# Patient Record
Sex: Male | Born: 1950
Health system: Southern US, Community
[De-identification: ages and names within clinical notes are randomized; demographics above are authoritative.]

## PROBLEM LIST (undated history)

## (undated) DIAGNOSIS — C801 Malignant (primary) neoplasm, unspecified: Secondary | ICD-10-CM

## (undated) DIAGNOSIS — G4730 Sleep apnea, unspecified: Secondary | ICD-10-CM

## (undated) DIAGNOSIS — I10 Essential (primary) hypertension: Secondary | ICD-10-CM

## (undated) DIAGNOSIS — J309 Allergic rhinitis, unspecified: Secondary | ICD-10-CM

## (undated) DIAGNOSIS — E119 Type 2 diabetes mellitus without complications: Secondary | ICD-10-CM

## (undated) HISTORY — PX: FRACTURE SURGERY: SHX138

## (undated) HISTORY — PX: OTHER SURGICAL HISTORY: SHX169

## (undated) HISTORY — DX: Sleep apnea, unspecified: G47.30

## (undated) HISTORY — DX: Allergic rhinitis, unspecified: J30.9

## (undated) HISTORY — PX: TONSILLECTOMY: SUR1361

## (undated) HISTORY — DX: Essential (primary) hypertension: I10

---

## 2006-10-09 LAB — HM COLONOSCOPY

## 2007-03-25 ENCOUNTER — Encounter: Payer: Self-pay | Admitting: Family Medicine

## 2007-08-24 ENCOUNTER — Ambulatory Visit: Payer: Self-pay | Admitting: Family Medicine

## 2007-08-24 DIAGNOSIS — J309 Allergic rhinitis, unspecified: Secondary | ICD-10-CM | POA: Insufficient documentation

## 2007-09-08 ENCOUNTER — Ambulatory Visit: Payer: Self-pay | Admitting: Family Medicine

## 2007-09-08 DIAGNOSIS — E1159 Type 2 diabetes mellitus with other circulatory complications: Secondary | ICD-10-CM | POA: Insufficient documentation

## 2007-09-08 DIAGNOSIS — I10 Essential (primary) hypertension: Secondary | ICD-10-CM

## 2007-09-08 LAB — CONVERTED CEMR LAB
Bilirubin Urine: NEGATIVE
Glucose, Urine, Semiquant: NEGATIVE
Ketones, urine, test strip: NEGATIVE
Specific Gravity, Urine: 1.015
Urobilinogen, UA: NEGATIVE
pH: 6.5

## 2007-09-18 ENCOUNTER — Ambulatory Visit: Payer: Self-pay | Admitting: Family Medicine

## 2007-09-25 ENCOUNTER — Ambulatory Visit: Payer: Self-pay | Admitting: Gastroenterology

## 2007-10-09 ENCOUNTER — Encounter: Payer: Self-pay | Admitting: Family Medicine

## 2007-10-09 ENCOUNTER — Encounter: Payer: Self-pay | Admitting: Gastroenterology

## 2007-10-09 ENCOUNTER — Ambulatory Visit: Payer: Self-pay | Admitting: Gastroenterology

## 2007-12-16 ENCOUNTER — Ambulatory Visit: Payer: Self-pay | Admitting: Family Medicine

## 2008-01-06 ENCOUNTER — Ambulatory Visit: Payer: Self-pay | Admitting: Pulmonary Disease

## 2008-01-17 ENCOUNTER — Ambulatory Visit (HOSPITAL_BASED_OUTPATIENT_CLINIC_OR_DEPARTMENT_OTHER): Admission: RE | Admit: 2008-01-17 | Discharge: 2008-01-17 | Payer: Self-pay | Admitting: Pulmonary Disease

## 2008-01-17 ENCOUNTER — Encounter: Payer: Self-pay | Admitting: Pulmonary Disease

## 2008-01-19 ENCOUNTER — Ambulatory Visit: Payer: Self-pay | Admitting: Pulmonary Disease

## 2008-01-21 ENCOUNTER — Telehealth: Payer: Self-pay | Admitting: Pulmonary Disease

## 2008-01-26 ENCOUNTER — Ambulatory Visit: Payer: Self-pay | Admitting: Pulmonary Disease

## 2008-01-26 ENCOUNTER — Ambulatory Visit: Payer: Self-pay | Admitting: Family Medicine

## 2008-01-26 DIAGNOSIS — G2589 Other specified extrapyramidal and movement disorders: Secondary | ICD-10-CM

## 2008-01-26 DIAGNOSIS — G4733 Obstructive sleep apnea (adult) (pediatric): Secondary | ICD-10-CM | POA: Insufficient documentation

## 2008-01-28 ENCOUNTER — Encounter: Payer: Self-pay | Admitting: Pulmonary Disease

## 2008-02-15 ENCOUNTER — Encounter: Payer: Self-pay | Admitting: Pulmonary Disease

## 2008-03-15 ENCOUNTER — Encounter: Payer: Self-pay | Admitting: Pulmonary Disease

## 2008-03-28 ENCOUNTER — Encounter (INDEPENDENT_AMBULATORY_CARE_PROVIDER_SITE_OTHER): Payer: Self-pay | Admitting: *Deleted

## 2008-03-30 ENCOUNTER — Ambulatory Visit: Payer: Self-pay | Admitting: Family Medicine

## 2008-06-16 ENCOUNTER — Ambulatory Visit: Payer: Self-pay | Admitting: Family Medicine

## 2008-06-20 ENCOUNTER — Ambulatory Visit: Payer: Self-pay | Admitting: Family Medicine

## 2008-06-20 LAB — CONVERTED CEMR LAB
ALT: 23 units/L (ref 0–53)
Albumin: 3.7 g/dL (ref 3.5–5.2)
Alkaline Phosphatase: 37 units/L — ABNORMAL LOW (ref 39–117)
BUN: 13 mg/dL (ref 6–23)
Bilirubin, Direct: 0.1 mg/dL (ref 0.0–0.3)
CO2: 29 meq/L (ref 19–32)
Calcium: 8.9 mg/dL (ref 8.4–10.5)
Cholesterol: 164 mg/dL (ref 0–200)
Glucose, Bld: 92 mg/dL (ref 70–99)
HDL: 34.8 mg/dL — ABNORMAL LOW (ref >39.0)
Sodium: 144 meq/L (ref 135–145)
Total Protein: 7.4 g/dL (ref 6.0–8.3)

## 2009-09-04 ENCOUNTER — Telehealth: Payer: Self-pay | Admitting: Family Medicine

## 2009-09-12 ENCOUNTER — Ambulatory Visit: Payer: Self-pay | Admitting: Family Medicine

## 2009-09-19 ENCOUNTER — Encounter: Payer: Self-pay | Admitting: Family Medicine

## 2009-09-19 ENCOUNTER — Ambulatory Visit: Payer: Self-pay | Admitting: Family Medicine

## 2009-09-19 DIAGNOSIS — R5383 Other fatigue: Secondary | ICD-10-CM

## 2009-09-19 DIAGNOSIS — R5381 Other malaise: Secondary | ICD-10-CM

## 2009-09-20 LAB — CONVERTED CEMR LAB
ALT: 21 units/L (ref 0–53)
AST: 24 units/L (ref 0–37)
Alkaline Phosphatase: 48 units/L (ref 39–117)
Basophils Relative: 0.7 % (ref 0.0–3.0)
Bilirubin, Direct: 0.1 mg/dL (ref 0.0–0.3)
Chloride: 110 meq/L (ref 96–112)
Cholesterol: 168 mg/dL (ref 0–200)
Eosinophils Absolute: 0.1 10*3/uL (ref 0.0–0.7)
LDL Cholesterol: 107 mg/dL — ABNORMAL HIGH (ref 0–99)
MCHC: 32.7 g/dL (ref 30.0–36.0)
MCV: 89.8 fL (ref 78.0–100.0)
Monocytes Absolute: 0.5 10*3/uL (ref 0.1–1.0)
Neutrophils Relative %: 49 % (ref 43.0–77.0)
PSA: 1.5 ng/mL (ref 0.10–4.00)
Platelets: 245 10*3/uL (ref 150.0–400.0)
Potassium: 4 meq/L (ref 3.5–5.1)
RDW: 12.4 % (ref 11.5–14.6)
Total Bilirubin: 0.6 mg/dL (ref 0.3–1.2)
Total CHOL/HDL Ratio: 3
Vitamin B-12: 349 pg/mL (ref 211–911)

## 2010-08-14 NOTE — Progress Notes (Signed)
Summary: call a nurse   Phone Note Call from Patient   Caller: Patient Call For: Kerby Nora MD Summary of Call: Triage Record Num: [id number] Operator: Karenann Cai Patient Name: Chase Moreno Call Date & Time: 09/02/2009 8:44:28AM Patient Phone: [phone] PCP: Kerby Nora Patient Gender: Male PCP Fax : Patient DOB: 1950-11-08 Practice Name: [facility] Reason for Call: Patient has flu-like symptoms: body aches, cough and fever. Fever: ? Onset of symptoms: 48 hours. RN reviewed care advise with patient and advised him to go to UC for evaluation this am. Protocol(s) Used: Flu-Like Symptoms; Known/Suspected Influenza Recommended Outcome per Protocol: See Provider within 4 hours Reason for Outcome: Known influenza outbreak and has had symptoms consistent with influenza less than 48 hours (sudden onset of generalized headache, muscles aching, generalized ill feeling, loss of appetite, nasal congestion, runny nose, sore throat or cough) Care Advice:  ~ Use a cool mist humidifier to moisten air. Be sure to clean according to manufacturer's instructions. Drink 6-10 eight ounce glasses (1.2-2.0 liters) of fluids per day unless previously instructed to restrict fluid intake for other medical reasons. Limit fluids that contain caffeine, sugar or alcohol.  ~ Consider acetaminophen as directed on label or by pharmacist/provider for pain or fever. PRECAUTIONS: - Use only If there is no history of liver disease, alcoholism, or intake of three or more alcohol drinks per day. - If approved by provider when breastfeeding. - Do not exceed recommended dose or frequency.  ~ Respiratory Hygiene: - Cover the nose/mouth tightly with a tissue when coughing or sneezing. - Use tissue 1 time and discard in the nearest waste receptacle. - Wash hands with soap and water or alcohol-based hand rub after coming into contact with respiratory secretions and contaminated objects/materials. -  Alternatively when no tissue is available, cough into the bend of the elbow.  ~ 02 Initial call taken by: Melody Comas,  September 04, 2009 10:49 AM

## 2010-08-14 NOTE — Assessment & Plan Note (Signed)
Summary: CPX.Marland KitchenMarland KitchenCYD   Vital Signs:  Patient profile:   60 year old male Height:      67 inches Weight:      188.8 pounds BMI:     29.68 Temp:     98.0 degrees F oral Pulse rate:   72 / minute Pulse rhythm:   regular BP sitting:   110 / 78  (left arm) Cuff size:   regular  Vitals Entered By: Benny Lennert CMA Duncan Dull) (September 12, 2009 11:28 AM)  History of Present Illness: Chief complaint cpx  The patient is here for annual wellness exam and preventative care.    Was not here because lost job, now [employer].   Third Shift job now.  Did have flu 2 weeks ago..since then has been tired.  Was given tamiflu though. eating and drinking well.   Hypertension History:      He denies headache, chest pain, dyspnea with exertion, peripheral edema, neurologic problems, syncope, and side effects from treatment.  Well controlled at home. Marland Kitchen        Positive major cardiovascular risk factors include male age 75 years old or older and hypertension.  Negative major cardiovascular risk factors include no history of diabetes or hyperlipidemia, negative family history for ischemic heart disease, and non-tobacco-user status.        Further assessment for target organ damage reveals no history of ASHD, cardiac end-organ damage (CHF/LVH), stroke/TIA, peripheral vascular disease, renal insufficiency, or hypertensive retinopathy.     Problems Prior to Update: 1)  Special Screening Malignant Neoplasm of Prostate  (ICD-V76.44) 2)  Periodic Limb Movement Disorder  (ICD-333.99) 3)  Obstructive Sleep Apnea  (ICD-780.57) 4)  Healthy Adult Male  (ICD-V70.0) 5)  Hypertension  (ICD-401.9) 6)  Allergic Rhinitis  (ICD-477.9)  Current Medications (verified): 1)  Hydrochlorothiazide 25 Mg  Tabs (Hydrochlorothiazide) .... Take 1 Tablet By Mouth Once A Day 2)  Adult Aspirin Ec Low Strength 81 Mg  Tbec (Aspirin) .... Take One Tablet Daily. 3)  Fish Oil   Oil (Fish Oil) .... Take One Tablet Daily. 4)  Multivitamins    Tabs (Multiple Vitamin) .... Take 1 Tablet By Mouth Once A Day  Allergies (verified): No Known Drug Allergies  Past History:  Past medical, surgical, family and social histories (including risk factors) reviewed, and no changes noted (except as noted below).  Past Medical History: Reviewed history from 01/26/2008 and no changes required. SLEEP APNEA, CHRONIC (ICD-780.57) HYPERTENSION (ICD-401.9) ALLERGIC RHINITIS (ICD-477.9)  Past Surgical History: Reviewed history from 08/24/2007 and no changes required. 1984 broken arm, left Tonsillectomy  Family History: Reviewed history from 08/24/2007 and no changes required. father: ? health mother:arhtritis, heart arrythmia raised by grandparents ZOX:WRUE at young age MGF:CVA age 27 uncle: colon cancer  Social History: Reviewed history from 09/08/2007 and no changes required. 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 CV:  Denies chest pain or discomfort. Resp:  Denies shortness of breath. GI:  Denies abdominal pain. GU:  Denies dysuria. Derm:  Denies lesion(s). Psych:  Denies anxiety and depression.  Physical Exam  General:  Overweight male in NAD Ears:  External ear exam shows no significant lesions or deformities.  Otoscopic examination reveals clear canals, tympanic membranes are intact bilaterally without bulging, retraction, inflammation or discharge. Hearing is grossly normal bilaterally. Nose:  External nasal examination shows no  deformity or inflammation. Nasal mucosa are pink and moist without lesions or exudates. Mouth:  Oral mucosa and oropharynx without lesions or exudates.  Teeth in good repair. MMM Neck:  no carotid bruit or thyromegaly no cervical or supraclavicular lymphadenopathy  Lungs:  Normal  respiratory effort, chest expands symmetrically. Lungs are clear to auscultation, no crackles or wheezes. Heart:  Normal rate and regular rhythm. S1 and S2 normal without gallop, murmur, click, rub or other extra sounds. Abdomen:  Bowel sounds positive,abdomen soft and non-tender without masses, organomegaly or hernias noted. Rectal:  No external abnormalities noted. Normal sphincter tone. No rectal masses or tenderness. Genitalia:  Testes bilaterally descended without nodularity, tenderness or masses. No scrotal masses or lesions. No penis lesions or urethral discharge. Msk:  No deformity or scoliosis noted of thoracic or lumbar spine.   Pulses:  R and L posterior tibial pulses are full and equal bilaterally  Extremities:  no edema Neurologic:  No cranial nerve deficits noted. Station and gait are normal. Plantar reflexes are down-going bilaterally. DTRs are symmetrical throughout. Sensory, motor and coordinative functions appear intact. Skin:  Intact without suspicious lesions or rashes Psych:  Cognition and judgment appear intact. Alert and cooperative with normal attention span and concentration. No apparent delusions, illusions, hallucinations   Impression & Recommendations:  Problem # 1:  HEALTHY ADULT MALE (ICD-V70.0) The patient's preventative maintenance and recommended screening tests for an annual wellness exam were reviewed in full today. Brought up to date unless services declined.  Counselled on the importance of diet, exercise, and its role in overall health and mortality. The patient's FH and SH was reviewed, including their home life, tobacco status, and drug and alcohol status.     Problem # 2:  HYPERTENSION (ICD-401.9)  Well controlled oncurrrent medication.  His updated medication list for this problem includes:    Hydrochlorothiazide 25 Mg Tabs (Hydrochlorothiazide) .Marland Kitchen... Take 1 tablet by mouth once a day  BP today: 110/78 Prior BP: 126/76 (06/20/2008)  10 Yr Risk  Heart Disease: 14 % Prior 10 Yr Risk Heart Disease: Not enough information (09/18/2007)  Labs Reviewed: K+: 4.2 (06/16/2008) Creat: : 1.1 (06/16/2008)   Chol: 164 (06/16/2008)   HDL: 34.8 (06/16/2008)   LDL: 114 (06/16/2008)   TG: 78 (06/16/2008)  Complete Medication List: 1)  Hydrochlorothiazide 25 Mg Tabs (Hydrochlorothiazide) .... Take 1 tablet by mouth once a day 2)  Adult Aspirin Ec Low Strength 81 Mg Tbec (Aspirin) .... Take one tablet daily. 3)  Fish Oil Oil (Fish oil) .... Take one tablet daily. 4)  Multivitamins Tabs (Multiple vitamin) .... Take 1 tablet by mouth once a day  Hypertension Assessment/Plan:      The patient's hypertensive risk group is category B: At least one risk factor (excluding diabetes) with no target organ damage.  His calculated 10 year risk of coronary heart disease is 14 %.  Today's blood pressure is 110/78.  His blood pressure goal is < 140/90.  Patient Instructions: 1)  Fasting lipids, CMET Dx 401.1, PSA v76.44, cbc B12, vit D Dx 780.79  SOON 2)  Please schedule a follow-up appointment in1 year.    Current Allergies (reviewed today): No known allergies   Flex Sig Next Due:  Not Indicated Hemoccult Next Due:  Not Indicated

## 2010-11-27 NOTE — Procedures (Signed)
Chase Moreno, BOUGHNER NO.:  [id number]   MEDICAL RECORD NO.:  [id number]          PATIENT TYPE:  OUT   LOCATION:  SLEEP CENTER                 FACILITY:  [facility]   PHYSICIAN:  Oretha Milch, MD      DATE OF BIRTH:  Jan 01, 1951   DATE OF STUDY:  01/17/2008                            NOCTURNAL POLYSOMNOGRAM   REFERRING PHYSICIAN:   INDICATION FOR STUDY:  Mr. Chase Moreno is a 60 year old African-American  gentleman with loud snoring, witnessed apneas, and narrow pharyngeal  exam.   EPWORTH SLEEPINESS SCORE:  His Epworth sleepiness score was 4/24.   He weighed 192 pounds with a height of 5 feet 7 inches and a BMI of 30.  He had a neck size of 18 inches.   This overnight polysomnogram was performed in a split study with a sleep  technologist in attendance. EEG, EOG, EMG, EKG and respiratory data were  recorded. Sleep stages, arousals, limb movements and respiratory  parameters were scored according to the criteria laid out by the  American Academy of Sleep Medicine.   SLEEP ARCHITECTURE:  Lights out was at 9:53 p.m. CPAP titration was  initiated at 11:56 p.m. Lights on was at 5:18 a.m.   During the baseline diagnostic portion, total sleep time was 120 minutes  with a sleep period time of 120.5 minutes and sleep efficiency of 98%.  Sleep latency was 2 minutes, and latency to REM sleep was 40 minutes.  __________ percentage of total sleep time was N1 0.8%, N2 78.3%, N3 0%,  and REM sleep 20.8% (25 minutes). He slept supine for 82.5 minutes.   During the titration portion, REM sleep was noted for 88 minutes.  Longest period of REM sleep was around 2:15 a.m.   RESPIRATORY DATA:  During the diagnostic portion, he had 29 obstructive  apneas, 0 central apneas, 1 mixed apnea, and 16 hypopneas with an  apnea/hypopnea index of 23 events per hour. Most of the events were  noted during REM sleep with a REM-related AHI of 81.6 events per hour.  The longest apnea was 29.9 seconds  and the longest hypopnea was 29.4  seconds.   Due to this degree of respiratory disturbance, CPAP was initiated at 4  cm. Due to intolerance to higher levels of CPAP, BiPAP was initiated at  10/6 cm and titrated to 24/19 cm due to snoring and respiratory events.   At a BiPAP level of 11/7 cm for 16.5 minutes of sleep, no events were  noted. At a level of 17/13 for 54 minutes, he achieved 53.5 minutes of  sleep with 15.5 minutes of REM sleep; 3 obstructive apneas were noted  with an AHI of 3.4 events per hour with 2 arousals and lowest  desaturation of 91%. At a level of 22/19 cm for 16.5 minutes of sleep, 4  obstructive apneas were noted with an AHI of 14.5 events per hour and 6  arousals.   At a level of 24/19 cm for 26.5 minutes of sleep including 21 minutes of  REM sleep, 3 obstructive apneas were noted with an AHI of 6.8 events per  hour. This appears to be  the optimal level.   OXYGEN SATURATION DATA:  The lowest desaturation was 77% during the  diagnostic portion. On 17/13 cm, the lowest desaturation was 91%. On  24/19 cm, the lowest desaturation was 93%.   PERIODIC LIMB MOVEMENTS:  During the baseline portion, periodic limb  movement was 97 events per hour. However, these were not associated with  arousal. Limb movements seemed to be somewhat corrected at the higher  levels of BiPAP.   CARDIAC DATA:  During the diagnostic portion, the lowest heart rate was  65 beats per minute during non-REM sleep, and the high heart rate was  101 beats per minute. No arrhythmias were noted.   DISCUSSION:  A large size Mirage Quattro mask was used, and the patient  was desensitized to this. Predominant REM-related events were noted. He  did not tolerate higher levels of CPAP. Hence, BiPAP was initiated.  Optimal level of BiPAP was 24/19; however, quite a few arousals were  noted at this level.   IMPRESSION:  1. Severe obstructive sleep apnea with hypopneas, predominantly during      REM  causing arousals and sleep fragmentation.  2. This was corrected by an optimal BiPAP level of 24/19 cm.  3. Significant periodic limb movements were noted. However, these were      not associated with arousals.  4. No evidence of cardiac arrhythmias or behavioral disturbance during      sleep.   RECOMMENDATIONS:  1. I would initiate BiPAP with a full-face Mirage Quattro large size      mask at 17/13 cm and titrate to an optimal level of 24/19 cm based      on tolerance with heat and humidity.  2. At this time, I would not treat the periodic limb movements since      they did not seem to be associated with arousal unless clinically      indicated.  3. The patient was asked to avoid driving when sleepy and avoid      medications with sedative side effects.      Oretha Milch, MD  Electronically Signed     RVA/MEDQ  D:  01/19/2008 15:41:22  T:  01/19/2008 16:45:09  Job:  [id number]

## 2010-12-28 ENCOUNTER — Telehealth: Payer: Self-pay | Admitting: Family Medicine

## 2010-12-28 DIAGNOSIS — I10 Essential (primary) hypertension: Secondary | ICD-10-CM

## 2010-12-28 NOTE — Telephone Encounter (Signed)
Message copied by Excell Seltzer on Fri Dec 28, 2010  5:49 PM ------      Message from: Baldomero Lamy      Created: Thu Dec 20, 2010  9:41 AM      Regarding: Cpx labs 6/19       Please order  future cpx labs for pt's upcomming lab appt.      Thanks      Rodney Booze

## 2011-01-01 ENCOUNTER — Other Ambulatory Visit: Payer: Self-pay

## 2011-01-02 ENCOUNTER — Other Ambulatory Visit (INDEPENDENT_AMBULATORY_CARE_PROVIDER_SITE_OTHER): Payer: Managed Care, Other (non HMO)

## 2011-01-02 DIAGNOSIS — I10 Essential (primary) hypertension: Secondary | ICD-10-CM

## 2011-01-02 LAB — COMPREHENSIVE METABOLIC PANEL
AST: 21 U/L (ref 0–37)
Alkaline Phosphatase: 57 U/L (ref 39–117)
BUN: 15 mg/dL (ref 6–23)
Creatinine, Ser: 1.1 mg/dL (ref 0.4–1.5)
Glucose, Bld: 91 mg/dL (ref 70–99)
Total Bilirubin: 0.4 mg/dL (ref 0.3–1.2)

## 2011-01-02 LAB — LIPID PANEL
Cholesterol: 197 mg/dL (ref 0–200)
HDL: 47.3 mg/dL (ref >39.00)
LDL Cholesterol: 131 mg/dL — ABNORMAL HIGH (ref 0–99)
Total CHOL/HDL Ratio: 4
Triglycerides: 94 mg/dL (ref 0.0–149.0)
VLDL: 18.8 mg/dL (ref 0.0–40.0)

## 2011-01-04 ENCOUNTER — Encounter: Payer: Self-pay | Admitting: Family Medicine

## 2011-01-08 ENCOUNTER — Encounter: Payer: Self-pay | Admitting: Family Medicine

## 2011-01-08 ENCOUNTER — Ambulatory Visit (INDEPENDENT_AMBULATORY_CARE_PROVIDER_SITE_OTHER): Payer: PRIVATE HEALTH INSURANCE | Admitting: Family Medicine

## 2011-01-08 VITALS — BP 112/80 | HR 63 | Temp 98.4°F | Ht 67.0 in | Wt 190.8 lb

## 2011-01-08 DIAGNOSIS — I10 Essential (primary) hypertension: Secondary | ICD-10-CM

## 2011-01-08 MED ORDER — HYDROCHLOROTHIAZIDE 25 MG PO TABS: 25.0000 mg | ORAL_TABLET | Freq: Every day | ORAL | Status: DC

## 2011-01-08 NOTE — Patient Instructions (Signed)
Consider restarting fish oil. Work on low cholesterol diet. Decrease cheese, eggs, cake, ice cream etc. Continue blood pressure medicine.

## 2011-01-08 NOTE — Assessment & Plan Note (Signed)
Well controlled. Continue current medication.  

## 2011-01-08 NOTE — Progress Notes (Signed)
Subjective:    Patient ID: Chase Moreno, male    DOB: Apr 27, 1951, 60 y.o.   MRN: [id number]  HPI  The patient is here for annual wellness exam and preventative care.    Hypertension:  Well controlled on HCTZ,   Using medication without problems or lightheadedness: None Chest pain with exertion:None Edema:None Short of breath:None Average home BPs:Not checking at home Other issues: Noted blister on right ankle.. Rubbed with boots.   Cholesterol almost at goal LDL<130.  HDL and triglycerides good.   LAbs reviewed in detail with pt  Review of Systems  Constitutional: Negative for fever, fatigue and unexpected weight change.  HENT: Negative for ear pain, congestion, sore throat, rhinorrhea, trouble swallowing and postnasal drip.   Eyes: Negative for pain.  Respiratory: Negative for cough, shortness of breath and wheezing.   Cardiovascular: Negative for chest pain, palpitations and leg swelling.  Gastrointestinal: Negative for nausea, abdominal pain, diarrhea, constipation and blood in stool.  Genitourinary: Negative for dysuria, urgency, hematuria, discharge, penile swelling, scrotal swelling, difficulty urinating, penile pain and testicular pain.  Skin: Negative for rash.  Neurological: Negative for syncope, weakness, light-headedness, numbness and headaches.  Psychiatric/Behavioral: Negative for behavioral problems and dysphoric mood. The patient is not nervous/anxious.        Objective:   Physical Exam  Constitutional: He appears well-developed and well-nourished.  Non-toxic appearance. He does not appear ill. No distress.  HENT:  Head: Normocephalic and atraumatic.  Right Ear: Hearing, tympanic membrane, external ear and ear canal normal.  Left Ear: Hearing, tympanic membrane, external ear and ear canal normal.  Nose: Nose normal.  Mouth/Throat: Uvula is midline, oropharynx is clear and moist and mucous membranes are normal.  Eyes: Conjunctivae, EOM and lids are normal.  Pupils are equal, round, and reactive to light. No foreign bodies found.  Neck: Trachea normal, normal range of motion and phonation normal. Neck supple. Carotid bruit is not present. No mass and no thyromegaly present.  Cardiovascular: Normal rate, regular rhythm, S1 normal, S2 normal, intact distal pulses and normal pulses.  Exam reveals no gallop.   No murmur heard. Pulmonary/Chest: Breath sounds normal. He has no wheezes. He has no rhonchi. He has no rales.  Abdominal: Soft. Normal appearance and bowel sounds are normal. There is no hepatosplenomegaly. There is no tenderness. There is no rebound, no guarding and no CVA tenderness. No hernia. Hernia confirmed negative in the right inguinal area and confirmed negative in the left inguinal area.  Genitourinary: Rectum normal, prostate normal, testes normal and penis normal. Rectal exam shows no external hemorrhoid, no internal hemorrhoid, no fissure, no mass, no tenderness and anal tone normal. Prostate is not enlarged and not tender. Right testis shows no mass and no tenderness. Left testis shows no mass and no tenderness. No paraphimosis or penile tenderness.  Lymphadenopathy:    He has no cervical adenopathy.       Right: No inguinal adenopathy present.       Left: No inguinal adenopathy present.  Neurological: He is alert. He has normal strength and normal reflexes. No cranial nerve deficit or sensory deficit. Gait normal.  Skin: Skin is warm, dry and intact. No rash noted.  Psychiatric: He has a normal mood and affect. His speech is normal and behavior is normal. Judgment normal.          Assessment & Plan:  Complete Physical Exam: The patient's preventative maintenance and recommended screening tests for an annual wellness exam were reviewed in full today.  Brought up to date unless services declined.  Counselled on the importance of diet, exercise, and its role in overall health and mortality. The patient's FH and SH was reviewed,  including their home life, tobacco status, and drug and alcohol status.   PSA stable.

## 2012-02-18 ENCOUNTER — Other Ambulatory Visit: Payer: Self-pay | Admitting: Family Medicine

## 2012-02-23 ENCOUNTER — Telehealth: Payer: Self-pay | Admitting: Family Medicine

## 2012-02-23 DIAGNOSIS — Z125 Encounter for screening for malignant neoplasm of prostate: Secondary | ICD-10-CM

## 2012-02-23 DIAGNOSIS — I10 Essential (primary) hypertension: Secondary | ICD-10-CM

## 2012-02-23 NOTE — Telephone Encounter (Signed)
Message copied by Excell Seltzer on Sun Feb 23, 2012 11:01 PM ------      Message from: Alvina Chou      Created: Tue Feb 18, 2012  3:58 PM      Regarding: Lab orders for , Monday,8.12.13       Patient is scheduled for CPX labs, please order future labs, Thanks , Camelia Eng

## 2012-02-24 ENCOUNTER — Other Ambulatory Visit: Payer: Self-pay

## 2012-02-24 ENCOUNTER — Other Ambulatory Visit (INDEPENDENT_AMBULATORY_CARE_PROVIDER_SITE_OTHER): Payer: PRIVATE HEALTH INSURANCE

## 2012-02-24 DIAGNOSIS — Z125 Encounter for screening for malignant neoplasm of prostate: Secondary | ICD-10-CM

## 2012-02-24 DIAGNOSIS — I10 Essential (primary) hypertension: Secondary | ICD-10-CM

## 2012-02-24 LAB — LDL CHOLESTEROL, DIRECT: Direct LDL: 169.8 mg/dL

## 2012-02-24 LAB — COMPREHENSIVE METABOLIC PANEL
ALT: 22 U/L (ref 0–53)
AST: 24 U/L (ref 0–37)
Albumin: 4.1 g/dL (ref 3.5–5.2)
Calcium: 9.2 mg/dL (ref 8.4–10.5)
Chloride: 103 mEq/L (ref 96–112)
Potassium: 3.7 mEq/L (ref 3.5–5.1)

## 2012-02-24 LAB — PSA: PSA: 2.76 ng/mL (ref 0.10–4.00)

## 2012-02-24 NOTE — Telephone Encounter (Signed)
Pt said [facility] [address] has no refill on HCTZ 25 mg. Spoke with Lurena Joiner at [facility] HCTZ is ready for pick up. Pt will go to [facility].

## 2012-02-28 ENCOUNTER — Encounter: Payer: Self-pay | Admitting: Family Medicine

## 2012-02-28 ENCOUNTER — Ambulatory Visit (INDEPENDENT_AMBULATORY_CARE_PROVIDER_SITE_OTHER): Payer: PRIVATE HEALTH INSURANCE | Admitting: Family Medicine

## 2012-02-28 VITALS — BP 120/80 | HR 60 | Temp 97.6°F | Ht 66.0 in | Wt 187.0 lb

## 2012-02-28 DIAGNOSIS — E7800 Pure hypercholesterolemia, unspecified: Secondary | ICD-10-CM

## 2012-02-28 DIAGNOSIS — R972 Elevated prostate specific antigen [PSA]: Secondary | ICD-10-CM

## 2012-02-28 DIAGNOSIS — I10 Essential (primary) hypertension: Secondary | ICD-10-CM

## 2012-02-28 DIAGNOSIS — E785 Hyperlipidemia, unspecified: Secondary | ICD-10-CM | POA: Insufficient documentation

## 2012-02-28 DIAGNOSIS — Z0000 Encounter for general adult medical examination without abnormal findings: Secondary | ICD-10-CM

## 2012-02-28 DIAGNOSIS — E1169 Type 2 diabetes mellitus with other specified complication: Secondary | ICD-10-CM | POA: Insufficient documentation

## 2012-02-28 NOTE — Assessment & Plan Note (Signed)
Re-eval in 6 months with free and total levels. No abnormality on rectal exam and asymptomatic.

## 2012-02-28 NOTE — Progress Notes (Signed)
Subjective:    Patient ID: Chase Moreno, male    DOB: 1950/12/31, 61 y.o.   MRN: [id number]  HPI The patient is here for annual wellness exam and preventative care.    Hypertension:  Well controlled on HCTZ  Using medication without problems or lightheadedness: None Chest pain with exertion:None Edema:None Short of breath:None Average home BPs:Not checking Other issues:  Wt Readings from Last 3 Encounters:  02/28/12 187 lb (84.823 kg)  01/08/11 190 lb 12.8 oz (86.546 kg)  09/12/09 188 lb 12.8 oz (85.639 kg)   New diagnosis high cholesterol. Lab Results  Component Value Date   CHOL 240* 02/24/2012   HDL 53.80 02/24/2012   LDLCALC 131* 01/02/2011   LDLDIRECT 169.8 02/24/2012   TRIG 95.0 02/24/2012   CHOLHDL 4 02/24/2012  Diet compliance: Moderate Exercise:walk 1-3 times a week Other complaints:       Review of Systems  Constitutional: Negative for fever, fatigue and unexpected weight change.  HENT: Negative for ear pain, congestion, sore throat, rhinorrhea, trouble swallowing and postnasal drip.   Eyes: Negative for pain.  Respiratory: Negative for cough, shortness of breath and wheezing.   Cardiovascular: Negative for chest pain, palpitations and leg swelling.  Gastrointestinal: Negative for nausea, abdominal pain, diarrhea, constipation and blood in stool.  Genitourinary: Negative for dysuria, urgency, hematuria, discharge, penile swelling, scrotal swelling, difficulty urinating, penile pain and testicular pain.  Skin: Negative for rash.  Neurological: Negative for syncope, weakness, light-headedness, numbness and headaches.  Psychiatric/Behavioral: Negative for behavioral problems and dysphoric mood. The patient is not nervous/anxious.        Objective:   Physical Exam  Constitutional: He appears well-developed and well-nourished.  Non-toxic appearance. He does not appear ill. No distress.  HENT:  Head: Normocephalic and atraumatic.  Right Ear: Hearing, tympanic  membrane, external ear and ear canal normal.  Left Ear: Hearing, tympanic membrane, external ear and ear canal normal.  Nose: Nose normal.  Mouth/Throat: Uvula is midline, oropharynx is clear and moist and mucous membranes are normal.  Eyes: Conjunctivae, EOM and lids are normal. Pupils are equal, round, and reactive to light. No foreign bodies found.  Neck: Trachea normal, normal range of motion and phonation normal. Neck supple. Carotid bruit is not present. No mass and no thyromegaly present.  Cardiovascular: Normal rate, regular rhythm, S1 normal, S2 normal, intact distal pulses and normal pulses.  Exam reveals no gallop.   No murmur heard. Pulmonary/Chest: Breath sounds normal. He has no wheezes. He has no rhonchi. He has no rales.  Abdominal: Soft. Normal appearance and bowel sounds are normal. There is no hepatosplenomegaly. There is no tenderness. There is no rebound, no guarding and no CVA tenderness. No hernia. Hernia confirmed negative in the right inguinal area and confirmed negative in the left inguinal area.  Genitourinary: Prostate normal, testes normal and penis normal. Rectal exam shows no external hemorrhoid, no internal hemorrhoid, no fissure, no mass, no tenderness and anal tone normal. Guaiac negative stool. Prostate is not enlarged and not tender. Right testis shows no mass and no tenderness. Left testis shows no mass and no tenderness. No paraphimosis or penile tenderness.  Lymphadenopathy:    He has no cervical adenopathy.       Right: No inguinal adenopathy present.       Left: No inguinal adenopathy present.  Neurological: He is alert. He has normal strength and normal reflexes. No cranial nerve deficit or sensory deficit. Gait normal.  Skin: Skin is warm, dry and intact.  No rash noted.  Psychiatric: He has a normal mood and affect. His speech is normal and behavior is normal. Judgment normal.          Assessment & Plan:  The patient's preventative maintenance and  recommended screening tests for an annual wellness exam were reviewed in full today. Brought up to date unless services declined.  Counselled on the importance of diet, exercise, and its role in overall health and mortality. The patient's FH and SH was reviewed, including their home life, tobacco status, and drug and alcohol status.   Vaccines: uptodate with Td, due for shingles vaccine Colon: 10/09/2007 Dr. Russella Dar, polyps, likely repeat in 5 year. Prostate:Increased almost 1 point in last year.. Will recheck in 6 months with free t3 and t4. Lab Results  Component Value Date   PSA 2.76 02/24/2012   PSA 1.49 01/02/2011   PSA 1.50 09/19/2009

## 2012-02-28 NOTE — Assessment & Plan Note (Signed)
Info given. Encouraged exercise, weight loss, healthy eating habits.  

## 2012-02-28 NOTE — Patient Instructions (Addendum)
Return for repeat PSA check in 6 months.  Work on low cholesterol diet, exercise and weight loss. Call insurance about shingles vaccine if you are interested.

## 2012-02-28 NOTE — Assessment & Plan Note (Signed)
Well controlled. Continue current medication.  

## 2012-06-26 ENCOUNTER — Other Ambulatory Visit: Payer: Self-pay | Admitting: Family Medicine

## 2012-08-25 ENCOUNTER — Encounter: Payer: Self-pay | Admitting: Family Medicine

## 2012-08-25 ENCOUNTER — Ambulatory Visit (INDEPENDENT_AMBULATORY_CARE_PROVIDER_SITE_OTHER): Payer: PRIVATE HEALTH INSURANCE | Admitting: Family Medicine

## 2012-08-25 VITALS — BP 130/90 | HR 73 | Temp 97.7°F | Ht 66.0 in | Wt 195.0 lb

## 2012-08-25 DIAGNOSIS — H6123 Impacted cerumen, bilateral: Secondary | ICD-10-CM

## 2012-08-25 DIAGNOSIS — H6120 Impacted cerumen, unspecified ear: Secondary | ICD-10-CM

## 2012-08-25 NOTE — Progress Notes (Signed)
  Subjective:    Patient ID: Chase Moreno, male    DOB: 03-21-1951, 62 y.o.   MRN: [id number]  Otalgia  There is pain in both ears. Chronicity: hx of cerumen impaction. The current episode started 1 to 4 weeks ago. The problem occurs constantly. There has been no fever. The pain is moderate. Pertinent negatives include no coughing, diarrhea, ear discharge, headaches, hearing loss or sore throat. Associated symptoms comments: Ears felt closed up  decreased hearing. Treatments tried: ear wax drops. The treatment provided mild relief. There is no history of a chronic ear infection, hearing loss or a tympanostomy tube.      Review of Systems  HENT: Positive for ear pain. Negative for hearing loss, sore throat and ear discharge.   Respiratory: Negative for cough.   Gastrointestinal: Negative for diarrhea.  Neurological: Negative for headaches.       Objective:   Physical Exam  Constitutional: Vital signs are normal. He appears well-developed and well-nourished.  Non-toxic appearance. He does not appear ill. No distress.  HENT:  Head: Normocephalic and atraumatic.  Right Ear: Hearing, tympanic membrane, external ear and ear canal normal. No tenderness. No foreign bodies. Tympanic membrane is not retracted and not bulging.  Left Ear: Hearing, tympanic membrane, external ear and ear canal normal. No tenderness. No foreign bodies. Tympanic membrane is not retracted and not bulging.  Nose: Nose normal. No mucosal edema or rhinorrhea.  Mouth/Throat: Uvula is midline, oropharynx is clear and moist and mucous membranes are normal. Normal dentition. No dental caries. No oropharyngeal exudate or tonsillar abscesses.  Initially wax impaction B ... Exam listed is post irrigation  Eyes: Conjunctivae, EOM and lids are normal. Pupils are equal, round, and reactive to light. No foreign bodies found.  Neck: Trachea normal, normal range of motion and phonation normal. Neck supple. Carotid bruit is not present.  No mass and no thyromegaly present.  Cardiovascular: Normal rate, regular rhythm, S1 normal, S2 normal, normal heart sounds, intact distal pulses and normal pulses.  Exam reveals no gallop.   No murmur heard. Pulmonary/Chest: Effort normal and breath sounds normal. No respiratory distress. He has no wheezes. He has no rhonchi. He has no rales.  Abdominal: Soft. Normal appearance and bowel sounds are normal. There is no hepatosplenomegaly. There is no tenderness. There is no rebound, no guarding and no CVA tenderness. No hernia.  Neurological: He is alert. He has normal reflexes.  Psychiatric: He has a normal mood and affect. His speech is normal and behavior is normal. Judgment normal.          Assessment & Plan:  Ears irrigated with resolution of symptoms.

## 2012-08-31 ENCOUNTER — Other Ambulatory Visit: Payer: PRIVATE HEALTH INSURANCE

## 2012-09-15 ENCOUNTER — Encounter: Payer: Self-pay | Admitting: Gastroenterology

## 2012-09-17 ENCOUNTER — Encounter: Payer: Self-pay | Admitting: Family Medicine

## 2012-09-17 ENCOUNTER — Ambulatory Visit (INDEPENDENT_AMBULATORY_CARE_PROVIDER_SITE_OTHER): Payer: PRIVATE HEALTH INSURANCE | Admitting: Family Medicine

## 2012-09-17 VITALS — BP 140/90 | HR 95 | Temp 99.0°F | Ht 66.0 in | Wt 195.0 lb

## 2012-09-17 DIAGNOSIS — J069 Acute upper respiratory infection, unspecified: Secondary | ICD-10-CM

## 2012-09-17 MED ORDER — BENZONATATE 100 MG PO CAPS: 100.0000 mg | ORAL_CAPSULE | Freq: Three times a day (TID) | ORAL | Status: DC | PRN

## 2012-09-17 NOTE — Progress Notes (Signed)
   Patient Name: Chase Moreno Date of Birth: 1950/10/01 Medical Record Number: [id number]  History of Present Illness:  Patent presents with 3-4 runny nose, sneezing, cough, sore throat, malaise and minimal / low-grade fever .   ? recent exposure to others with similar symptoms.   The patent denies sore throat as the primary complaint. Denies sthortness of breath/wheezing, high fever, chest pain, rhinits for more than 14 days, significant myalgia, otalgia, facial pain, abdominal pain, changes in bowel or bladder.  PMH, PHS, Allergies, Problem List, Medications, Family History, and Social History have all been reviewed.  Patient Active Problem List  Diagnosis  . PERIODIC LIMB MOVEMENT DISORDER  . HYPERTENSION  . ALLERGIC RHINITIS  . OBSTRUCTIVE SLEEP APNEA  . High cholesterol  . Abnormal PSA    Past Medical History  Diagnosis Date  . Unspecified sleep apnea   . Unspecified essential hypertension   . Allergic rhinitis, cause unspecified     Past Surgical History  Procedure Laterality Date  . Tonsillectomy    . Fracture surgery      History   Social History  . Marital Status: Widowed    Spouse Name: N/A    Number of Children: N/A  . Years of Education: N/A   Occupational History  . [employer]   Social History Main Topics  . Smoking status: Never Smoker   . Smokeless tobacco: Not on file  . Alcohol Use: No  . Drug Use: No  . Sexually Active: Not on file   Other Topics Concern  . Not on file   Social History Narrative   Regular exercise---yes, rabbit hunting, walking 2 times  A week.      Diet---eating fruit and veggies, limiting meat.      Widowed: Remarried.             Family History  Problem Relation Age of Onset  . Arthritis Mother   . Arrhythmia Mother   . Stroke Paternal Grandmother     No Known Allergies  Current Outpatient Prescriptions on File Prior to Visit  Medication Sig Dispense Refill  . hydrochlorothiazide  (HYDRODIURIL) 25 MG tablet TAKE ONE TABLET BY MOUTH EVERY DAY  90 tablet  0   No current facility-administered medications on file prior to visit.    Review of Systems: as above, eating and drinking - tolerating PO. Urinating normally. No excessive vomitting or diarrhea. O/w as above.  Physical Exam:  Filed Vitals:   09/17/12 1501  BP: 140/90  Pulse: 95  Temp: 99 F (37.2 C)  TempSrc: Oral  Height: 5\' 6"  (1.676 m)  Weight: 195 lb (88.451 kg)  SpO2: 97%    GEN: WDWN, Non-toxic, Atraumatic, normocephalic. A and O x 3. HEENT: Oropharynx clear without exudate, MMM, no significant LAD, mild rhinnorhea Ears: TM clear, COL visualized with good landmarks CV: RRR, no m/g/r. Pulm: CTA B, no wheezes, rhonchi, or crackles, normal respiratory effort. EXT: no c/c/e Psych: well oriented, neither depressed nor anxious in appearance  A/P: 1. URI. Supportive care reviewed with patient. See patient instruction section.  Meds ordered this encounter  Medications  . benzonatate (TESSALON) 100 MG capsule    Sig: Take 1 capsule (100 mg total) by mouth 3 (three) times daily as needed for cough.    Dispense:  50 capsule    Refill:  1

## 2012-11-02 ENCOUNTER — Other Ambulatory Visit: Payer: Self-pay | Admitting: Family Medicine

## 2013-03-05 ENCOUNTER — Other Ambulatory Visit: Payer: Self-pay | Admitting: Family Medicine

## 2013-07-13 ENCOUNTER — Telehealth: Payer: Self-pay

## 2013-07-13 MED ORDER — HYDROCHLOROTHIAZIDE 25 MG PO TABS: ORAL_TABLET | ORAL | Status: DC

## 2013-07-13 NOTE — Telephone Encounter (Signed)
Pt request refill HCTZ to [facility]. Pt scheduled appt to see Dr Ermalene Searing 07/16/13.pt will pick up rx later today.

## 2013-07-16 ENCOUNTER — Encounter: Payer: Self-pay | Admitting: Family Medicine

## 2013-07-16 ENCOUNTER — Ambulatory Visit (INDEPENDENT_AMBULATORY_CARE_PROVIDER_SITE_OTHER): Payer: PRIVATE HEALTH INSURANCE | Admitting: Family Medicine

## 2013-07-16 VITALS — BP 124/68 | HR 79 | Temp 98.2°F | Ht 67.0 in | Wt 202.5 lb

## 2013-07-16 DIAGNOSIS — I10 Essential (primary) hypertension: Secondary | ICD-10-CM

## 2013-07-16 DIAGNOSIS — E7800 Pure hypercholesterolemia, unspecified: Secondary | ICD-10-CM

## 2013-07-16 MED ORDER — HYDROCHLOROTHIAZIDE 25 MG PO TABS: ORAL_TABLET | ORAL | Status: DC

## 2013-07-16 NOTE — Patient Instructions (Addendum)
Schedule CPX with fasting labs prior in next several months. Work on exercise, weight loss, healthy eating habits.

## 2013-07-16 NOTE — Assessment & Plan Note (Signed)
Well controlled. Continue current medication.  

## 2013-07-16 NOTE — Progress Notes (Signed)
63 year old male presents for med refills, overdue for CPX.  Hypertension: Well controlled on HCTZ  Using medication without problems or lightheadedness: None  Chest pain with exertion:None  Edema:None  Short of breath:None  Average home BPs:Not checking  Other issues:  BP Readings from Last 3 Encounters:  07/16/13 124/68  09/17/12 140/90  08/25/12 130/90    Wt Readings from Last 3 Encounters:  07/16/13 202 lb 8 oz (91.853 kg)  09/17/12 195 lb (88.451 kg)  08/25/12 195 lb (88.451 kg)     High cholesterol, due for re-eval. Has labs done at work.. Not sure where records are. Lab Results  Component Value Date   CHOL 240* 02/24/2012   HDL 53.80 02/24/2012   LDLCALC 131* 01/02/2011   LDLDIRECT 169.8 02/24/2012   TRIG 95.0 02/24/2012   CHOLHDL 4 02/24/2012  Diet compliance: Moderate  Exercise:walk 1-3 times a week  Other complaints:    Review of Systems  Constitutional: Negative for fever, fatigue and unexpected weight change.  HENT: Negative for ear pain, congestion, sore throat, rhinorrhea, trouble swallowing and postnasal drip.  Eyes: Negative for pain.  Respiratory: Negative for cough, shortness of breath and wheezing.  Cardiovascular: Negative for chest pain, palpitations and leg swelling.  Gastrointestinal: Negative for nausea, abdominal pain, diarrhea, constipation and blood in stool.  Genitourinary: Negative for dysuria, urgency, hematuria, discharge, penile swelling, scrotal swelling, difficulty urinating, penile pain and testicular pain.  Skin: Negative for rash.  Neurological: Negative for syncope, weakness, light-headedness, numbness and headaches.  Psychiatric/Behavioral: Negative for behavioral problems and dysphoric mood. The patient is not nervous/anxious.  Objective:   Physical Exam  Constitutional: He appears well-developed and well-nourished. Non-toxic appearance. He does not appear ill. No distress.  HENT:  Head: Normocephalic and atraumatic.  Right Ear:  Hearing, tympanic membrane, external ear and ear canal normal.  Left Ear: Hearing, tympanic membrane, external ear and ear canal normal.  Nose: Nose normal.  Mouth/Throat: Uvula is midline, oropharynx is clear and moist and mucous membranes are normal.  Eyes: Conjunctivae, EOM and lids are normal. Pupils are equal, round, and reactive to light. No foreign bodies found.  Neck: Trachea normal, normal range of motion and phonation normal. Neck supple. Carotid bruit is not present. No mass and no thyromegaly present.  Cardiovascular: Normal rate, regular rhythm, S1 normal, S2 normal, intact distal pulses and normal pulses. Exam reveals no gallop.  No murmur heard.  Pulmonary/Chest: Breath sounds normal. He has no wheezes. He has no rhonchi. He has no rales.  Abdominal: Soft. Normal appearance and bowel sounds are normal. There is no hepatosplenomegaly. There is no tenderness. There is no rebound, no guarding and no CVA tenderness. No hernia. Hernia confirmed negative in the right inguinal area and confirmed negative in the left inguinal area.  Genitourinary: Prostate normal, testes normal and penis normal. Rectal exam shows no external hemorrhoid, no internal hemorrhoid, no fissure, no mass, no tenderness and anal tone normal. Guaiac negative stool. Prostate is not enlarged and not tender. Right testis shows no mass and no tenderness. Left testis shows no mass and no tenderness. No paraphimosis or penile tenderness.  Lymphadenopathy:  He has no cervical adenopathy.  Right: No inguinal adenopathy present.  Left: No inguinal adenopathy present.  Neurological: He is alert. He has normal strength and normal reflexes. No cranial nerve deficit or sensory deficit. Gait normal.  Skin: Skin is warm, dry and intact. No rash noted.  Psychiatric: He has a normal mood and affect. His speech  is normal and behavior is normal. Judgment normal.

## 2013-07-16 NOTE — Progress Notes (Signed)
Pre-visit discussion using our clinic review tool. No additional management support is needed unless otherwise documented below in the visit note.  

## 2013-07-16 NOTE — Assessment & Plan Note (Signed)
Due for re-eval. Encouraged exercise, weight loss, healthy eating habits.  

## 2013-10-10 ENCOUNTER — Telehealth: Payer: Self-pay | Admitting: Family Medicine

## 2013-10-10 DIAGNOSIS — E7800 Pure hypercholesterolemia, unspecified: Secondary | ICD-10-CM

## 2013-10-10 DIAGNOSIS — R972 Elevated prostate specific antigen [PSA]: Secondary | ICD-10-CM

## 2013-10-10 NOTE — Telephone Encounter (Signed)
Message copied by Jinny Sanders on Sun Oct 10, 2013 10:18 PM ------      Message from: Selinda Orion J      Created: Mon Oct 04, 2013 12:22 PM      Regarding: Lab orders for Monday,3.30.15       Patient is scheduled for CPX labs, please order future labs, Thanks , Terri       ------

## 2013-10-11 ENCOUNTER — Other Ambulatory Visit (INDEPENDENT_AMBULATORY_CARE_PROVIDER_SITE_OTHER): Payer: PRIVATE HEALTH INSURANCE

## 2013-10-11 DIAGNOSIS — R972 Elevated prostate specific antigen [PSA]: Secondary | ICD-10-CM

## 2013-10-11 DIAGNOSIS — E7800 Pure hypercholesterolemia, unspecified: Secondary | ICD-10-CM

## 2013-10-11 LAB — LIPID PANEL
CHOL/HDL RATIO: 5
Cholesterol: 206 mg/dL — ABNORMAL HIGH (ref 0–200)
HDL: 44.9 mg/dL (ref >39.00)
LDL Cholesterol: 142 mg/dL — ABNORMAL HIGH (ref 0–99)
Triglycerides: 96 mg/dL (ref 0.0–149.0)
VLDL: 19.2 mg/dL (ref 0.0–40.0)

## 2013-10-11 LAB — COMPREHENSIVE METABOLIC PANEL
ALT: 23 U/L (ref 0–53)
AST: 31 U/L (ref 0–37)
Albumin: 4.1 g/dL (ref 3.5–5.2)
Alkaline Phosphatase: 51 U/L (ref 39–117)
BILIRUBIN TOTAL: 0.3 mg/dL (ref 0.3–1.2)
BUN: 16 mg/dL (ref 6–23)
CHLORIDE: 104 meq/L (ref 96–112)
CO2: 32 mEq/L (ref 19–32)
CREATININE: 1.2 mg/dL (ref 0.4–1.5)
Calcium: 9.3 mg/dL (ref 8.4–10.5)
GFR: 79.24 mL/min (ref >60.00)
Glucose, Bld: 100 mg/dL — ABNORMAL HIGH (ref 70–99)
Potassium: 4.6 mEq/L (ref 3.5–5.1)
SODIUM: 139 meq/L (ref 135–145)
Total Protein: 7.6 g/dL (ref 6.0–8.3)

## 2013-10-12 LAB — PSA, TOTAL AND FREE
PSA FREE PCT: 11 % — AB (ref >25)
PSA, Free: 0.37 ng/mL
PSA: 3.29 ng/mL (ref <=4.00)

## 2013-10-21 ENCOUNTER — Encounter: Payer: Self-pay | Admitting: Family Medicine

## 2013-10-21 ENCOUNTER — Telehealth: Payer: Self-pay | Admitting: Family Medicine

## 2013-10-21 ENCOUNTER — Ambulatory Visit (INDEPENDENT_AMBULATORY_CARE_PROVIDER_SITE_OTHER): Payer: PRIVATE HEALTH INSURANCE | Admitting: Family Medicine

## 2013-10-21 VITALS — BP 120/80 | HR 60 | Temp 97.4°F | Ht 66.0 in | Wt 193.2 lb

## 2013-10-21 DIAGNOSIS — G4730 Sleep apnea, unspecified: Secondary | ICD-10-CM | POA: Insufficient documentation

## 2013-10-21 DIAGNOSIS — I10 Essential (primary) hypertension: Secondary | ICD-10-CM

## 2013-10-21 DIAGNOSIS — Z0000 Encounter for general adult medical examination without abnormal findings: Secondary | ICD-10-CM

## 2013-10-21 DIAGNOSIS — E7800 Pure hypercholesterolemia, unspecified: Secondary | ICD-10-CM

## 2013-10-21 DIAGNOSIS — R972 Elevated prostate specific antigen [PSA]: Secondary | ICD-10-CM

## 2013-10-21 NOTE — Assessment & Plan Note (Signed)
Referral to sleep specialist.

## 2013-10-21 NOTE — Progress Notes (Signed)
The patient is here for annual wellness exam and preventative care.   He is doing well overall.   He has noted some tenderness in right heel at times, ongoing for 2 -3 months. Stands on feet a lot at night.  First when getting up in AM or after sitting, area is painful and tight.  Hypertension: Well controlled on HCTZ  BP Readings from Last 3 Encounters:  10/21/13 120/80  07/16/13 124/68  09/17/12 140/90  Using medication without problems or lightheadedness: None  Chest pain with exertion:None  Edema:None  Short of breath:None  Average home BPs:Not checking  Other issues:  Wt Readings from Last 3 Encounters:  10/21/13 193 lb 4 oz (87.658 kg)  07/16/13 202 lb 8 oz (91.853 kg)  09/17/12 195 lb (88.451 kg)   He snores at night, occ stops breathing at night.  He request sleep apnea evaluation.  High cholesterol, but improved from last check. LDL not at goal < 130.  refuses medication at this time. Lab Results  Component Value Date   CHOL 206* 10/11/2013   HDL 44.90 10/11/2013   LDLCALC 142* 10/11/2013   LDLDIRECT 169.8 02/24/2012   TRIG 96.0 10/11/2013   CHOLHDL 5 10/11/2013  Diet compliance: Moderate  Exercise:walk 1-3 times a week  Other complaints:   Review of Systems  Constitutional: Negative for fever, fatigue and unexpected weight change.  HENT: Negative for ear pain, congestion, sore throat, rhinorrhea, trouble swallowing and postnasal drip.  Eyes: Negative for pain.  Respiratory: Negative for cough, shortness of breath and wheezing.  Cardiovascular: Negative for chest pain, palpitations and leg swelling.  Gastrointestinal: Negative for nausea, abdominal pain, diarrhea, constipation and blood in stool.  Genitourinary: Negative for dysuria, urgency, hematuria, discharge, penile swelling, scrotal swelling, difficulty urinating, penile pain and testicular pain.  Skin: Negative for rash.  Neurological: Negative for syncope, weakness, light-headedness, numbness and  headaches.  Psychiatric/Behavioral: Negative for behavioral problems and dysphoric mood. The patient is not nervous/anxious.  Objective:   Physical Exam  Constitutional: He appears well-developed and well-nourished. Non-toxic appearance. He does not appear ill. No distress.  HENT:  Head: Normocephalic and atraumatic.  Right Ear: Hearing, tympanic membrane, external ear and ear canal normal.  Left Ear: Hearing, tympanic membrane, external ear and ear canal normal.  Nose: Nose normal.  Mouth/Throat: Uvula is midline, oropharynx is clear and moist and mucous membranes are normal.  Eyes: Conjunctivae, EOM and lids are normal. Pupils are equal, round, and reactive to light. No foreign bodies found.  Neck: Trachea normal, normal range of motion and phonation normal. Neck supple. Carotid bruit is not present. No mass and no thyromegaly present.  Cardiovascular: Normal rate, regular rhythm, S1 normal, S2 normal, intact distal pulses and normal pulses. Exam reveals no gallop.  No murmur heard.  Pulmonary/Chest: Breath sounds normal. He has no wheezes. He has no rhonchi. He has no rales.  Abdominal: Soft. Normal appearance and bowel sounds are normal. There is no hepatosplenomegaly. There is no tenderness. There is no rebound, no guarding and no CVA tenderness. No hernia. Hernia confirmed negative in the right inguinal area and confirmed negative in the left inguinal area.  Genitourinary: Prostate normal, testes normal and penis normal. Rectal exam shows no external hemorrhoid, no internal hemorrhoid, no fissure, no mass, no tenderness and anal tone normal. Guaiac negative stool. Prostate is not enlarged and not tender. Right testis shows no mass and no tenderness. Left testis shows no mass and no tenderness. No paraphimosis or penile tenderness.  Lymphadenopathy:  He has no cervical adenopathy.  Right: No inguinal adenopathy present.  Left: No inguinal adenopathy present.  Neurological: He is alert. He  has normal strength and normal reflexes. No cranial nerve deficit or sensory deficit. Gait normal.  Skin: Skin is warm, dry and intact. No rash noted.  Psychiatric: He has a normal mood and affect. His speech is normal and behavior is normal. Judgment normal.  Assessment & Plan:   The patient's preventative maintenance and recommended screening tests for an annual wellness exam were reviewed in full today.  Brought up to date unless services declined.  Counselled on the importance of diet, exercise, and its role in overall health and mortality.  The patient's FH and SH was reviewed, including their home life, tobacco status, and drug and alcohol status.   Vaccines: uptodate with Td, due for shingles vaccine  Colon: 10/09/2007 Dr. Fuller Plan, polyps,  Repeat due in 10 years.  Prostate Continuing to trend upwards...  Large percent free. Lab Results  Component Value Date   PSA 3.29 10/11/2013   PSA 2.76 02/24/2012   PSA 1.49 01/02/2011

## 2013-10-21 NOTE — Assessment & Plan Note (Signed)
Trending up and low free percentage.Marland Kitchen Refer to urologist.

## 2013-10-21 NOTE — Telephone Encounter (Signed)
Relevant patient education mailed to patient.  

## 2013-10-21 NOTE — Assessment & Plan Note (Signed)
Well controlled. Continue current medication.  

## 2013-10-21 NOTE — Assessment & Plan Note (Signed)
Improving but inadequate control. Pt refuses medication. Encouraged exercise, weight loss, healthy eating habits. Recheck in 6 months.

## 2013-10-21 NOTE — Progress Notes (Signed)
Pre visit review using our clinic review tool, if applicable. No additional management support is needed unless otherwise documented below in the visit note. 

## 2013-10-21 NOTE — Patient Instructions (Addendum)
Stop at front desk to set up sleep specialist referral for sleep apnea evaluation and urologist. Continue working on weight loss and healthy eating. Continue walking. Check into shingles vaccine coverage.  Follow up in 6 months with fasting labs prior to re-eval cholesterol.

## 2013-11-15 ENCOUNTER — Ambulatory Visit (INDEPENDENT_AMBULATORY_CARE_PROVIDER_SITE_OTHER): Payer: PRIVATE HEALTH INSURANCE | Admitting: Family Medicine

## 2013-11-15 ENCOUNTER — Encounter: Payer: Self-pay | Admitting: Family Medicine

## 2013-11-15 VITALS — BP 118/82 | HR 93 | Temp 98.5°F | Ht 67.25 in | Wt 191.2 lb

## 2013-11-15 DIAGNOSIS — J0100 Acute maxillary sinusitis, unspecified: Secondary | ICD-10-CM

## 2013-11-15 MED ORDER — AMOXICILLIN-POT CLAVULANATE 875-125 MG PO TABS: 1.0000 | ORAL_TABLET | Freq: Two times a day (BID) | ORAL | Status: DC

## 2013-11-15 NOTE — Patient Instructions (Signed)
TREATMENT THAT HELPS WITH SYMPTOMS: 1. Drink plenty of fluids, but limit caffeine  3. Nasal Sprays: Relieve pressure, promote drainage, open nasal and ear passages. Afrin can be used for only 3-4 days in row.  4. Cough Suppressants: Delsym is an example of a cough suppressant. It lasts for 12 hours. Strongest over the counter.   6. Expectorants: Liquify secretions and improve drainage Mucinex is a 12 hour expectorant. (Extended release guaifenesin)  For cheaper expectorant or Mucinex alternative, get generic guaifenesin  MUCINEX 2 TABLETS TWICE A DAY  THESE WILL MAKE YOU FEEL BETTER FOR A WHILE, SO YOU CAN DEAL WITH YOUR SYMPTOMS. YOUR BODY HAS TO HEAL ITSELF.  Ask a pharmacist for help if you cannot find these medications, and they will help you.

## 2013-11-15 NOTE — Progress Notes (Signed)
Pre visit review using our clinic review tool, if applicable. No additional management support is needed unless otherwise documented below in the visit note. 

## 2013-11-15 NOTE — Progress Notes (Signed)
[place] [address] Phone: [phone] Fax: [phone]  Patient ID: Chase Moreno MRN: [id number], DOB: [birth year], 63 y.o. Date of Encounter: 11/15/2013  Primary Physician:  Eliezer Lofts, MD   Chief Complaint: Sinus Problem   Subjective:   History of Present Illness:  This 63 y.o. male patient presents with runny nose, sneezing, cough, sore throat, malaise and minimal / low-grade fever for > 1 week. Now the primary complaint has become sinus pressure and pain behind the eyes and in the upper, anterior face.   Dry cough, every now and then will cough up some phlegm.   The patent denies sore throat as the primary complaint. Denies sthortness of breath/wheezing, high fever, chest pain, significant myalgia, otalgia, abdominal pain, changes in bowel or bladder.  PMH, PHS, Allergies, Problem List, Medications, Family History, and Social History have all been reviewed.  Patient Active Problem List   Diagnosis Date Noted  . Sleep apnea 10/21/2013  . High cholesterol 02/28/2012  . Abnormal PSA 02/28/2012  . PERIODIC LIMB MOVEMENT DISORDER 01/26/2008  . OBSTRUCTIVE SLEEP APNEA 01/26/2008  . HYPERTENSION 09/08/2007  . ALLERGIC RHINITIS 08/24/2007    Past Medical History  Diagnosis Date  . Unspecified sleep apnea   . Unspecified essential hypertension   . Allergic rhinitis, cause unspecified     Past Surgical History  Procedure Laterality Date  . Tonsillectomy    . Fracture surgery      History   Social History  . Marital Status: Widowed    Spouse Name: N/A    Number of Children: N/A  . Years of Education: N/A   Occupational History  . [employer] History Main Topics  . Smoking status: Never Smoker   . Smokeless tobacco: Never Used  . Alcohol Use: No  . Drug Use: No  . Sexual Activity: Not on file   Other Topics Concern  . Not on file   Social History Narrative   Regular exercise---yes, rabbit hunting, walking  2 times  A week.      Diet---eating fruit and veggies, limiting meat.      Widowed: Remarried.             Family History  Problem Relation Age of Onset  . Arthritis Mother   . Arrhythmia Mother   . Stroke Paternal Grandmother     No Known Allergies  Medication list reviewed and updated in full in [facility].  Review of Systems: as above, eating and drinking - tolerating PO. Urinating normally. No excessive vomitting or diarrhea. O/w as above.  Objective:   Physical Exam:  Filed Vitals:   11/15/13 0905  BP: 118/82  Pulse: 93  Temp: 98.5 F (36.9 C)  TempSrc: Oral  Height: 5' 7.25" (1.708 m)  Weight: 191 lb 4 oz (86.75 kg)  SpO2: 96%    GEN: WDWN, Non-toxic, Atraumatic, normocephalic. A and O x 3. HEENT: Oropharynx clear without exudate, MMM, no significant LAD, mild rhinnorhea Sinuses: Right Frontal, ethmoid, and maxillary: Tender, max Left Frontal, Ethmoid, and maxillary: Tender, max Ears: TM clear, COL visualized with good landmarks CV: RRR, no m/g/r. Pulm: CTA B, no wheezes, rhonchi, or crackles, normal respiratory effort. EXT: no c/c/e Psych: well oriented, neither depressed nor anxious in appearance  Assessment & Plan:   Maxillary sinusitis, acute  Acute sinusitis: ABX as below.   Reviewed symptomatic care as well as ABX in this case.    New  Prescriptions   AMOXICILLIN-CLAVULANATE (AUGMENTIN) 875-125 MG PER TABLET    Take 1 tablet by mouth 2 (two) times daily.    Follow-up: No Follow-up on file. Unless noted above, the patient is to follow-up if symptoms worsen. Red flags were reviewed with the patient.  Patient Instructions Reviewed: SINUSITIS Sinuses are cavities in facial skeleton that drain to nose. Impaired drainage and obstruction of sinus passages main cause.  Treatment: 1. Take all Antibiotics 2. Open nasal and sinus canals: Oral decongestant: Sudafed. (CAUTION IF HIGH BLOOD PRESSURE) 3. Steam inhalation 4. Humidifier in  room 5. Frequent nasal saline irrigation 6. Moist heat compresses to face 7. Tylenol or Ibuprofen for pain and fever, follow directions on bottle. 8. MUCINEX 2 TABLETS TWICE A DAY TO LIQUIFY MUCOUS 9. Nasal Afrin 2 sprays each nostril twice a day for no more than 4 days in a row. (Your nose can get addicted)  Signed,  Frederico Hamman T. Marygrace Sandoval, MD, [place]   Patient's Medications  New Prescriptions   AMOXICILLIN-CLAVULANATE (AUGMENTIN) 875-125 MG PER TABLET    Take 1 tablet by mouth 2 (two) times daily.  Previous Medications   HYDROCHLOROTHIAZIDE (HYDRODIURIL) 25 MG TABLET    TAKE ONE TABLET BY MOUTH ONCE DAILY  Modified Medications   No medications on file  Discontinued Medications   No medications on file

## 2013-12-13 ENCOUNTER — Encounter: Payer: Self-pay | Admitting: Pulmonary Disease

## 2013-12-13 ENCOUNTER — Encounter (INDEPENDENT_AMBULATORY_CARE_PROVIDER_SITE_OTHER): Payer: Self-pay

## 2013-12-13 ENCOUNTER — Ambulatory Visit (INDEPENDENT_AMBULATORY_CARE_PROVIDER_SITE_OTHER): Payer: PRIVATE HEALTH INSURANCE | Admitting: Pulmonary Disease

## 2013-12-13 VITALS — BP 122/74 | HR 81 | Temp 97.7°F | Ht 67.0 in | Wt 197.6 lb

## 2013-12-13 DIAGNOSIS — G4730 Sleep apnea, unspecified: Secondary | ICD-10-CM

## 2013-12-13 NOTE — Assessment & Plan Note (Signed)
Sleep lab [id number] for mask fitting Titration study to recalibrate your machine settings Weight loss encouraged, compliance with goal of at least 4-6 hrs every night is the expectation. Advised against medications with sedative side effects Cautioned against driving when sleepy - understanding that sleepiness will vary on a day to day basis

## 2013-12-13 NOTE — Patient Instructions (Signed)
Sleep lab [id number] for mask fitting Titration study to recalibrate your machine settings

## 2013-12-13 NOTE — Progress Notes (Signed)
Subjective:    Patient ID: Chase Moreno, male    DOB: 20-Jan-1951, 63 y.o.   MRN: [id number]  HPI  63 year old with moderately severe obstructive sleep apnea  Chief Complaint  Patient presents with  . Sleep consult    last seen 2009. Pt has not used CPAP x 1 year. C/o very loud snoring.     He was last seen in 2009 PSG7/5/09 >> severe REM related OSA with AHI of 23 per hour and REM AHI of 81 per hour , corrected by BiPAP 24/19, large mirage quattro mask. PLMs ++ partially corrected with BiPAP.  BiPAP initiated at 17/13  Use this for 2-3 years and then stopped. He states that he had a problem with mask fitting.  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 His wife reports loud snoring and excessive daytime fatigue and somnolence. Epworth sleepiness score is 11. Bedtime is anywhere from 7 to 11 AM, and he naps for about 2-3 hours, he then is awake through the afternoon and naps again from 7 to 11 PM before going into work. Sleep latency is minimal, sleeps on his back with 2 pillows, and denies frequent awakenings. His weight is mostly unchanged since 2009. His wife has noted loud snoring and witnessed apneas  Past Medical History  Diagnosis Date  . Unspecified sleep apnea   . Unspecified essential hypertension   . Allergic rhinitis, cause unspecified    Past Surgical History  Procedure Laterality Date  . Tonsillectomy    . Fracture surgery      No Known Allergies  History   Social History  . Marital Status: Widowed    Spouse Name: N/A    Number of Children: 2  . Years of Education: N/A   Occupational History  . [place] History Main Topics  . Smoking status: Never Smoker   . Smokeless tobacco: Never Used  . Alcohol Use: No  . Drug Use: No  . Sexual Activity: Not on file   Other Topics Concern  . Not on file   Social History Narrative    Regular exercise---yes, rabbit hunting, walking 2 times  A week.      Diet---eating fruit and veggies, limiting meat.      Widowed: Remarried.             Family History  Problem Relation Age of Onset  . Arthritis Mother   . Arrhythmia Mother   . Stroke Paternal Grandmother       Review of Systems  Constitutional: Negative for fever and unexpected weight change.  HENT: Negative for congestion, dental problem, ear pain, nosebleeds, postnasal drip, rhinorrhea, sinus pressure, sneezing, sore throat and trouble swallowing.   Eyes: Negative for redness and itching.  Respiratory: Negative for cough, chest tightness, shortness of breath and wheezing.   Cardiovascular: Negative for palpitations and leg swelling.  Gastrointestinal: Negative for nausea and vomiting.  Genitourinary: Negative for dysuria.  Musculoskeletal: Negative for joint swelling.  Skin: Negative for rash.  Neurological: Negative for headaches.  Hematological: Does not bruise/bleed easily.  Psychiatric/Behavioral: Negative for dysphoric mood. The patient is not nervous/anxious.        Objective:   Physical Exam  Gen. Pleasant, obese, in no distress, normal affect ENT - no lesions, no post nasal drip, class 2-3 airway Neck: No JVD, no thyromegaly,  no carotid bruits Lungs: no use of accessory muscles, no dullness to percussion, decreased without rales or rhonchi  Cardiovascular: Rhythm regular, heart sounds  normal, no murmurs or gallops, no peripheral edema Abdomen: soft and non-tender, no hepatosplenomegaly, BS normal. Musculoskeletal: No deformities, no cyanosis or clubbing Neuro:  alert, non focal, no tremors       Assessment & Plan:

## 2013-12-31 ENCOUNTER — Encounter (HOSPITAL_BASED_OUTPATIENT_CLINIC_OR_DEPARTMENT_OTHER): Payer: PRIVATE HEALTH INSURANCE

## 2014-01-24 ENCOUNTER — Ambulatory Visit (INDEPENDENT_AMBULATORY_CARE_PROVIDER_SITE_OTHER): Payer: BC Managed Care – PPO | Admitting: Adult Health

## 2014-01-24 ENCOUNTER — Encounter: Payer: Self-pay | Admitting: Adult Health

## 2014-01-24 VITALS — BP 136/74 | HR 63 | Temp 97.3°F | Ht 67.0 in | Wt 199.0 lb

## 2014-01-24 DIAGNOSIS — G4730 Sleep apnea, unspecified: Secondary | ICD-10-CM

## 2014-01-24 NOTE — Patient Instructions (Signed)
Follow up for Sleep study this week as planned  Wear BIPAP each night  Follow up Dr. Elsworth Soho  In 3 months and As needed

## 2014-01-24 NOTE — Assessment & Plan Note (Signed)
Continue on BIPAP At bedtime   Follow up for sleep study as planned  follow up 3 month and As needed

## 2014-01-24 NOTE — Progress Notes (Signed)
   Subjective:    Patient ID: Chase Moreno, male    DOB: Apr 13, 1951, 63 y.o.   MRN: [id number]  HPI  63 year old with moderately severe obstructive sleep apnea   He was last seen in 2009 PSG7/5/09 >> severe REM related OSA with AHI of 23 per hour and REM AHI of 81 per hour , corrected by BiPAP 24/19, large mirage quattro mask. PLMs ++ partially corrected with BiPAP.  BiPAP initiated at 17/13  Use this for 2-3 years and then stopped. He states that he had a problem with mask fitting.  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 His wife reports loud snoring and excessive daytime fatigue and somnolence. Epworth sleepiness score is 11. Bedtime is anywhere from 7 to 11 AM, and he naps for about 2-3 hours, he then is awake through the afternoon and naps again from 7 to 11 PM before going into work. Sleep latency is minimal, sleeps on his back with 2 pillows, and denies frequent awakenings. His weight is mostly unchanged since 2009. His wife has noted loud snoring and witnessed apneas   01/24/2014 Follow up OSA  Returns for 6 week follow up for OSA.  Wears BIPAP 4-5days per week, x3-4hrs.  Discussed compliance.  Had new mask fitting. new mask and pressure setting doing well.  Wearing full face mask  Has repeat sleep study scheduled for 7/19.  Feels rested during daytime,denies daytime sleepiness. No naps.       Review of Systems  Constitutional: Negative for fever and unexpected weight change.  HENT: Negative for congestion, dental problem, ear pain, nosebleeds, postnasal drip, rhinorrhea, sinus pressure, sneezing, sore throat and trouble swallowing.   Eyes: Negative for redness and itching.  Respiratory: Negative for cough, chest tightness, shortness of breath and wheezing.   Cardiovascular: Negative for palpitations and leg swelling.  Gastrointestinal: Negative for nausea and vomiting.    Genitourinary: Negative for dysuria.  Musculoskeletal: Negative for joint swelling.  Skin: Negative for rash.  Neurological: Negative for headaches.  Hematological: Does not bruise/bleed easily.  Psychiatric/Behavioral: Negative for dysphoric mood. The patient is not nervous/anxious.        Objective:   Physical Exam  Gen. Pleasant, obese, in no distress, normal affect ENT - no lesions, no post nasal drip, class 2-3 airway Neck: No JVD, no thyromegaly, no carotid bruits Lungs: no use of accessory muscles, no dullness to percussion, decreased without rales or rhonchi  Cardiovascular: Rhythm regular, heart sounds  normal, no murmurs or gallops, no peripheral edema Abdomen: soft and non-tender, no hepatosplenomegaly, BS normal. Musculoskeletal: No deformities, no cyanosis or clubbing Neuro:  alert, non focal, no tremors       Assessment & Plan:

## 2014-01-26 NOTE — Progress Notes (Signed)
Reviewed & agree with plan  

## 2014-01-30 ENCOUNTER — Ambulatory Visit (HOSPITAL_BASED_OUTPATIENT_CLINIC_OR_DEPARTMENT_OTHER): Payer: BC Managed Care – PPO | Attending: Pulmonary Disease | Admitting: Radiology

## 2014-01-30 VITALS — Ht 67.0 in | Wt 195.0 lb

## 2014-01-30 DIAGNOSIS — G4761 Periodic limb movement disorder: Secondary | ICD-10-CM | POA: Insufficient documentation

## 2014-01-30 DIAGNOSIS — G4730 Sleep apnea, unspecified: Secondary | ICD-10-CM

## 2014-01-30 DIAGNOSIS — G4733 Obstructive sleep apnea (adult) (pediatric): Secondary | ICD-10-CM

## 2014-01-31 ENCOUNTER — Telehealth: Payer: Self-pay | Admitting: Pulmonary Disease

## 2014-01-31 DIAGNOSIS — G4730 Sleep apnea, unspecified: Secondary | ICD-10-CM

## 2014-01-31 DIAGNOSIS — G4710 Hypersomnia, unspecified: Secondary | ICD-10-CM

## 2014-01-31 NOTE — Sleep Study (Signed)
[place]  NAME: Chase Moreno  DATE OF BIRTH: 10-Oct-1950  MEDICAL RECORD NUMBER [id number]  LOCATION: [facility]  PHYSICIAN: Jeralynn Vaquera V.  DATE OF STUDY: 01/30/14   SLEEP STUDY TYPE: CPAP titration study               REFERRING PHYSICIAN: Rigoberto Noel, MD  INDICATION FOR STUDY: 63 year old with severe sleep apnea diagnosed by polysomnogram in 7/thousand 9 corrected by BiPAP 24/19 cm. He was maintained on BiPAP 17/13 cm and stopped using this. This repeat titration was scheduled to be calibrate his machine settings. At the time of this study ,they weighed 195 pounds with a height of  5 ft 7 inches and the BMI of 31, neck size of 17 inches. Epworth sleepiness score was 12   This CPAP titration polysomnogram was performed with a sleep technologist in attendance. EEG, EOG,EMG and respiratory parameters recorded. Sleep stages, arousals, limb movements and respiratory data was scored according to criteria laid out by the American Academy of sleep medicine.  SLEEP ARCHITECTURE: Lights out was at 2233 PM and lights on was at 507 AM. Total sleep time was 382 minutes with sleep period time of 391 minutes and sleep efficiency of 97% .Sleep latency was 2.5 minutes with latency to REM sleep of 39 minutes and wake after sleep onset of 9 minutes.  Sleep stages as a percentage of total sleep time was N1 -4.6 %,N2- 59.5 % and REM sleep 36 % ( 137 minutes) . The longest period of REM sleep was around 12 AM.   AROUSAL DATA : There were 23 arousals with an arousal index of 3.6 events per hour. Of these 17 were spontaneous, and 3 were associated with respiratory events and 2 were associated periodic limb movements  RESPIRATORY DATA: CPAP was initiated at 5 centimeters and titrated to a final level of BiPAP of 21/60 centimeters due to respiratory events and snoring. BiPAP was used due to high pressure requirement and persistence of events on lower pressures. At the  final level of 21/16 centimeters, there were 0 obstructive apneas, 1 central apneas, 1 mixed apneas and 0 hypopneas with apnea -hypopnea index of 2.5 events per hour.  There was no relation to sleep stage or body position. Titration was optimal.  MOVEMENT/PARASOMNIA: There were 152 PLMS with a PLM index of 24 events per hour. The PLM arousal index was 0.30 events per hour.  OXYGEN DATA: The lowest desaturation was 79 % during REM sleep and the desaturation index was 5 per hour. The saturations stayed below 88% for 1 minutes.  CARDIAC DATA: The low heart rate was 35 beats per minute. The high heart rate recorded was an artifact. No arrhythmias were noted   IMPRESSION :  1. severe obstructive sleep apnea with hypopneas causing sleep fragmentation and mild oxygen desaturation. 2. This was corrected by BiPAP of 21/16 centimeters with a large full face mask. Titration was optimal. 3. No evidence of cardiac arrhythmias or behavioral disturbance during sleep. 4. Periodic limb movements were not noted.  RECOMMENDATION:    1. The treatment options for this degree of sleep disordered breathing includes weight loss and BiPAP therapy. BiPAP can be initiated at 21 or 16 centimeters with a large full face mask and compliance monitored at this level. 2. Patient should be cautioned against driving when sleepy 3. They should be asked to avoid medications with sedative side effects  Rigoberto Noel  MD Diplomate, American Board of Sleep Medicine  ELECTRONICALLY SIGNED ON:  01/31/2014  [facility] PH: [phone]   FX: [phone] [place]

## 2014-01-31 NOTE — Telephone Encounter (Signed)
Bipap 21/16 required during titration study as final pressure. I believe he is currently on 17/13 Await down load on current settings before making changes

## 2014-02-01 NOTE — Telephone Encounter (Signed)
LMTCB x1 w/ spouse Pt never called back with name of his DME

## 2014-02-03 NOTE — Telephone Encounter (Signed)
[facility] PT NA. Line rang several times and no answer. wcb

## 2014-02-04 NOTE — Telephone Encounter (Signed)
ATC PT NA. Line rang several times and no answer wcb

## 2014-02-07 NOTE — Telephone Encounter (Signed)
Called spoke with pt. He uses [facility] Pt had titration study done last night. Will make [facility] aware

## 2014-02-08 ENCOUNTER — Encounter: Payer: Self-pay | Admitting: Gastroenterology

## 2014-03-03 ENCOUNTER — Encounter: Payer: Self-pay | Admitting: Gastroenterology

## 2014-04-17 ENCOUNTER — Telehealth: Payer: Self-pay | Admitting: Family Medicine

## 2014-04-17 DIAGNOSIS — I10 Essential (primary) hypertension: Secondary | ICD-10-CM

## 2014-04-17 DIAGNOSIS — E7800 Pure hypercholesterolemia, unspecified: Secondary | ICD-10-CM

## 2014-04-17 DIAGNOSIS — R972 Elevated prostate specific antigen [PSA]: Secondary | ICD-10-CM

## 2014-04-17 NOTE — Telephone Encounter (Signed)
Message copied by Jinny Sanders on Sun Apr 17, 2014 11:32 PM ------      Message from: Ellamae Sia      Created: Wed Apr 13, 2014  5:02 PM      Regarding: Lab orders for Monday, 10.5.15       Labs for f/u ------

## 2014-04-18 ENCOUNTER — Other Ambulatory Visit (INDEPENDENT_AMBULATORY_CARE_PROVIDER_SITE_OTHER): Payer: BC Managed Care – PPO

## 2014-04-18 DIAGNOSIS — E7800 Pure hypercholesterolemia, unspecified: Secondary | ICD-10-CM

## 2014-04-18 DIAGNOSIS — R972 Elevated prostate specific antigen [PSA]: Secondary | ICD-10-CM

## 2014-04-18 LAB — COMPREHENSIVE METABOLIC PANEL
ALT: 16 U/L (ref 0–53)
AST: 28 U/L (ref 0–37)
Albumin: 4.1 g/dL (ref 3.5–5.2)
Alkaline Phosphatase: 55 U/L (ref 39–117)
BILIRUBIN TOTAL: 0.7 mg/dL (ref 0.2–1.2)
BUN: 17 mg/dL (ref 6–23)
CALCIUM: 9.2 mg/dL (ref 8.4–10.5)
CHLORIDE: 105 meq/L (ref 96–112)
CO2: 23 mEq/L (ref 19–32)
CREATININE: 1.2 mg/dL (ref 0.4–1.5)
GFR: 81.48 mL/min (ref >60.00)
GLUCOSE: 147 mg/dL — AB (ref 70–99)
Potassium: 3.9 mEq/L (ref 3.5–5.1)
Sodium: 141 mEq/L (ref 135–145)
Total Protein: 7.7 g/dL (ref 6.0–8.3)

## 2014-04-18 LAB — LIPID PANEL
CHOLESTEROL: 210 mg/dL — AB (ref 0–200)
HDL: 46.9 mg/dL (ref >39.00)
LDL Cholesterol: 145 mg/dL — ABNORMAL HIGH (ref 0–99)
NONHDL: 163.1
Total CHOL/HDL Ratio: 4
Triglycerides: 89 mg/dL (ref 0.0–149.0)
VLDL: 17.8 mg/dL (ref 0.0–40.0)

## 2014-04-19 LAB — PSA, TOTAL AND FREE
PSA FREE PCT: 10 % — AB (ref >25)
PSA, Free: 0.45 ng/mL
PSA: 4.71 ng/mL — AB (ref <=4.00)

## 2014-04-22 ENCOUNTER — Encounter: Payer: Self-pay | Admitting: Family Medicine

## 2014-04-22 ENCOUNTER — Ambulatory Visit (INDEPENDENT_AMBULATORY_CARE_PROVIDER_SITE_OTHER): Payer: BC Managed Care – PPO | Admitting: Family Medicine

## 2014-04-22 VITALS — BP 110/70 | HR 61 | Temp 97.6°F | Ht 67.0 in | Wt 196.5 lb

## 2014-04-22 DIAGNOSIS — E7800 Pure hypercholesterolemia, unspecified: Secondary | ICD-10-CM

## 2014-04-22 DIAGNOSIS — R972 Elevated prostate specific antigen [PSA]: Secondary | ICD-10-CM

## 2014-04-22 DIAGNOSIS — E1159 Type 2 diabetes mellitus with other circulatory complications: Secondary | ICD-10-CM | POA: Insufficient documentation

## 2014-04-22 DIAGNOSIS — I10 Essential (primary) hypertension: Secondary | ICD-10-CM

## 2014-04-22 DIAGNOSIS — E119 Type 2 diabetes mellitus without complications: Secondary | ICD-10-CM

## 2014-04-22 LAB — HM DIABETES FOOT EXAM

## 2014-04-22 NOTE — Patient Instructions (Addendum)
Cut back on sweets (candy and cake), bread, pasta, potatos, rice. Increase exercise as able. Follow up DM new diagnosis in 3 months with labs prior. Set up eye appt for evaluation now with diabetes presents.    Diabetes and Standards of Medical Care Diabetes is complicated. You may find that your diabetes team includes a dietitian, nurse, diabetes educator, eye doctor, and more. To help everyone know what is going on and to help you get the care you deserve, the following schedule of care was developed to help keep you on track. Below are the tests, exams, vaccines, medicines, education, and plans you will need. HbA1c test This test shows how well you have controlled your glucose over the past 2-3 months. It is used to see if your diabetes management plan needs to be adjusted.   It is performed at least 2 times a year if you are meeting treatment goals.  It is performed 4 times a year if therapy has changed or if you are not meeting treatment goals. Blood pressure test  This test is performed at every routine medical visit. The goal is less than 140/90 mm Hg for most people, but 130/80 mm Hg in some cases. Ask your health care provider about your goal. Dental exam  Follow up with the dentist regularly. Eye exam  If you are diagnosed with type 1 diabetes as a child, get an exam upon reaching the age of 47 years or older and have had diabetes for 3-5 years. Yearly eye exams are recommended after that initial eye exam.  If you are diagnosed with type 1 diabetes as an adult, get an exam within 5 years of diagnosis and then yearly.  If you are diagnosed with type 2 diabetes, get an exam as soon as possible after the diagnosis and then yearly. Foot care exam  Visual foot exams are performed at every routine medical visit. The exams check for cuts, injuries, or other problems with the feet.  A comprehensive foot exam should be done yearly. This includes visual inspection as well as assessing  foot pulses and testing for loss of sensation.  Check your feet nightly for cuts, injuries, or other problems with your feet. Tell your health care provider if anything is not healing. Kidney function test (urine microalbumin)  This test is performed once a year.  Type 1 diabetes: The first test is performed 5 years after diagnosis.  Type 2 diabetes: The first test is performed at the time of diagnosis.  A serum creatinine and estimated glomerular filtration rate (eGFR) test is done once a year to assess the level of chronic kidney disease (CKD), if present. Lipid profile (cholesterol, HDL, LDL, triglycerides)  Performed every 5 years for most people.  The goal for LDL is less than 100 mg/dL. If you are at high risk, the goal is less than 70 mg/dL.  The goal for HDL is 40 mg/dL-50 mg/dL for men and 50 mg/dL-60 mg/dL for women. An HDL cholesterol of 60 mg/dL or higher gives some protection against heart disease.  The goal for triglycerides is less than 150 mg/dL. Influenza vaccine, pneumococcal vaccine, and hepatitis B vaccine  The influenza vaccine is recommended yearly.  It is recommended that people with diabetes who are over 28 years old get the pneumonia vaccine. In some cases, two separate shots may be given. Ask your health care provider if your pneumonia vaccination is up to date.  The hepatitis B vaccine is also recommended for adults with diabetes.  Diabetes self-management education  Education is recommended at diagnosis and ongoing as needed. Treatment plan  Your treatment plan is reviewed at every medical visit. Document Released: 04/28/2009 Document Revised: 11/15/2013 Document Reviewed: 12/01/2012 [facility] Patient Information 2015 [place], [place]. This information is not intended to replace advice given to you by your health care provider. Make sure you discuss any questions you have with your health care provider.

## 2014-04-22 NOTE — Assessment & Plan Note (Signed)
Not at goal now lower LDL < 100 with new diagnosis DM.  Encouraged exercise, weight loss, healthy eating habits. Recheck in 3 months.

## 2014-04-22 NOTE — Assessment & Plan Note (Signed)
Well controlled. Continue current medication.  

## 2014-04-22 NOTE — Progress Notes (Signed)
Pre visit review using our clinic review tool, if applicable. No additional management support is needed unless otherwise documented below in the visit note. 

## 2014-04-22 NOTE — Assessment & Plan Note (Signed)
Will fax recent PSA to urologist for further recs

## 2014-04-22 NOTE — Progress Notes (Signed)
   Subjective:    Patient ID: Chase Moreno, male    DOB: [birth year], 63 y.o.   MRN: [id number]  HPI 63 year old male presents for 6 month follow up.  He is doing well overall.  Hypertension: Well controlled on HCTZ  BP Readings from Last 3 Encounters:  04/22/14 110/70  01/24/14 136/74  12/13/13 122/74  Using medication without problems or lightheadedness: None  Chest pain with exertion:None  Edema:None  Short of breath:None  Average home BPs:Not checking  Other issues:  Wt Readings from Last 3 Encounters:  04/22/14 196 lb 8 oz (89.132 kg)  01/30/14 195 lb (88.451 kg)  01/24/14 199 lb (90.266 kg)   Treated for sleep apnea with pulmonology, Dr. Elsworth Soho. CPAP, has not been using lately.  High cholesterol, but improved from last check. LDL not at new  goal < 100.  Refuses medication at this time.  Lab Results  Component Value Date   CHOL 210* 04/18/2014   HDL 46.90 04/18/2014   LDLCALC 145* 04/18/2014   LDLDIRECT 169.8 02/24/2012   TRIG 89.0 04/18/2014   CHOLHDL 4 04/18/2014  Diet compliance: Moderate  Exercise:walk 1-3 times a week  Other complaints:   New diagnosis of DM. He has been eating candy every day. No family history of DM.  Due for eye exam.    Elevated PSA: Further increase in PSA, low percent free. Saw urologist in 12/2013 neg biopsy 2-3 months ago.  Review of Systems  Constitutional: Negative for fever and fatigue.  HENT: Negative for ear pain.   Eyes: Negative for pain.  Respiratory: Negative for shortness of breath.   Cardiovascular: Negative for chest pain.  Gastrointestinal: Negative for abdominal pain.       Objective:   Physical Exam  Constitutional: Vital signs are normal. He appears well-developed and well-nourished.  HENT:  Head: Normocephalic.  Right Ear: Hearing normal.  Left Ear: Hearing normal.  Nose: Nose normal.  Mouth/Throat: Oropharynx is clear and moist and mucous membranes are normal.  Neck: Trachea normal. Carotid bruit is not  present. No mass and no thyromegaly present.  Cardiovascular: Normal rate, regular rhythm and normal pulses.  Exam reveals no gallop, no distant heart sounds and no friction rub.   No murmur heard. No peripheral edema  Pulmonary/Chest: Effort normal and breath sounds normal. No respiratory distress.  Skin: Skin is warm, dry and intact. No rash noted.  Psychiatric: He has a normal mood and affect. His speech is normal and behavior is normal. Thought content normal.     Diabetic foot exam: Normal inspection No skin breakdown No calluses  Normal DP pulses Normal sensation to light touch and monofilament Nails normal      Assessment & Plan:

## 2014-07-24 ENCOUNTER — Telehealth: Payer: Self-pay | Admitting: Family Medicine

## 2014-07-24 DIAGNOSIS — E119 Type 2 diabetes mellitus without complications: Secondary | ICD-10-CM

## 2014-07-24 DIAGNOSIS — R972 Elevated prostate specific antigen [PSA]: Secondary | ICD-10-CM

## 2014-07-24 DIAGNOSIS — E7800 Pure hypercholesterolemia, unspecified: Secondary | ICD-10-CM

## 2014-07-24 NOTE — Telephone Encounter (Signed)
-----   Message from Ellamae Sia sent at 07/22/2014 11:27 AM EST ----- Regarding: Lab orders for Monday, 1.11.15 Lab orders for 3 month f/u

## 2014-07-25 ENCOUNTER — Other Ambulatory Visit (INDEPENDENT_AMBULATORY_CARE_PROVIDER_SITE_OTHER): Payer: BLUE CROSS/BLUE SHIELD

## 2014-07-25 DIAGNOSIS — E119 Type 2 diabetes mellitus without complications: Secondary | ICD-10-CM

## 2014-07-25 DIAGNOSIS — E7800 Pure hypercholesterolemia, unspecified: Secondary | ICD-10-CM

## 2014-07-25 LAB — LIPID PANEL
CHOL/HDL RATIO: 6
CHOLESTEROL: 218 mg/dL — AB (ref 0–200)
HDL: 39.3 mg/dL (ref >39.00)
LDL Cholesterol: 165 mg/dL — ABNORMAL HIGH (ref 0–99)
NonHDL: 178.7
Triglycerides: 68 mg/dL (ref 0.0–149.0)
VLDL: 13.6 mg/dL (ref 0.0–40.0)

## 2014-07-25 LAB — COMPREHENSIVE METABOLIC PANEL
ALBUMIN: 4 g/dL (ref 3.5–5.2)
ALT: 15 U/L (ref 0–53)
AST: 21 U/L (ref 0–37)
Alkaline Phosphatase: 53 U/L (ref 39–117)
BUN: 16 mg/dL (ref 6–23)
CALCIUM: 9 mg/dL (ref 8.4–10.5)
CO2: 27 meq/L (ref 19–32)
Chloride: 106 mEq/L (ref 96–112)
Creatinine, Ser: 1.1 mg/dL (ref 0.4–1.5)
GFR: 84.77 mL/min (ref >60.00)
Glucose, Bld: 96 mg/dL (ref 70–99)
Potassium: 4 mEq/L (ref 3.5–5.1)
Sodium: 140 mEq/L (ref 135–145)
TOTAL PROTEIN: 8 g/dL (ref 6.0–8.3)
Total Bilirubin: 0.3 mg/dL (ref 0.2–1.2)

## 2014-07-25 LAB — MICROALBUMIN / CREATININE URINE RATIO
CREATININE, U: 227.2 mg/dL
MICROALB UR: 2.7 mg/dL — AB (ref 0.0–1.9)
Microalb Creat Ratio: 1.2 mg/g (ref 0.0–30.0)

## 2014-07-25 LAB — HEMOGLOBIN A1C: Hgb A1c MFr Bld: 6.2 % (ref 4.6–6.5)

## 2014-07-27 ENCOUNTER — Other Ambulatory Visit: Payer: Self-pay | Admitting: Family Medicine

## 2014-07-29 ENCOUNTER — Ambulatory Visit (INDEPENDENT_AMBULATORY_CARE_PROVIDER_SITE_OTHER): Payer: BLUE CROSS/BLUE SHIELD | Admitting: Family Medicine

## 2014-07-29 ENCOUNTER — Encounter: Payer: Self-pay | Admitting: Family Medicine

## 2014-07-29 VITALS — BP 120/88 | HR 64 | Temp 98.3°F | Ht 67.0 in | Wt 197.5 lb

## 2014-07-29 DIAGNOSIS — E7800 Pure hypercholesterolemia, unspecified: Secondary | ICD-10-CM

## 2014-07-29 DIAGNOSIS — I10 Essential (primary) hypertension: Secondary | ICD-10-CM

## 2014-07-29 DIAGNOSIS — R809 Proteinuria, unspecified: Secondary | ICD-10-CM

## 2014-07-29 DIAGNOSIS — E119 Type 2 diabetes mellitus without complications: Secondary | ICD-10-CM

## 2014-07-29 LAB — HM DIABETES FOOT EXAM

## 2014-07-29 MED ORDER — LOSARTAN POTASSIUM-HCTZ 50-12.5 MG PO TABS: 1.0000 | ORAL_TABLET | Freq: Every day | ORAL | Status: DC

## 2014-07-29 NOTE — Patient Instructions (Addendum)
Decrease pork rinds and high cholesterol foods. Consider red yeast rice 600 mg cap 1-2 cap twice daily. Stop HCTZ and change to to losartan/ HCTZ low dose. Follow BP at home. Return in 7-10 days after chaning BP medicaiton for check of kidney blood test.

## 2014-07-29 NOTE — Progress Notes (Signed)
Pre visit review using our clinic review tool, if applicable. No additional management support is needed unless otherwise documented below in the visit note. 

## 2014-07-29 NOTE — Assessment & Plan Note (Signed)
[place] at decreasing chol in diet. Consider trial of red yeast rice. Recheck in 3-6 months.

## 2014-07-29 NOTE — Assessment & Plan Note (Signed)
Much improved with lifestyle changes.

## 2014-07-29 NOTE — Progress Notes (Signed)
64 year old male presents for 3 month follow up.  He is doing well overall.  Hypertension: Well controlled on HCTZ  BP Readings from Last 3 Encounters:  07/29/14 120/88  04/22/14 110/70  01/24/14 136/74  Using medication without problems or lightheadedness: None  Chest pain with exertion:None  Edema:None  Short of breath:None  Average home BPs:Not checking  Other issues:  Wt Readings from Last 3 Encounters:  07/29/14 197 lb 8 oz (89.585 kg)  04/22/14 196 lb 8 oz (89.132 kg)  01/30/14 195 lb (88.451 kg)   Treated for sleep apnea with pulmonology, Dr. Elsworth Soho. CPAP, has not been using lately.  High cholesterol, worsened from last check. LDL not at new goal < 100.  Refuses medication at this time.  Lab Results  Component Value Date   CHOL 218* 07/25/2014   HDL 39.30 07/25/2014   LDLCALC 165* 07/25/2014   LDLDIRECT 169.8 02/24/2012   TRIG 68.0 07/25/2014   CHOLHDL 6 07/25/2014  Diet compliance: Moderate , he has been working on low sugar Exercise:walk 1-3 times a week  Other complaints:   Diabetes, well controlled.  Improved FBS Lab Results  Component Value Date   HGBA1C 6.2 07/25/2014  He has cut back on candy. No family history of DM. Due for eye exam.  Elevated microalbumin: not on ACEI.   Review of Systems  Constitutional: Negative for fever and fatigue.  HENT: Negative for ear pain.  Eyes: Negative for pain.  Respiratory: Negative for shortness of breath.  Cardiovascular: Negative for chest pain.  Gastrointestinal: Negative for abdominal pain.       Objective:   Physical Exam  Constitutional: Vital signs are normal. He appears well-developed and well-nourished.  HENT:  Head: Normocephalic.  Right Ear: Hearing normal.  Left Ear: Hearing normal.  Nose: Nose normal.  Mouth/Throat: Oropharynx is clear and moist and mucous membranes are normal.  Neck: Trachea normal. Carotid bruit is not present. No mass and no thyromegaly present.   Cardiovascular: Normal rate, regular rhythm and normal pulses. Exam reveals no gallop, no distant heart sounds and no friction rub.  No murmur heard. No peripheral edema  Pulmonary/Chest: Effort normal and breath sounds normal. No respiratory distress.  Skin: Skin is warm, dry and intact. No rash noted.  Psychiatric: He has a normal mood and affect. His speech is normal and behavior is normal. Thought content normal.     Diabetic foot exam: Normal inspection No skin breakdown No calluses  Normal DP pulses Normal sensation to light touch and monofilament Nails normal

## 2014-07-29 NOTE — Assessment & Plan Note (Signed)
Change to ARB given elevated microalbumin. Check Cr in 7-10 days.

## 2014-08-01 ENCOUNTER — Telehealth: Payer: Self-pay

## 2014-08-01 NOTE — Telephone Encounter (Signed)
Pt was seen 07/29/14 and med was changed; HCTZ for 3 months cost to pt is $10.00. One month of losartan HCTZ was >$60.00. Pt cannot afford $60 per month and request less expensive substitute to [facility]. Pt request cb.

## 2014-08-01 NOTE — Telephone Encounter (Signed)
Is there an ACEI or and ARB on the [facility] list?

## 2014-08-02 MED ORDER — LISINOPRIL-HYDROCHLOROTHIAZIDE 10-12.5 MG PO TABS: 1.0000 | ORAL_TABLET | Freq: Every day | ORAL | Status: DC

## 2014-08-02 NOTE — Telephone Encounter (Signed)
Sent in lisinopril HCTZ... On [facility] 4 dollar list.

## 2014-08-02 NOTE — Telephone Encounter (Signed)
Left message for Chase Moreno that a different medication called Lisinopril-HCTZ has been sent to [facility] and it is on their $4.00 list.

## 2014-08-02 NOTE — Telephone Encounter (Signed)
Left message for Mr. Granderson to return my call.

## 2014-08-02 NOTE — Telephone Encounter (Signed)
[facility] Prescription list placed in your in box for review.

## 2014-10-27 ENCOUNTER — Ambulatory Visit (INDEPENDENT_AMBULATORY_CARE_PROVIDER_SITE_OTHER): Payer: BLUE CROSS/BLUE SHIELD | Admitting: Internal Medicine

## 2014-10-27 ENCOUNTER — Other Ambulatory Visit (INDEPENDENT_AMBULATORY_CARE_PROVIDER_SITE_OTHER): Payer: BLUE CROSS/BLUE SHIELD

## 2014-10-27 ENCOUNTER — Encounter: Payer: Self-pay | Admitting: Internal Medicine

## 2014-10-27 ENCOUNTER — Telehealth: Payer: Self-pay | Admitting: Family Medicine

## 2014-10-27 VITALS — BP 126/78 | HR 78 | Temp 98.6°F | Wt 191.0 lb

## 2014-10-27 DIAGNOSIS — I10 Essential (primary) hypertension: Secondary | ICD-10-CM

## 2014-10-27 DIAGNOSIS — E119 Type 2 diabetes mellitus without complications: Secondary | ICD-10-CM

## 2014-10-27 DIAGNOSIS — J309 Allergic rhinitis, unspecified: Secondary | ICD-10-CM

## 2014-10-27 DIAGNOSIS — E7800 Pure hypercholesterolemia, unspecified: Secondary | ICD-10-CM

## 2014-10-27 DIAGNOSIS — R05 Cough: Secondary | ICD-10-CM

## 2014-10-27 DIAGNOSIS — Z125 Encounter for screening for malignant neoplasm of prostate: Secondary | ICD-10-CM

## 2014-10-27 DIAGNOSIS — R059 Cough, unspecified: Secondary | ICD-10-CM

## 2014-10-27 LAB — BASIC METABOLIC PANEL
BUN: 10 mg/dL (ref 6–23)
CHLORIDE: 101 meq/L (ref 96–112)
CO2: 31 mEq/L (ref 19–32)
CREATININE: 1.2 mg/dL (ref 0.40–1.50)
Calcium: 9.2 mg/dL (ref 8.4–10.5)
GFR: 78.22 mL/min (ref >60.00)
Glucose, Bld: 109 mg/dL — ABNORMAL HIGH (ref 70–99)
Potassium: 4.3 mEq/L (ref 3.5–5.1)
SODIUM: 136 meq/L (ref 135–145)

## 2014-10-27 MED ORDER — HYDROCODONE-HOMATROPINE 5-1.5 MG/5ML PO SYRP
5.0000 mL | ORAL_SOLUTION | Freq: Three times a day (TID) | ORAL | Status: DC | PRN
Start: 2014-10-27 — End: 2015-09-12

## 2014-10-27 NOTE — Progress Notes (Signed)
Pre visit review using our clinic review tool, if applicable. No additional management support is needed unless otherwise documented below in the visit note. 

## 2014-10-27 NOTE — Telephone Encounter (Signed)
-----   Message from Ellamae Sia sent at 10/21/2014 12:02 PM EDT ----- Regarding: Lab orders for Friday, 4.15.16 Patient is scheduled for CPX labs, please order future labs, Thanks , Karna Christmas

## 2014-10-27 NOTE — Progress Notes (Signed)
HPI  Pt presents to the clinic today with c/o cough and body aches. Chase Moreno reports this started 4-5 days ago. The cough is nonproductive. Chase Moreno reports his joints hurt but Chase Moreno has not had any swelling. Chase Moreno has been sneezing but denies any other URI symptoms. Chase Moreno denies fever or chills. Chase Moreno did go to UC 2 days ago for the same. They gave him Tessalon and Chase Moreno has been taking it with mild relief. Chase Moreno does feel like Chase Moreno has improved but is concerned because Chase Moreno is not completely better. The joint pains seems to be the worse issue now. Chase Moreno does have seasonal allergies but does not take anything OTC for it. Chase Moreno has not had sick contacts that Chase Moreno is aware of. Chase Moreno has no history of arthritis in the past. Chase Moreno has had his flu shot.  Review of Systems      Past Medical History  Diagnosis Date  . Unspecified sleep apnea   . Unspecified essential hypertension   . Allergic rhinitis, cause unspecified     Family History  Problem Relation Age of Onset  . Arthritis Mother   . Arrhythmia Mother   . Stroke Paternal Grandmother     History   Social History  . Marital Status: Widowed    Spouse Name: N/A  . Number of Children: 2  . Years of Education: N/A   Occupational History  . [place] History Main Topics  . Smoking status: Never Smoker   . Smokeless tobacco: Never Used  . Alcohol Use: No  . Drug Use: No  . Sexual Activity: Not on file   Other Topics Concern  . Not on file   Social History Narrative   Regular exercise---yes, rabbit hunting, walking 2 times  A week.      Diet---eating fruit and veggies, limiting meat.      Widowed: Remarried.             No Known Allergies   Constitutional:  Denies headache, fatigue, fever or abrupt weight changes.  HEENT:  Denies eye redness, eye pain, pressure behind the eyes, facial pain, nasal congestion, ear pain, ringing in the ears, wax buildup, runny nose or sore throat. Respiratory: Positive cough. Denies  difficulty breathing or shortness of breath.  Cardiovascular: Denies chest pain, chest tightness, palpitations or swelling in the hands or feet.  MSK: Pt reports joint pain. Chase Moreno denies difficulty with ROM, muscle pain or difficulty with gait.   No other specific complaints in a complete review of systems (except as listed in HPI above).  Objective:   BP 126/78 mmHg  Pulse 78  Temp(Src) 98.6 F (37 C) (Oral)  Wt 191 lb (86.637 kg)  SpO2 97% Wt Readings from Last 3 Encounters:  10/27/14 191 lb (86.637 kg)  07/29/14 197 lb 8 oz (89.585 kg)  04/22/14 196 lb 8 oz (89.132 kg)     General: Appears his stated age, well developed, well nourished in NAD. HEENT: Head: normal shape and size, no sinus tenderness noted; Eyes: sclera white, no icterus, conjunctiva pink; Ears: cerumen impaction; Nose: mucosa boggy and moist, septum midline; Throat/Mouth: Teeth present, mucosa pink and moist, no exudate noted, no lesions or ulcerations noted.  Neck: Mild cervical lymphadenopathy. Neck supple, trachea midline. No massses, lumps or thyromegaly present.  Cardiovascular: Normal rate and rhythm. S1,S2 noted.  No murmur, rubs or gallops noted. No JVD or BLE edema. No carotid bruits noted. Pulmonary/Chest: Normal effort and positive  vesicular breath sounds. No respiratory distress. No wheezes, rales or ronchi noted.      Assessment & Plan:   Allergic Rhinitis:  Aleve or ibuprofen for body aches Start zyrtec at night x 1-2 weeks Rx for Hycodan cough syrup Work note to return Monday  RTC as needed or if symptoms persist.

## 2014-10-27 NOTE — Patient Instructions (Signed)

## 2014-10-28 ENCOUNTER — Other Ambulatory Visit: Payer: BLUE CROSS/BLUE SHIELD

## 2014-10-28 ENCOUNTER — Telehealth: Payer: Self-pay

## 2014-10-28 NOTE — Telephone Encounter (Signed)
pt left v/m; for work release letter pt said needs to include that pt can return to work on 10/31/14 with no restrictions. Pt request cb.

## 2014-10-31 NOTE — Telephone Encounter (Signed)
I think this has already been taken care of.

## 2014-10-31 NOTE — Telephone Encounter (Signed)
Yes but it doesn't say return without restrictions

## 2014-10-31 NOTE — Telephone Encounter (Signed)
Left message on voicemail.

## 2014-10-31 NOTE — Telephone Encounter (Signed)
Ok for work note? 

## 2014-11-01 NOTE — Telephone Encounter (Signed)
Left message on voicemail  Letter in front office for pt to pick up

## 2014-11-04 ENCOUNTER — Ambulatory Visit (INDEPENDENT_AMBULATORY_CARE_PROVIDER_SITE_OTHER): Payer: BLUE CROSS/BLUE SHIELD | Admitting: Family Medicine

## 2014-11-04 ENCOUNTER — Other Ambulatory Visit: Payer: Self-pay | Admitting: Family Medicine

## 2014-11-04 ENCOUNTER — Encounter: Payer: Self-pay | Admitting: Family Medicine

## 2014-11-04 VITALS — BP 118/76 | HR 79 | Temp 97.1°F | Ht 65.75 in | Wt 187.8 lb

## 2014-11-04 DIAGNOSIS — Z125 Encounter for screening for malignant neoplasm of prostate: Secondary | ICD-10-CM

## 2014-11-04 DIAGNOSIS — E119 Type 2 diabetes mellitus without complications: Secondary | ICD-10-CM | POA: Diagnosis not present

## 2014-11-04 DIAGNOSIS — E7800 Pure hypercholesterolemia, unspecified: Secondary | ICD-10-CM

## 2014-11-04 DIAGNOSIS — Z1159 Encounter for screening for other viral diseases: Secondary | ICD-10-CM

## 2014-11-04 DIAGNOSIS — Z0000 Encounter for general adult medical examination without abnormal findings: Secondary | ICD-10-CM

## 2014-11-04 LAB — COMPREHENSIVE METABOLIC PANEL
ALT: 17 U/L (ref 0–53)
AST: 22 U/L (ref 0–37)
Albumin: 4.3 g/dL (ref 3.5–5.2)
Alkaline Phosphatase: 51 U/L (ref 39–117)
BUN: 19 mg/dL (ref 6–23)
CO2: 32 mEq/L (ref 19–32)
CREATININE: 1.27 mg/dL (ref 0.40–1.50)
Calcium: 9.3 mg/dL (ref 8.4–10.5)
Chloride: 104 mEq/L (ref 96–112)
GFR: 73.26 mL/min (ref >60.00)
GLUCOSE: 87 mg/dL (ref 70–99)
Potassium: 4.3 mEq/L (ref 3.5–5.1)
Sodium: 138 mEq/L (ref 135–145)
Total Bilirubin: 0.4 mg/dL (ref 0.2–1.2)
Total Protein: 7.7 g/dL (ref 6.0–8.3)

## 2014-11-04 LAB — LIPID PANEL
CHOLESTEROL: 182 mg/dL (ref 0–200)
HDL: 40.5 mg/dL (ref >39.00)
LDL CALC: 127 mg/dL — AB (ref 0–99)
NonHDL: 141.5
Total CHOL/HDL Ratio: 4
Triglycerides: 75 mg/dL (ref 0.0–149.0)
VLDL: 15 mg/dL (ref 0.0–40.0)

## 2014-11-04 LAB — PSA: PSA: 4.36 ng/mL — AB (ref 0.10–4.00)

## 2014-11-04 LAB — HEMOGLOBIN A1C: Hgb A1c MFr Bld: 6.1 % (ref 4.6–6.5)

## 2014-11-04 LAB — HM DIABETES FOOT EXAM

## 2014-11-04 NOTE — Patient Instructions (Addendum)
Stop at lab on way out.  Keep working on exercise, weight loss and healthy eating, low carb.  Schedule yearly eye exam.  Call insurance to determine if they cover shingle (zostavax) vaccine. Start baby aspirin daily 81 mg .

## 2014-11-04 NOTE — Progress Notes (Signed)
The patient is here for annual wellness exam and preventative care.   He is doing well overall. Hypertension: Well controlled on HCTZ  BP Readings from Last 3 Encounters:  11/04/14 118/76  10/27/14 126/78  07/29/14 120/88  Using medication without problems or lightheadedness: None  Chest pain with exertion:None  Edema:None  Short of breath:None  Average home BPs:Not checking  Other issues:  Wt Readings from Last 3 Encounters:  11/04/14 187 lb 12 oz (85.163 kg)  10/27/14 191 lb (86.637 kg)  07/29/14 197 lb 8 oz (89.585 kg)            High cholesterol, Due for re-eval. Lab Results  Component Value Date   CHOL 182 11/04/2014   HDL 40.50 11/04/2014   LDLCALC 127* 11/04/2014   LDLDIRECT 169.8 02/24/2012   TRIG 75.0 11/04/2014   CHOLHDL 4 11/04/2014    Diet compliance: Moderate  Exercise:walk 1-2 times a week  Other complaints:   Diabetes:  Due for re-eval. Improving diet Using medications without difficulties: Hypoglycemic episodes: Hyperglycemic episodes: Feet problems: none Blood Sugars averaging: not checking eye exam within last year: due   Review of Systems  Constitutional: Negative for fever, fatigue and unexpected weight change.  HENT: Negative for ear pain, congestion, sore throat, rhinorrhea, trouble swallowing and postnasal drip.  Eyes: Negative for pain.  Respiratory: Negative for cough, shortness of breath and wheezing.  Cardiovascular: Negative for chest pain, palpitations and leg swelling.  Gastrointestinal: Negative for nausea, abdominal pain, diarrhea, constipation and blood in stool.  Genitourinary: Negative for dysuria, urgency, hematuria, discharge, penile swelling, scrotal swelling, difficulty urinating, penile pain and testicular pain.  Skin: Negative for rash.  Neurological: Negative for syncope, weakness, light-headedness, numbness and headaches.  Psychiatric/Behavioral: Negative for behavioral problems and dysphoric mood.  The patient is not nervous/anxious.  Objective:   Physical Exam  Constitutional: He appears well-developed and well-nourished. Non-toxic appearance. He does not appear ill. No distress.  HENT:  Head: Normocephalic and atraumatic.  Right Ear: Hearing, tympanic membrane, external ear and ear canal normal.  Left Ear: Hearing, tympanic membrane, external ear and ear canal normal.  Nose: Nose normal.  Mouth/Throat: Uvula is midline, oropharynx is clear and moist and mucous membranes are normal.  Eyes: Conjunctivae, EOM and lids are normal. Pupils are equal, round, and reactive to light. No foreign bodies found.  Neck: Trachea normal, normal range of motion and phonation normal. Neck supple. Carotid bruit is not present. No mass and no thyromegaly present.  Cardiovascular: Normal rate, regular rhythm, S1 normal, S2 normal, intact distal pulses and normal pulses. Exam reveals no gallop.  No murmur heard.  Pulmonary/Chest: Breath sounds normal. He has no wheezes. He has no rhonchi. He has no rales.  Abdominal: Soft. Normal appearance and bowel sounds are normal. There is no hepatosplenomegaly. There is no tenderness. There is no rebound, no guarding and no CVA tenderness. No hernia. Hernia confirmed negative in the right inguinal area and confirmed negative in the left inguinal area.  Genitourinary: Prostate normal, testes normal and penis normal. Rectal exam shows no external hemorrhoid, no internal hemorrhoid, no fissure, no mass, no tenderness and anal tone normal. Guaiac negative stool. Prostate is not enlarged and not tender. Right testis shows no mass and no tenderness. Left testis shows no mass and no tenderness. No paraphimosis or penile tenderness.  Lymphadenopathy:  He has no cervical adenopathy.  Right: No inguinal adenopathy present.  Left: No inguinal adenopathy present.  Neurological: He is alert. He has normal  strength and normal reflexes. No cranial nerve deficit or sensory  deficit. Gait normal.  Skin: Skin is warm, dry and intact. No rash noted.  Psychiatric: He has a normal mood and affect. His speech is normal and behavior is normal. Judgment normal.  Assessment & Plan:   The patient's preventative maintenance and recommended screening tests for an annual wellness exam were reviewed in full today.  Brought up to date unless services declined.  Counselled on the importance of diet, exercise, and its role in overall health and mortality.  The patient's FH and SH was reviewed, including their home life, tobacco status, and drug and alcohol status.   Vaccines: uptodate with Td, due for shingles vaccine  Colon: 10/09/2007 Dr. Fuller Plan, polyps, Repeat due in 10 years.  Hep C:  Done today. OMB:TDHRCBU.  nonsmoker: Prostate: Saw urologist last year x 3 . Had biopsy. Lab Results  Component Value Date   PSA 4.71* 04/18/2014   PSA 3.29 10/11/2013   PSA 2.76 02/24/2012

## 2014-11-04 NOTE — Progress Notes (Signed)
Pre visit review using our clinic review tool, if applicable. No additional management support is needed unless otherwise documented below in the visit note. 

## 2014-11-05 LAB — HEPATITIS C ANTIBODY: HCV AB: NEGATIVE

## 2014-11-07 ENCOUNTER — Telehealth: Payer: Self-pay

## 2014-11-07 NOTE — Telephone Encounter (Signed)
Pt checked with ins co and shingles vaccine is covered if received in doctors office; pt scheduled appt on 11/09/14 at 9 AM for immunization. (pt was seen on 11/04/14 for annual exam).

## 2014-11-08 ENCOUNTER — Telehealth: Payer: Self-pay | Admitting: Family Medicine

## 2014-11-08 MED ORDER — ATORVASTATIN CALCIUM 20 MG PO TABS: 20.0000 mg | ORAL_TABLET | Freq: Every day | ORAL | Status: DC

## 2014-11-08 NOTE — Telephone Encounter (Signed)
Patient returned Donna's call.  Please call him back at [phone].

## 2014-11-08 NOTE — Telephone Encounter (Signed)
Lab results discussed by telephonewith  Chase Moreno.  He is agreeable to start Atorvastatin.  Request Rx be sent to [facility].  He also wanted to make sure Dr. Randa Spike knew he is coming in tomorrow for his Shingle vaccine.

## 2014-11-08 NOTE — Telephone Encounter (Signed)
Noted  

## 2014-11-08 NOTE — Telephone Encounter (Signed)
-----   Message from Carter Kitten, [place] sent at 11/08/2014  9:37 AM EDT ----- Mr. Welchel notified as instructed by telephone.  He is agreeable to starting Atorvastatin.  Please send in Rx to [facility] in [place].

## 2014-11-09 ENCOUNTER — Ambulatory Visit (INDEPENDENT_AMBULATORY_CARE_PROVIDER_SITE_OTHER): Payer: BLUE CROSS/BLUE SHIELD | Admitting: *Deleted

## 2014-11-09 DIAGNOSIS — Z23 Encounter for immunization: Secondary | ICD-10-CM | POA: Diagnosis not present

## 2015-09-12 ENCOUNTER — Ambulatory Visit (INDEPENDENT_AMBULATORY_CARE_PROVIDER_SITE_OTHER): Payer: PPO | Admitting: Family Medicine

## 2015-09-12 ENCOUNTER — Encounter: Payer: Self-pay | Admitting: Family Medicine

## 2015-09-12 VITALS — BP 110/66 | HR 92 | Temp 97.3°F | Ht 65.75 in | Wt 201.2 lb

## 2015-09-12 DIAGNOSIS — R053 Chronic cough: Secondary | ICD-10-CM

## 2015-09-12 DIAGNOSIS — I10 Essential (primary) hypertension: Secondary | ICD-10-CM | POA: Diagnosis not present

## 2015-09-12 DIAGNOSIS — Z23 Encounter for immunization: Secondary | ICD-10-CM

## 2015-09-12 DIAGNOSIS — G4730 Sleep apnea, unspecified: Secondary | ICD-10-CM | POA: Diagnosis not present

## 2015-09-12 DIAGNOSIS — R05 Cough: Secondary | ICD-10-CM | POA: Diagnosis not present

## 2015-09-12 MED ORDER — LOSARTAN POTASSIUM-HCTZ 50-12.5 MG PO TABS: 1.0000 | ORAL_TABLET | Freq: Every day | ORAL | Status: DC

## 2015-09-12 NOTE — Assessment & Plan Note (Signed)
Well controlled when CPAP working well. Call pulmonary for new prescription given broken CPAP/BIpap to get prescription with specific settings as requested by machine dealer.

## 2015-09-12 NOTE — Progress Notes (Signed)
Pre visit review using our clinic review tool, if applicable. No additional management support is needed unless otherwise documented below in the visit note. 

## 2015-09-12 NOTE — Progress Notes (Signed)
   Subjective:    Patient ID: Chase Moreno, male    DOB: 1951-07-03, 65 y.o.   MRN: [id number]  HPI  65 year old male with HTN on lisinopril HCTZ presents with chronic cough x 1 year worsening in last few weeks.  He reports cough started with tickle in throat every few days, now occuring more frequently, no it is all day long. Dry cough  Mucus in posterior throat especially with lying down. Cough is keeping him up at night.  No nasal discharge, no wheeze, no SOB, no sneezing. No heartburn.  Tried cough drops, gargling. No help.  Has been on lisinopril for a while. Tried stopping for 2-3 days.  BP Readings from Last 3 Encounters:  09/12/15 110/66  11/04/14 118/76  10/27/14 126/78     Social History /Family History/Past Medical History reviewed and updated if needed. Nonsmoker Wears sleep apnea for CPAP.  CPAP is not working well, last sleep test 2 years ago, need a replacement per pulm. Last eval 2015.  Things going well he just needs a replacement    Review of Systems  Constitutional: Negative for fever and fatigue.  HENT: Negative for ear pain.   Eyes: Negative for pain.  Respiratory: Positive for cough. Negative for shortness of breath and wheezing.   Cardiovascular: Negative for chest pain.       Objective:   Physical Exam  Constitutional: Vital signs are normal. He appears well-developed and well-nourished.  Non-toxic appearance. He does not appear ill. No distress.  HENT:  Head: Normocephalic and atraumatic.  Right Ear: Hearing, tympanic membrane, external ear and ear canal normal. No tenderness. No foreign bodies. Tympanic membrane is not retracted and not bulging.  Left Ear: Hearing, tympanic membrane, external ear and ear canal normal. No tenderness. No foreign bodies. Tympanic membrane is not retracted and not bulging.  Nose: Nose normal. No mucosal edema or rhinorrhea. Right sinus exhibits no maxillary sinus tenderness and no frontal sinus tenderness. Left  sinus exhibits no maxillary sinus tenderness and no frontal sinus tenderness.  Mouth/Throat: Uvula is midline, oropharynx is clear and moist and mucous membranes are normal. Normal dentition. No dental caries. No oropharyngeal exudate or tonsillar abscesses.  Eyes: Conjunctivae, EOM and lids are normal. Pupils are equal, round, and reactive to light. Lids are everted and swept, no foreign bodies found.  Neck: Trachea normal, normal range of motion and phonation normal. Neck supple. Carotid bruit is not present. No thyroid mass and no thyromegaly present.  Cardiovascular: Normal rate, regular rhythm, S1 normal, S2 normal, normal heart sounds, intact distal pulses and normal pulses.  Exam reveals no gallop.   No murmur heard. Pulmonary/Chest: Effort normal and breath sounds normal. No respiratory distress. He has no wheezes. He has no rhonchi. He has no rales.  Abdominal: Soft. Normal appearance and bowel sounds are normal. There is no hepatosplenomegaly. There is no tenderness. There is no rebound, no guarding and no CVA tenderness. No hernia.  Neurological: He is alert. He has normal reflexes.  Skin: Skin is warm, dry and intact. No rash noted.  Psychiatric: He has a normal mood and affect. His speech is normal and behavior is normal. Judgment normal.          Assessment & Plan:

## 2015-09-12 NOTE — Patient Instructions (Addendum)
Call pulmonary to get new CPAP/BIPAP prescription. Change lisinopril HCTZ to losartan HCTZ that does not have cough as side effect. If no better in 1-2 week start flonase 2 spray per nostril daily. Call if not improving in next  4 weeks or new shortness of breath.

## 2015-09-12 NOTE — Assessment & Plan Note (Signed)
Follow at home on new med.

## 2015-09-12 NOTE — Assessment & Plan Note (Signed)
Nonsmoker, no red flags.  Likely secondary to ACEI, no sign of infection.  Possible allergy component. Change to ARB.  Can try flonase for allergies.

## 2015-09-12 NOTE — Addendum Note (Signed)
Addended by: Carter Kitten on: 09/12/2015 02:18 PM   Modules accepted: Orders

## 2015-09-13 ENCOUNTER — Telehealth: Payer: Self-pay | Admitting: Pulmonary Disease

## 2015-09-13 DIAGNOSIS — G4733 Obstructive sleep apnea (adult) (pediatric): Secondary | ICD-10-CM

## 2015-09-13 NOTE — Telephone Encounter (Signed)
Dr Diona Browner informed us that bipap not working  Last seen 01/2014 Bipap 21/16 required during titration study as final pressure. I believe he is currently on 17/13  Pl ask DME to investigate & provide down load on current settings

## 2015-09-14 NOTE — Telephone Encounter (Signed)
Called and left detailed message on patient's voicemail asking him to call back to let us know what DME company he uses for his BiPAP. Awaiting call back from patient.

## 2015-09-19 NOTE — Telephone Encounter (Signed)
Left message for patient to call back  

## 2015-09-26 NOTE — Telephone Encounter (Signed)
Patient states he uses [facility]. Order entered for BiPAP investigation and download. Patient aware. Nothing further needed.

## 2015-09-29 ENCOUNTER — Telehealth: Payer: Self-pay | Admitting: Pulmonary Disease

## 2015-09-29 NOTE — Telephone Encounter (Signed)
Pt has not been seen since 01/24/14 by the office I called made Melissa aware pt will need OV to be seen in office. She will inform pt. Nothing further needed

## 2015-10-03 ENCOUNTER — Telehealth: Payer: Self-pay | Admitting: Pulmonary Disease

## 2015-10-03 ENCOUNTER — Ambulatory Visit (INDEPENDENT_AMBULATORY_CARE_PROVIDER_SITE_OTHER)
Admission: RE | Admit: 2015-10-03 | Discharge: 2015-10-03 | Disposition: A | Discharge status: Home / Self Care | Payer: PPO | Source: Ambulatory Visit | Attending: Family Medicine | Admitting: Family Medicine

## 2015-10-03 ENCOUNTER — Encounter: Payer: Self-pay | Admitting: Family Medicine

## 2015-10-03 ENCOUNTER — Ambulatory Visit (INDEPENDENT_AMBULATORY_CARE_PROVIDER_SITE_OTHER): Payer: PPO | Admitting: Family Medicine

## 2015-10-03 VITALS — BP 130/90 | HR 89 | Temp 97.8°F | Ht 65.75 in | Wt 202.5 lb

## 2015-10-03 DIAGNOSIS — G4730 Sleep apnea, unspecified: Secondary | ICD-10-CM | POA: Diagnosis not present

## 2015-10-03 DIAGNOSIS — R053 Chronic cough: Secondary | ICD-10-CM

## 2015-10-03 DIAGNOSIS — G4733 Obstructive sleep apnea (adult) (pediatric): Secondary | ICD-10-CM

## 2015-10-03 DIAGNOSIS — R202 Paresthesia of skin: Secondary | ICD-10-CM

## 2015-10-03 DIAGNOSIS — I10 Essential (primary) hypertension: Secondary | ICD-10-CM | POA: Diagnosis not present

## 2015-10-03 DIAGNOSIS — R05 Cough: Secondary | ICD-10-CM

## 2015-10-03 DIAGNOSIS — R200 Anesthesia of skin: Secondary | ICD-10-CM | POA: Diagnosis not present

## 2015-10-03 DIAGNOSIS — Z9989 Dependence on other enabling machines and devices: Principal | ICD-10-CM

## 2015-10-03 NOTE — Progress Notes (Signed)
Pre visit review using our clinic review tool, if applicable. No additional management support is needed unless otherwise documented below in the visit note. 

## 2015-10-03 NOTE — Assessment & Plan Note (Signed)
BP has been good at home on new medication ARB.

## 2015-10-03 NOTE — Patient Instructions (Addendum)
Start nasal steroid spray 2 sprays per nostril daily.  Start zyrtec at bedtime.  Try for 2 weeks, then if not improving try over the counter prilosec 20 mg daily at bedtime for 2 weeks.. Call if not improving after that. Call Dr. Elsworth Soho to set up appt  to get a new CPAP/Bipap.  We will call with CXR. Make sure not putting pressure on elbow and wrist repetitively.

## 2015-10-03 NOTE — Assessment & Plan Note (Signed)
Intermittant.. No clear red flags for CVA, MI etc.  Most consistent with nerve compression.

## 2015-10-03 NOTE — Progress Notes (Signed)
   Subjective:    Patient ID: Chase Moreno, male    DOB: 02-28-1951, 65 y.o.   MRN: [id number]  HPI  65 year old male presents with several issues.   1. Not improved from last OV 2/28 dx with  Chronic cough x 1 year.  At last OV changed from ACEI to ARB Started on flonase. He reports cough started with tickle in throat every few days, now occuring more frequently, no it is all day long. Dry cough Mucus in posterior throat especially with lying down. Cough is keeping him up at night.  No nasal discharge, no wheeze, no SOB, no sneezing. No heartburn.  no difficulty swallowing.  Tried cough drops, gargling. No help.   Nonsmoker, no red flags.   No improvement with change in BP med. He tried mucinex but has not tried nasal steroid.  2. Tingling in left arm, slight, x few days. Off and on. Pins and needles. No neck pain, no weakness.   3. Sleep apnea, on cpap. Needs new CPAP machine. Last sleep study: told by pulm , Dr. Elsworth Soho,  he needs appt to be seen.  BP Readings from Last 3 Encounters:  10/03/15 130/90  09/12/15 110/66  11/04/14 118/76     Review of Systems  Constitutional: Negative for fever and fatigue.  HENT: Negative for ear pain.   Eyes: Negative for pain.  Respiratory: Negative for cough and shortness of breath.   Cardiovascular: Negative for chest pain.  Gastrointestinal: Negative for abdominal pain.       Objective:   Physical Exam  Constitutional: Vital signs are normal. He appears well-developed and well-nourished.  HENT:  Head: Normocephalic.  Right Ear: Hearing normal.  Left Ear: Hearing normal.  Nose: Nose normal.  Mouth/Throat: Oropharynx is clear and moist and mucous membranes are normal.  Neck: Trachea normal. Carotid bruit is not present. No thyroid mass and no thyromegaly present.  Cardiovascular: Normal rate, regular rhythm and normal pulses.  Exam reveals no gallop, no distant heart sounds and no friction rub.   No murmur heard. No  peripheral edema  Pulmonary/Chest: Effort normal and breath sounds normal. No respiratory distress.  Skin: Skin is warm, dry and intact. No rash noted.  Psychiatric: He has a normal mood and affect. His speech is normal and behavior is normal. Thought content normal.          Assessment & Plan:

## 2015-10-03 NOTE — Assessment & Plan Note (Signed)
Pt instructed he needs an appointment with Dr. Elsworth Soho for rx for CPAP replacment.

## 2015-10-03 NOTE — Telephone Encounter (Signed)
lmtcb X1 for pt  

## 2015-10-03 NOTE — Assessment & Plan Note (Addendum)
Low risk, nml exam but will eval with CXR given > 1 year. No change with change to ARB.  Has not tried allergy or GERD treatment.. Trial of zyrtec and flonase. Then prilosec for occult reflux.  If still not improving consider referral to ENT and changing of ARB.

## 2015-10-04 NOTE — Telephone Encounter (Signed)
Patient returned call, CB cell [phone], home [phone]

## 2015-10-04 NOTE — Telephone Encounter (Signed)
Not  seen since 01/2014  Pl ask [facility] to check - then he needs OV

## 2015-10-04 NOTE — Telephone Encounter (Signed)
Pt states that his DME is needing a RX faxed over to them for his CPAP machine.  Pt states that he needs a new machine but they will not give him one without a order/rx. CPAP machine is not working properly. At times machine will not turn on.  [facility] (P) [phone] (F) [phone]  Please advise Dr Elsworth Soho.

## 2015-10-04 NOTE — Telephone Encounter (Signed)
LMTCB

## 2015-10-04 NOTE — Telephone Encounter (Signed)
Patient already has follow up appointment scheduled for 10/21/15. Sent order for [facility] to check patient's machine. Nothing further needed.

## 2015-10-06 ENCOUNTER — Telehealth: Payer: Self-pay | Admitting: *Deleted

## 2015-10-06 NOTE — Telephone Encounter (Signed)
Lm on pts vm and advised per Dr Diona Browner. Pt advised to contact pulmonology

## 2015-10-06 NOTE — Telephone Encounter (Signed)
As pt has been told several times, I do not write for this. Per pulmonary Dr. Bari Mantis notes in epic.Marland Kitchen He needs to make an appt with them for them to write a prescription.

## 2015-10-06 NOTE — Telephone Encounter (Signed)
Pt states that his CPAP machine has broken. He is needing a new Rx to pickup and take to [place], to receive another one.

## 2015-10-09 ENCOUNTER — Telehealth: Payer: Self-pay | Admitting: Pulmonary Disease

## 2015-10-09 DIAGNOSIS — G4733 Obstructive sleep apnea (adult) (pediatric): Secondary | ICD-10-CM

## 2015-10-09 NOTE — Telephone Encounter (Signed)
Pt needs a replacement CPAP machine. Pt states his machine got too hot and has not worked since. Pt aware that we will send an order to [facility] to check on this or replace machine. Order placed for CPAP machine. Pt aware that someone will be calling him regarding the order. Nothing further needed.

## 2015-10-18 ENCOUNTER — Ambulatory Visit (INDEPENDENT_AMBULATORY_CARE_PROVIDER_SITE_OTHER): Payer: PPO | Admitting: Acute Care

## 2015-10-18 ENCOUNTER — Encounter: Payer: Self-pay | Admitting: Acute Care

## 2015-10-18 VITALS — BP 124/88 | HR 97 | Temp 97.9°F | Ht 67.0 in | Wt 202.6 lb

## 2015-10-18 DIAGNOSIS — R079 Chest pain, unspecified: Secondary | ICD-10-CM | POA: Diagnosis not present

## 2015-10-18 DIAGNOSIS — R202 Paresthesia of skin: Secondary | ICD-10-CM

## 2015-10-18 DIAGNOSIS — G4733 Obstructive sleep apnea (adult) (pediatric): Secondary | ICD-10-CM | POA: Diagnosis not present

## 2015-10-18 DIAGNOSIS — R200 Anesthesia of skin: Secondary | ICD-10-CM

## 2015-10-18 NOTE — Assessment & Plan Note (Signed)
Pt. Needs replacement BiPAP machine as treatment for OHS His is 65 years old and is getting hot at night.  Plan: We will place another order for a new  BiPap  Machine and send the visit notes to [place]. Continue on CPAP at bedtime. You appear to be benefiting from the treatment Goal is to wear for at least 4-6 hours each night for maximal clinical benefit. Continue to work on weight loss, as the link between excess weight  and sleep apnea is well established.  Do not drive if sleepy. Follow up with Dr. Elsworth Soho  1 month after you get the new machine for a download and or before if you need [place] for anything else.  We will place a referral for a neurology consult for the intermittent left arm tingling. We will do an EKG today. Please contact office for sooner follow up if symptoms do not improve or worsen or seek emergency care

## 2015-10-18 NOTE — Patient Instructions (Addendum)
We will place another order for a new  BiPap  Machine and send the visit notes to [place]. Continue on CPAP at bedtime. You appear to be benefiting from the treatment Goal is to wear for at least 4-6 hours each night for maximal clinical benefit. Continue to work on weight loss, as the link between excess weight  and sleep apnea is well established.  Do not drive if sleepy. Follow up with Dr. Elsworth Soho  1 month after you get the new machine for a download and or before if you need [place] for anything else.  We will place a referral for a neurology consult for the intermittent left arm tingling. We will do an EKG today. Please contact office for sooner follow up if symptoms do not improve or worsen or seek emergency care

## 2015-10-18 NOTE — Progress Notes (Signed)
Subjective:    Patient ID: Chase Moreno, male    DOB: Jul 25, 1950, 65 y.o.   MRN: [id number]  HPI   65 year old with moderately severe obstructive sleep apnea. Last seen in the office in 01/2014.  Tests:  PSG7/5/09 >> severe REM related OSA with AHI of 23 per hour and REM AHI of 81 per hour , corrected by BiPAP 24/19, large mirage quattro mask. PLMs ++ partially corrected with BiPAP.  BiPAP initiated at 17/13   Last sleep titration study was done in 02/2014. Last setting during the study was  21/16.  10/18/15: Follow Up Visit: Presents to the office for need for new BiPap machine. Current machine is over 45 years old and is getting hot at night.He feels it is not safe to use. We have placed 3 orders with [facility] , but I believe they need an office visit prior to providing a new machine.States he has  daytime sleepiness, and  snoring at night to the point it wakes him upso he does not advance to a  deep sleep cycle. Also states he has been having some tingling per left arm without any chest pain or diaphoresis. He feels it is more of a nerve/ musculoskeletal problem. He states it has been going on for about 2 monthsWe will check an EKG. He denies fever, SOB,chest pain, hemoptysis, orthopnea,no recent automobile or airline trips. No change in activity level.   Current outpatient prescriptions:  .  losartan-hydrochlorothiazide (HYZAAR) 50-12.5 MG tablet, Take 1 tablet by mouth daily., Disp: 30 tablet, Rfl: 11 .  mometasone (NASONEX) 50 MCG/ACT nasal spray, Place 2 sprays into the nose daily., Disp: , Rfl:    Past Medical History  Diagnosis Date  . Unspecified sleep apnea   . Unspecified essential hypertension   . Allergic rhinitis, cause unspecified     No Known Allergies  Review of Systems Constitutional:   No  weight loss, night sweats,  Fevers, chills, fatigue, or  lassitude.  HEENT:   No headaches,  Difficulty swallowing,  Tooth/dental problems, or  Sore throat,            No sneezing, itching, ear ache, nasal congestion, post nasal drip,   CV:  No chest pain,  Orthopnea, PND, swelling in lower extremities, anasarca, dizziness, palpitations, syncope.   GI  No heartburn, indigestion, abdominal pain, nausea, vomiting, diarrhea, change in bowel habits, loss of appetite, bloody stools.   Resp: No shortness of breath with exertion or at rest.  No excess mucus, no productive cough,  No non-productive cough,  No coughing up of blood.  No change in color of mucus.  No wheezing.  No chest wall deformity  Skin: no rash or lesions.  GU: no dysuria, change in color of urine, no urgency or frequency.  No flank pain, no hematuria   MS:  No joint pain or swelling.  No decreased range of motion.  No back pain. + intermittent left arm tingling  Psych:  No change in mood or affect. No depression or anxiety.  No memory loss.        Objective:   Physical Exam BP 124/88 mmHg  Pulse 97  Temp(Src) 97.9 F (36.6 C) (Oral)  Ht 5\' 7"  (1.702 m)  Wt 202 lb 9.6 oz (91.899 kg)  BMI 31.72 kg/m2  SpO2 96%   Physical Exam:  General- No distress,  A&Ox3,very pleasant gentleman ENT: No sinus tenderness, TM clear, pale nasal mucosa, no oral exudate,no post nasal drip, no  LAN Cardiac: S1, S2, regular rate and rhythm, no murmur Chest: No wheeze/ rales/ dullness; no accessory muscle use, no nasal flaring, no sternal retractions Abd.: Soft Non-tender Ext: No clubbing cyanosis, edema Neuro:  normal strength Skin: No rashes, warm and dry Psych: normal mood and behavior  Magdalen Spatz, AGACNP-BC [place] Pager # [phone] October 18, 2015      Assessment & Plan:

## 2015-10-18 NOTE — Progress Notes (Signed)
Reviewed & agree with plan  

## 2015-10-19 ENCOUNTER — Ambulatory Visit: Payer: PPO | Admitting: Pulmonary Disease

## 2015-10-26 DIAGNOSIS — G4733 Obstructive sleep apnea (adult) (pediatric): Secondary | ICD-10-CM | POA: Diagnosis not present

## 2015-11-01 ENCOUNTER — Telehealth: Payer: Self-pay | Admitting: Pulmonary Disease

## 2015-11-01 NOTE — Telephone Encounter (Signed)
Spoke with pt and he states that he got his new BiPap machine last week. Pt states that he needs to schedule f/u with [facility] after starting new machine. Pt states that his machine is running out of water and he has been in touch with [facility] regarding settings. Appt made for f/u and pt advised to continue to work with [facility] regarding humidity. Nothing further needed.

## 2015-11-13 ENCOUNTER — Ambulatory Visit: Payer: PPO | Admitting: Neurology

## 2015-11-21 ENCOUNTER — Telehealth: Payer: Self-pay | Admitting: Family Medicine

## 2015-11-21 ENCOUNTER — Other Ambulatory Visit (INDEPENDENT_AMBULATORY_CARE_PROVIDER_SITE_OTHER): Payer: PPO

## 2015-11-21 DIAGNOSIS — E119 Type 2 diabetes mellitus without complications: Secondary | ICD-10-CM

## 2015-11-21 DIAGNOSIS — R972 Elevated prostate specific antigen [PSA]: Secondary | ICD-10-CM

## 2015-11-21 DIAGNOSIS — Z1159 Encounter for screening for other viral diseases: Secondary | ICD-10-CM

## 2015-11-21 DIAGNOSIS — R809 Proteinuria, unspecified: Secondary | ICD-10-CM

## 2015-11-21 DIAGNOSIS — E7800 Pure hypercholesterolemia, unspecified: Secondary | ICD-10-CM

## 2015-11-21 LAB — LIPID PANEL
CHOL/HDL RATIO: 4
Cholesterol: 191 mg/dL (ref 0–200)
HDL: 44.6 mg/dL (ref >39.00)
LDL CALC: 132 mg/dL — AB (ref 0–99)
NonHDL: 146.04
TRIGLYCERIDES: 68 mg/dL (ref 0.0–149.0)
VLDL: 13.6 mg/dL (ref 0.0–40.0)

## 2015-11-21 LAB — COMPREHENSIVE METABOLIC PANEL
ALT: 10 U/L (ref 0–53)
AST: 16 U/L (ref 0–37)
Albumin: 4.3 g/dL (ref 3.5–5.2)
Alkaline Phosphatase: 51 U/L (ref 39–117)
BUN: 13 mg/dL (ref 6–23)
CALCIUM: 9.3 mg/dL (ref 8.4–10.5)
CHLORIDE: 107 meq/L (ref 96–112)
CO2: 29 meq/L (ref 19–32)
CREATININE: 1.15 mg/dL (ref 0.40–1.50)
GFR: 81.88 mL/min (ref >60.00)
Glucose, Bld: 106 mg/dL — ABNORMAL HIGH (ref 70–99)
Potassium: 4.4 mEq/L (ref 3.5–5.1)
Sodium: 142 mEq/L (ref 135–145)
Total Bilirubin: 0.3 mg/dL (ref 0.2–1.2)
Total Protein: 7.3 g/dL (ref 6.0–8.3)

## 2015-11-21 LAB — PSA, MEDICARE: PSA: 5.34 ng/ml — ABNORMAL HIGH (ref 0.10–4.00)

## 2015-11-21 LAB — HEMOGLOBIN A1C: Hgb A1c MFr Bld: 6.2 % (ref 4.6–6.5)

## 2015-11-21 NOTE — Telephone Encounter (Signed)
-----   Message from Marchia Bond sent at 11/14/2015  8:36 AM EDT ----- Regarding: Cpx labs Tues 5/9, need orders. Thanks! :-) Please order  future cpx labs for pt's upcoming lab appt. Thanks Aniceto Boss

## 2015-11-25 DIAGNOSIS — G4733 Obstructive sleep apnea (adult) (pediatric): Secondary | ICD-10-CM | POA: Diagnosis not present

## 2015-11-28 ENCOUNTER — Ambulatory Visit (INDEPENDENT_AMBULATORY_CARE_PROVIDER_SITE_OTHER): Payer: PPO | Admitting: Family Medicine

## 2015-11-28 ENCOUNTER — Encounter: Payer: Self-pay | Admitting: Family Medicine

## 2015-11-28 VITALS — BP 116/68 | HR 76 | Temp 98.2°F | Ht 66.25 in | Wt 200.5 lb

## 2015-11-28 DIAGNOSIS — R972 Elevated prostate specific antigen [PSA]: Secondary | ICD-10-CM | POA: Diagnosis not present

## 2015-11-28 DIAGNOSIS — E7800 Pure hypercholesterolemia, unspecified: Secondary | ICD-10-CM

## 2015-11-28 DIAGNOSIS — E119 Type 2 diabetes mellitus without complications: Secondary | ICD-10-CM

## 2015-11-28 DIAGNOSIS — I10 Essential (primary) hypertension: Secondary | ICD-10-CM

## 2015-11-28 DIAGNOSIS — Z0000 Encounter for general adult medical examination without abnormal findings: Secondary | ICD-10-CM

## 2015-11-28 DIAGNOSIS — G4733 Obstructive sleep apnea (adult) (pediatric): Secondary | ICD-10-CM

## 2015-11-28 LAB — HM DIABETES FOOT EXAM

## 2015-11-28 NOTE — Progress Notes (Signed)
I have personally reviewed the Medicare Annual Wellness questionnaire and have noted 1. The patient's medical and social history 2. Their use of alcohol, tobacco or illicit drugs 3. Their current medications and supplements 4. The patient's functional ability including ADL's, fall risks, home safety risks and hearing or visual             impairment. 5. Diet and physical activities 6. Evidence for depression or mood disorders 7.         Updated provider list Cognitive evaluation was performed and recorded on pt medicare questionnaire form. The patients weight, height, BMI and visual acuity have been recorded in the chart  I have made referrals, counseling and provided education to the patient based review of the above and I have provided the pt with a written personalized care plan for preventive services.   He is doing well overall.   Sleep apnea, have the new BiPAP machine. Working well.  Hypertension: Well controlled on HCTZ  BP Readings from Last 3 Encounters:  11/28/15 116/68  10/18/15 124/88  10/03/15 130/90  Using medication without problems or lightheadedness: None  Chest pain with exertion:None  Edema:None  Short of breath:None  Average home BPs:Not checking  Other issues:  Wt Readings from Last 3 Encounters:  11/28/15 200 lb 8 oz (90.946 kg)  10/18/15 202 lb 9.6 oz (91.899 kg)  10/03/15 202 lb 8 oz (91.853 kg)   Body mass index is 32.11 kg/(m^2).  High cholesterol, LDL not at goal < 100 on no medication. Lab Results  Component Value Date   CHOL 191 11/21/2015   HDL 44.60 11/21/2015   LDLCALC 132* 11/21/2015   LDLDIRECT 169.8 02/24/2012   TRIG 68.0 11/21/2015   CHOLHDL 4 11/21/2015  Diet compliance: Moderate  Exercise:walk 1-2 times a week  Other complaints:   Diabetes:  Well controlled on no medication. Lab Results  Component Value Date   HGBA1C 6.2 11/21/2015   Improving diet Using medications without difficulties: Hypoglycemic  episodes: Hyperglycemic episodes: Feet problems: none Blood Sugars averaging: not checking eye exam within last year: due  Social History /Family History/Past Medical History reviewed and updated if needed.   Review of Systems  Constitutional: Negative for fever, fatigue and unexpected weight change.  HENT: Negative for ear pain, congestion, sore throat, rhinorrhea, trouble swallowing and postnasal drip.  Eyes: Negative for pain.  Respiratory: Negative for cough, shortness of breath and wheezing.  Cardiovascular: Negative for chest pain, palpitations and leg swelling.  Gastrointestinal: Negative for nausea, abdominal pain, diarrhea, constipation and blood in stool.  Genitourinary: Negative for dysuria, urgency, hematuria, discharge, penile swelling, scrotal swelling, difficulty urinating, penile pain and testicular pain.  Skin: Negative for rash.  Neurological: Negative for syncope, weakness, light-headedness, numbness and headaches.  Psychiatric/Behavioral: Negative for behavioral problems and dysphoric mood. The patient is not nervous/anxious.  Objective:   Physical Exam  Constitutional: He appears well-developed and well-nourished. Non-toxic appearance. He does not appear ill. No distress.  HENT:  Head: Normocephalic and atraumatic.  Right Ear: Hearing, tympanic membrane, external ear and ear canal normal.  Left Ear: Hearing, tympanic membrane, external ear and ear canal normal.  Nose: Nose normal.  Mouth/Throat: Uvula is midline, oropharynx is clear and moist and mucous membranes are normal.  Eyes: Conjunctivae, EOM and lids are normal. Pupils are equal, round, and reactive to light. No foreign bodies found.  Neck: Trachea normal, normal range of motion and phonation normal. Neck supple. Carotid bruit is not present. No mass and no  thyromegaly present.  Cardiovascular: Normal rate, regular rhythm, S1 normal, S2 normal, intact distal pulses and normal pulses.  Exam reveals no gallop.  No murmur heard.  Pulmonary/Chest: Breath sounds normal. He has no wheezes. He has no rhonchi. He has no rales.  Abdominal: Soft. Normal appearance and bowel sounds are normal. There is no hepatosplenomegaly. There is no tenderness. There is no rebound, no guarding and no CVA tenderness. No hernia. Hernia confirmed negative in the right inguinal area and confirmed negative in the left inguinal area.  Genitourinary: Prostate is not enlarged and not tender. Right testis shows no mass and no tenderness. Left testis shows no mass and no tenderness. No paraphimosis or penile tenderness.  Lymphadenopathy:  He has no cervical adenopathy.  Right: No inguinal adenopathy present.  Left: No inguinal adenopathy present.  Neurological: He is alert. He has normal strength and normal reflexes. No cranial nerve deficit or sensory deficit. Gait normal.  Skin: Skin is warm, dry and intact. No rash noted.  Psychiatric: He has a normal mood and affect. His speech is normal and behavior is normal. Judgment normal.    Diabetic foot exam: Normal inspection No skin breakdown No calluses  Normal DP pulses Normal sensation to light touch and monofilament Nails normal  Assessment & Plan:   The patient's preventative maintenance and recommended screening tests for an annual wellness exam were reviewed in full today.  Brought up to date unless services declined.  Counselled on the importance of diet, exercise, and its role in overall health and mortality.  The patient's FH and SH was reviewed, including their home life, tobacco status, and drug and alcohol status.   Vaccines: uptodate with Td, shingles vaccine  Colon: 10/09/2007 Dr. Fuller Plan, polyps, Repeat due in 10 years.  Hep C:neg nonsmoker Prostate: Saw urologist 2016. Had biopsy. Lab Results  Component Value Date   PSA 5.34* 11/21/2015   PSA 4.36* 11/04/2014   PSA 4.71* 04/18/2014

## 2015-11-28 NOTE — Assessment & Plan Note (Signed)
Well controlled. Continue current medication.  

## 2015-11-28 NOTE — Assessment & Plan Note (Signed)
Well controlled with diet. On no med.

## 2015-11-28 NOTE — Assessment & Plan Note (Signed)
No doing well back on BiPAP.

## 2015-11-28 NOTE — Progress Notes (Signed)
Pre visit review using our clinic review tool, if applicable. No additional management support is needed unless otherwise documented below in the visit note. 

## 2015-11-28 NOTE — Patient Instructions (Addendum)
Work on low cholesterol diet.. Decrease ice cream. Red yeast rice 600 mg cap.. Most helpful dose 1200 mg twice daily.  Work on weight loss and increase exercise.  Return for labs for prostate in 3 months.  Set up a yearly diabetes eye exam.

## 2015-11-28 NOTE — Assessment & Plan Note (Signed)
LDL not at goal on no medication. Pt refuses any kind of prescription med for treatment. Reviewed diet and encouraged pt to try red yeast rice to lower cholesterol. Encouraged exercise, weight loss, healthy eating habits.

## 2015-11-28 NOTE — Assessment & Plan Note (Signed)
Increase in PSA in last year. Follow over time. If continues to increase will refer back to urologist for further re-eval.

## 2015-12-26 ENCOUNTER — Ambulatory Visit: Payer: PPO | Admitting: Pulmonary Disease

## 2015-12-26 DIAGNOSIS — G4733 Obstructive sleep apnea (adult) (pediatric): Secondary | ICD-10-CM | POA: Diagnosis not present

## 2015-12-29 ENCOUNTER — Ambulatory Visit (INDEPENDENT_AMBULATORY_CARE_PROVIDER_SITE_OTHER): Payer: PPO | Admitting: Adult Health

## 2015-12-29 ENCOUNTER — Encounter: Payer: Self-pay | Admitting: Adult Health

## 2015-12-29 VITALS — BP 112/82 | HR 68 | Ht 67.0 in | Wt 205.0 lb

## 2015-12-29 DIAGNOSIS — G4733 Obstructive sleep apnea (adult) (pediatric): Secondary | ICD-10-CM | POA: Diagnosis not present

## 2015-12-29 NOTE — Progress Notes (Signed)
   Subjective:    Patient ID: Chase Moreno, male    DOB: Jul 16, 1950, 65 y.o.   MRN: [id number]  HPI 65 yo male with OSA on BIPAP  12/29/2015 Follow up: OSA  Pt returns for a follow up for OSA .  Says he doing very well. Feels that is helping him sleep.  No significant daytime sleepiness.  Download shows excellent compliance with avg usage at 9h  AHI 2.0. +leaks. It appears that mask is not firting properly esp around upper nose . It looks like it might be too big.  Denies chest pain , orthopnea edema ir fever      Review of Systems Constitutional:   No  weight loss, night sweats,  Fevers, chills, fatigue, or  lassitude.  HEENT:   No headaches,  Difficulty swallowing,  Tooth/dental problems, or  Sore throat,                No sneezing, itching, ear ache, nasal congestion, post nasal drip,   CV:  No chest pain,  Orthopnea, PND, swelling in lower extremities, anasarca, dizziness, palpitations, syncope.   GI  No heartburn, indigestion, abdominal pain, nausea, vomiting, diarrhea, change in bowel habits, loss of appetite, bloody stools.   Resp: No shortness of breath with exertion or at rest.  No excess mucus, no productive cough,  No non-productive cough,  No coughing up of blood.  No change in color of mucus.  No wheezing.  No chest wall deformity  Skin: no rash or lesions.  GU: no dysuria, change in color of urine, no urgency or frequency.  No flank pain, no hematuria   MS:  No joint pain or swelling.  No decreased range of motion.  No back pain.  Psych:  No change in mood or affect. No depression or anxiety.  No memory loss.         Objective:   Physical Exam Filed Vitals:   12/29/15 1616  BP: 112/82  Pulse: 68  Height: 5\' 7"  (1.702 m)  Weight: 205 lb (92.987 kg)  SpO2: 96%    GEN: A/Ox3; pleasant , NAD, well nourished   HEENT:  [place]/AT,  EACs-clear, TMs-wnl, NOSE-clear, THROAT-clear, no lesions, no postnasal drip or exudate noted. Class MP 3 airway  NECK:  Supple  w/ fair ROM; no JVD; normal carotid impulses w/o bruits; no thyromegaly or nodules palpated; no lymphadenopathy.  RESP  Clear  P & A; w/o, wheezes/ rales/ or rhonchi.no accessory muscle use, no dullness to percussion  CARD:  RRR, no m/r/g  , no peripheral edema, pulses intact, no cyanosis or clubbing.  GI:   Soft & nt; nml bowel sounds; no organomegaly or masses detected.  Musco: Warm bil, no deformities or joint swelling noted.   Neuro: alert, no focal deficits noted.    Skin: Warm, no lesions or rashes  Carnel Stegman NP-C  [facility]  12/29/2015       Assessment & Plan:

## 2015-12-29 NOTE — Patient Instructions (Signed)
Order for mask fitting .  Wear  BIPAP each night  Do not drive if sleepy.  Work on weight loss.  Follow up Dr. Elsworth Soho  In 6  months and As needed

## 2015-12-29 NOTE — Assessment & Plan Note (Signed)
Well controlled on BIPAP with excellent compliance  Needs mask fitting   Plan  Cont on BIPAP  Send for mask fitting

## 2016-01-25 DIAGNOSIS — G4733 Obstructive sleep apnea (adult) (pediatric): Secondary | ICD-10-CM | POA: Diagnosis not present

## 2016-01-31 DIAGNOSIS — G4733 Obstructive sleep apnea (adult) (pediatric): Secondary | ICD-10-CM | POA: Diagnosis not present

## 2016-02-25 DIAGNOSIS — G4733 Obstructive sleep apnea (adult) (pediatric): Secondary | ICD-10-CM | POA: Diagnosis not present

## 2016-03-27 DIAGNOSIS — G4733 Obstructive sleep apnea (adult) (pediatric): Secondary | ICD-10-CM | POA: Diagnosis not present

## 2016-04-26 DIAGNOSIS — G4733 Obstructive sleep apnea (adult) (pediatric): Secondary | ICD-10-CM | POA: Diagnosis not present

## 2016-04-29 DIAGNOSIS — G4733 Obstructive sleep apnea (adult) (pediatric): Secondary | ICD-10-CM | POA: Diagnosis not present

## 2016-05-21 ENCOUNTER — Telehealth: Payer: Self-pay | Admitting: Family Medicine

## 2016-05-21 DIAGNOSIS — R972 Elevated prostate specific antigen [PSA]: Secondary | ICD-10-CM

## 2016-05-21 DIAGNOSIS — R809 Proteinuria, unspecified: Secondary | ICD-10-CM

## 2016-05-21 DIAGNOSIS — E119 Type 2 diabetes mellitus without complications: Secondary | ICD-10-CM

## 2016-05-21 DIAGNOSIS — E7800 Pure hypercholesterolemia, unspecified: Secondary | ICD-10-CM

## 2016-05-21 NOTE — Telephone Encounter (Signed)
-----   Message from Marchia Bond sent at 05/17/2016  3:22 PM EDT ----- Regarding: Dm f/u labs Mon 11/13 need orders. Thanks! :-) Please order future dm f/u labs for pt's upcoming lab appt. Thanks Aniceto Boss

## 2016-05-22 NOTE — Addendum Note (Signed)
Addended by: Ellamae Sia on: 05/22/2016 10:53 AM   Modules accepted: Orders

## 2016-05-27 DIAGNOSIS — G4733 Obstructive sleep apnea (adult) (pediatric): Secondary | ICD-10-CM | POA: Diagnosis not present

## 2016-05-28 ENCOUNTER — Other Ambulatory Visit: Payer: PPO

## 2016-05-30 ENCOUNTER — Ambulatory Visit: Payer: PPO | Admitting: Family Medicine

## 2016-05-30 ENCOUNTER — Telehealth: Payer: Self-pay | Admitting: Family Medicine

## 2016-05-30 DIAGNOSIS — Z0289 Encounter for other administrative examinations: Secondary | ICD-10-CM

## 2016-05-30 NOTE — Telephone Encounter (Signed)
Patient did not come in for their appointment today for 6 mo follow up  Please let me know if patient needs to be contacted immediately for follow up or no follow up needed. °

## 2016-05-30 NOTE — Telephone Encounter (Signed)
Needs follow-up

## 2016-06-04 NOTE — Telephone Encounter (Signed)
L/m to reschedule appt mf [id number]

## 2016-06-21 ENCOUNTER — Ambulatory Visit (INDEPENDENT_AMBULATORY_CARE_PROVIDER_SITE_OTHER): Payer: PPO | Admitting: Family Medicine

## 2016-06-21 ENCOUNTER — Other Ambulatory Visit: Payer: Self-pay | Admitting: Family Medicine

## 2016-06-21 ENCOUNTER — Encounter: Payer: Self-pay | Admitting: Family Medicine

## 2016-06-21 VITALS — BP 106/74 | HR 71 | Temp 98.2°F | Ht 66.25 in | Wt 197.0 lb

## 2016-06-21 DIAGNOSIS — I10 Essential (primary) hypertension: Secondary | ICD-10-CM | POA: Diagnosis not present

## 2016-06-21 DIAGNOSIS — R972 Elevated prostate specific antigen [PSA]: Secondary | ICD-10-CM | POA: Diagnosis not present

## 2016-06-21 DIAGNOSIS — E119 Type 2 diabetes mellitus without complications: Secondary | ICD-10-CM

## 2016-06-21 DIAGNOSIS — E7800 Pure hypercholesterolemia, unspecified: Secondary | ICD-10-CM | POA: Diagnosis not present

## 2016-06-21 LAB — COMPREHENSIVE METABOLIC PANEL
ALBUMIN: 4.3 g/dL (ref 3.5–5.2)
ALK PHOS: 57 U/L (ref 39–117)
ALT: 14 U/L (ref 0–53)
AST: 18 U/L (ref 0–37)
BUN: 15 mg/dL (ref 6–23)
CALCIUM: 9.2 mg/dL (ref 8.4–10.5)
CHLORIDE: 106 meq/L (ref 96–112)
CO2: 31 mEq/L (ref 19–32)
CREATININE: 1.29 mg/dL (ref 0.40–1.50)
GFR: 71.59 mL/min (ref >60.00)
Glucose, Bld: 100 mg/dL — ABNORMAL HIGH (ref 70–99)
POTASSIUM: 4.1 meq/L (ref 3.5–5.1)
Sodium: 141 mEq/L (ref 135–145)
Total Bilirubin: 0.3 mg/dL (ref 0.2–1.2)
Total Protein: 8 g/dL (ref 6.0–8.3)

## 2016-06-21 LAB — LIPID PANEL
CHOLESTEROL: 191 mg/dL (ref 0–200)
HDL: 44.8 mg/dL (ref >39.00)
LDL CALC: 134 mg/dL — AB (ref 0–99)
NonHDL: 146.67
TRIGLYCERIDES: 64 mg/dL (ref 0.0–149.0)
Total CHOL/HDL Ratio: 4
VLDL: 12.8 mg/dL (ref 0.0–40.0)

## 2016-06-21 LAB — HEMOGLOBIN A1C: Hgb A1c MFr Bld: 5.5 % (ref 4.6–6.5)

## 2016-06-21 NOTE — Assessment & Plan Note (Signed)
Due for re-eval. 

## 2016-06-21 NOTE — Addendum Note (Signed)
Addended by: Ellamae Sia on: 06/21/2016 08:53 AM   Modules accepted: Orders

## 2016-06-21 NOTE — Patient Instructions (Addendum)
Please stop at the front desk or lab to set up referral or to have labs drawn.  Set up eye appt.. Yearly. Keep working on healthy eating and regular exercise. Weight loss as well.

## 2016-06-21 NOTE — Assessment & Plan Note (Signed)
Well controlled. Continue current medication. Encouraged exercise, weight loss, healthy eating habits.  

## 2016-06-21 NOTE — Progress Notes (Signed)
Pre visit review using our clinic review tool, if applicable. No additional management support is needed unless otherwise documented below in the visit note. 

## 2016-06-21 NOTE — Progress Notes (Signed)
   Subjective:    Patient ID: Chase Moreno, male    DOB: 03/18/1951, 65 y.o.   MRN: [id number]  HPI 65 year old male presents for 6 month follow up DM, HTN and chol.  Ears feel hearing, ear full of wax.   Hypertension:   Well controlled on losartan HCTZ. BP Readings from Last 3 Encounters:  06/21/16 106/74  12/29/15 112/82  11/28/15 116/68   Using medication without problems or lightheadedness:  None Chest pain with exertion:None Edema:None Short of breath:none Average home BPs: not checking Other issues:  Diabetes: Due for re-eval on no med.  Lab Results  Component Value Date   HGBA1C 6.2 11/21/2015  Feet problems:n one Blood Sugars averaging: not checking eye exam within last year: no   Elevated Cholesterol:  Pt high risk for CAD with history of DM.Marland Kitchen Due for re-eval. Using medications without problems: Muscle aches:  Diet compliance: good diet, low carb, low chol Exercise:walking 2-3 times a week,  rabbit hunting Other complaints:  Body mass index is 31.56 kg/m.  Review of Systems  Constitutional: Negative for fatigue.  HENT: Negative for ear pain.   Respiratory: Negative for shortness of breath.   Cardiovascular: Negative for chest pain, palpitations and leg swelling.  Gastrointestinal: Negative for abdominal distention.       Objective:   Physical Exam  Constitutional: Vital signs are normal. He appears well-developed and well-nourished.  HENT:  Head: Normocephalic.  Right Ear: Hearing normal.  Left Ear: Hearing normal.  Nose: Nose normal.  Mouth/Throat: Oropharynx is clear and moist and mucous membranes are normal.  Neck: Trachea normal. Carotid bruit is not present. No thyroid mass and no thyromegaly present.  Cardiovascular: Normal rate, regular rhythm and normal pulses.  Exam reveals no gallop, no distant heart sounds and no friction rub.   No murmur heard. No peripheral edema  Pulmonary/Chest: Effort normal and breath sounds normal. No  respiratory distress.  Skin: Skin is warm, dry and intact. No rash noted.  Psychiatric: He has a normal mood and affect. His speech is normal and behavior is normal. Thought content normal.    Diabetic foot exam: Normal inspection No skin breakdown No calluses  Normal DP pulses Normal sensation to light touch and monofilament Nails normal       Assessment & Plan:

## 2016-06-26 DIAGNOSIS — G4733 Obstructive sleep apnea (adult) (pediatric): Secondary | ICD-10-CM | POA: Diagnosis not present

## 2016-06-27 ENCOUNTER — Encounter: Payer: Self-pay | Admitting: *Deleted

## 2016-06-27 LAB — PSA, TOTAL AND FREE
PSA, % Free: 8 % — ABNORMAL LOW (ref >25)
PSA, Free: 0.6 ng/mL
PSA, Total: 7.6 ng/mL — ABNORMAL HIGH (ref <=4.0)

## 2016-07-27 DIAGNOSIS — G4733 Obstructive sleep apnea (adult) (pediatric): Secondary | ICD-10-CM | POA: Diagnosis not present

## 2016-08-08 DIAGNOSIS — G4733 Obstructive sleep apnea (adult) (pediatric): Secondary | ICD-10-CM | POA: Diagnosis not present

## 2016-08-27 DIAGNOSIS — G4733 Obstructive sleep apnea (adult) (pediatric): Secondary | ICD-10-CM | POA: Diagnosis not present

## 2016-09-16 DIAGNOSIS — R972 Elevated prostate specific antigen [PSA]: Secondary | ICD-10-CM | POA: Diagnosis not present

## 2016-09-20 ENCOUNTER — Other Ambulatory Visit: Payer: Self-pay | Admitting: Urology

## 2016-09-20 DIAGNOSIS — R972 Elevated prostate specific antigen [PSA]: Secondary | ICD-10-CM

## 2016-09-24 DIAGNOSIS — G4733 Obstructive sleep apnea (adult) (pediatric): Secondary | ICD-10-CM | POA: Diagnosis not present

## 2016-10-16 ENCOUNTER — Ambulatory Visit (HOSPITAL_COMMUNITY)
Admission: RE | Admit: 2016-10-16 | Discharge: 2016-10-16 | Disposition: A | Discharge status: Home / Self Care | Payer: PPO | Source: Ambulatory Visit | Attending: Urology | Admitting: Urology

## 2016-10-21 ENCOUNTER — Ambulatory Visit (HOSPITAL_COMMUNITY)
Admission: RE | Admit: 2016-10-21 | Discharge: 2016-10-21 | Disposition: A | Discharge status: Home / Self Care | Payer: PPO | Source: Ambulatory Visit | Attending: Urology | Admitting: Urology

## 2016-10-21 DIAGNOSIS — R972 Elevated prostate specific antigen [PSA]: Secondary | ICD-10-CM

## 2016-10-21 DIAGNOSIS — N4289 Other specified disorders of prostate: Secondary | ICD-10-CM | POA: Diagnosis not present

## 2016-10-21 LAB — POCT I-STAT CREATININE: Creatinine, Ser: 1.1 mg/dL (ref 0.61–1.24)

## 2016-10-21 MED ORDER — GADOBENATE DIMEGLUMINE 529 MG/ML IV SOLN
20.0000 mL | Freq: Once | INTRAVENOUS | Status: AC | PRN
  Administered 2016-10-21: 18 mL via INTRAVENOUS

## 2016-10-21 MED ORDER — LIDOCAINE HCL 2 % EX GEL
CUTANEOUS | Status: AC
  Filled 2016-10-21: qty 30

## 2016-10-25 DIAGNOSIS — G4733 Obstructive sleep apnea (adult) (pediatric): Secondary | ICD-10-CM | POA: Diagnosis not present

## 2016-12-02 DIAGNOSIS — R972 Elevated prostate specific antigen [PSA]: Secondary | ICD-10-CM | POA: Diagnosis not present

## 2016-12-25 DIAGNOSIS — C61 Malignant neoplasm of prostate: Secondary | ICD-10-CM | POA: Diagnosis not present

## 2017-01-07 ENCOUNTER — Other Ambulatory Visit: Payer: Self-pay | Admitting: Family Medicine

## 2017-01-10 ENCOUNTER — Other Ambulatory Visit: Payer: Self-pay | Admitting: Family Medicine

## 2017-01-21 DIAGNOSIS — C61 Malignant neoplasm of prostate: Secondary | ICD-10-CM | POA: Diagnosis not present

## 2017-01-22 DIAGNOSIS — M6281 Muscle weakness (generalized): Secondary | ICD-10-CM | POA: Diagnosis not present

## 2017-01-22 DIAGNOSIS — R278 Other lack of coordination: Secondary | ICD-10-CM | POA: Diagnosis not present

## 2017-01-22 DIAGNOSIS — C61 Malignant neoplasm of prostate: Secondary | ICD-10-CM | POA: Diagnosis not present

## 2017-01-23 ENCOUNTER — Other Ambulatory Visit: Payer: Self-pay | Admitting: Urology

## 2017-02-03 DIAGNOSIS — M6281 Muscle weakness (generalized): Secondary | ICD-10-CM | POA: Diagnosis not present

## 2017-02-03 DIAGNOSIS — N393 Stress incontinence (female) (male): Secondary | ICD-10-CM | POA: Diagnosis not present

## 2017-02-03 DIAGNOSIS — M62838 Other muscle spasm: Secondary | ICD-10-CM | POA: Diagnosis not present

## 2017-02-11 NOTE — Patient Instructions (Addendum)
Chase Moreno  02/11/2017   Your procedure is scheduled on: 02-17-17  Report to [facility] Main  Entrance Take Chase Moreno  elevators to 3rd floor to  Chase Moreno at [phone].   Call this number if you have problems the morning of surgery [phone]    Remember: ONLY 1 PERSON MAY GO WITH YOU TO SHORT STAY TO GET  READY MORNING OF Chase Moreno.  Do not eat food or drink liquids :After Midnight.      Take these medicines the morning of surgery with A SIP OF WATER: NONE                                 You may not have any metal on your body including hair pins and              piercings  Do not wear jewelry, make-up, lotions, powders or perfumes, deodorant                 Men may shave face and neck.   Do not bring valuables to the hospital. Chase Moreno.  Contacts, dentures or bridgework may not be worn into surgery.  Leave suitcase in the car. After surgery it may be brought to your room.               Please read over the following fact sheets you were given: _____________________________________________________________________           Chase Moreno - Preparing for Surgery Before surgery, you can play an important role.  Because skin is not sterile, your skin needs to be as free of germs as possible.  You can reduce the number of germs on your skin by washing with CHG (chlorahexidine gluconate) soap before surgery.  CHG is an antiseptic cleaner which kills germs and bonds with the skin to continue killing germs even after washing. Please DO NOT use if you have an allergy to CHG or antibacterial soaps.  If your skin becomes reddened/irritated stop using the CHG and inform your nurse when you arrive at Short Stay. Do not shave (including legs and underarms) for at least 48 hours prior to the first CHG shower.  You may shave your face/neck. Please follow these instructions carefully:  1.  Shower with CHG Soap the night  before surgery and the  morning of Surgery.  2.  If you choose to wash your hair, wash your hair first as usual with your  normal  shampoo.  3.  After you shampoo, rinse your hair and body thoroughly to remove the  shampoo.                           4.  Use CHG as you would any other liquid soap.  You can apply chg directly  to the skin and wash                       Gently with a scrungie or clean washcloth.  5.  Apply the CHG Soap to your body ONLY FROM THE NECK DOWN.   Do not use on face/ open  Wound or open sores. Avoid contact with eyes, ears mouth and genitals (private parts).                       Wash face,  Genitals (private parts) with your normal soap.             6.  Wash thoroughly, paying special attention to the area where your surgery  will be performed.  7.  Thoroughly rinse your body with warm water from the neck down.  8.  DO NOT shower/wash with your normal soap after using and rinsing off  the CHG Soap.                9.  Pat yourself dry with a clean towel.            10.  Wear clean pajamas.            11.  Place clean sheets on your bed the night of your first shower and do not  sleep with pets. Day of Surgery : Do not apply any lotions/deodorants the morning of surgery.  Please wear clean clothes to the hospital/surgery center.  FAILURE TO FOLLOW THESE INSTRUCTIONS MAY RESULT IN THE CANCELLATION OF YOUR SURGERY PATIENT SIGNATURE_________________________________  NURSE SIGNATURE__________________________________  ________________________________________________________________________   Chase Moreno  An incentive spirometer is a tool that can help keep your lungs clear and active. This tool measures how well you are filling your lungs with each breath. Taking long deep breaths may help reverse or decrease the chance of developing breathing (pulmonary) problems (especially infection) following:  A long period of time when you are  unable to move or be active. BEFORE THE PROCEDURE   If the spirometer includes an indicator to show your best effort, your nurse or respiratory therapist will set it to a desired goal.  If possible, sit up straight or lean slightly forward. Try not to slouch.  Hold the incentive spirometer in an upright position. INSTRUCTIONS FOR USE  1. Sit on the edge of your bed if possible, or sit up as far as you can in bed or on a chair. 2. Hold the incentive spirometer in an upright position. 3. Breathe out normally. 4. Place the mouthpiece in your mouth and seal your lips tightly around it. 5. Breathe in slowly and as deeply as possible, raising the piston or the ball toward the top of the column. 6. Hold your breath for 3-5 seconds or for as long as possible. Allow the piston or ball to fall to the bottom of the column. 7. Remove the mouthpiece from your mouth and breathe out normally. 8. Rest for a few seconds and repeat Steps 1 through 7 at least 10 times every 1-2 hours when you are awake. Take your time and take a few normal breaths between deep breaths. 9. The spirometer may include an indicator to show your best effort. Use the indicator as a goal to work toward during each repetition. 10. After each set of 10 deep breaths, practice coughing to be sure your lungs are clear. If you have an incision (the cut made at the time of surgery), support your incision when coughing by placing a pillow or rolled up towels firmly against it. Once you are able to get out of bed, walk around indoors and cough well. You may stop using the incentive spirometer when instructed by your caregiver.  RISKS AND COMPLICATIONS  Take your time so you do not get  dizzy or light-headed.  If you are in pain, you may need to take or ask for pain medication before doing incentive spirometry. It is harder to take a deep breath if you are having pain. AFTER USE  Rest and breathe slowly and easily.  It can be helpful to  keep track of a log of your progress. Your caregiver can provide you with a simple table to help with this. If you are using the spirometer at home, follow these instructions: Chase Moreno IF:   You are having difficultly using the spirometer.  You have trouble using the spirometer as often as instructed.  Your pain medication is not giving enough relief while using the spirometer.  You develop fever of 100.5 F (38.1 C) or higher. SEEK IMMEDIATE MEDICAL CARE IF:   You cough up bloody sputum that had not been present before.  You develop fever of 102 F (38.9 C) or greater.  You develop worsening pain at or near the incision site. MAKE SURE YOU:   Understand these instructions.  Will watch your condition.  Will get help right away if you are not doing well or get worse. Document Released: 11/11/2006 Document Revised: 09/23/2011 Document Reviewed: 01/12/2007 Exit[employer] Patient Information 2014 ExitCare, [place].   ________________________________________________________________________  WHAT IS A BLOOD TRANSFUSION? Blood Transfusion Information  A transfusion is the replacement of blood or some of its parts. Blood is made up of multiple cells which provide different functions.  Red blood cells carry oxygen and are used for blood loss replacement.  White blood cells fight against infection.  Platelets control bleeding.  Plasma helps clot blood.  Other blood products are available for specialized needs, such as hemophilia or other clotting disorders. BEFORE THE TRANSFUSION  Who gives blood for transfusions?   Healthy volunteers who are fully evaluated to make sure their blood is safe. This is blood bank blood. Transfusion therapy is the safest it has ever been in the practice of medicine. Before blood is taken from a donor, a complete history is taken to make sure that person has no history of diseases nor engages in risky social behavior (examples are intravenous drug  use or sexual activity with multiple partners). The donor's travel history is screened to minimize risk of transmitting infections, such as malaria. The donated blood is tested for signs of infectious diseases, such as HIV and hepatitis. The blood is then tested to be sure it is compatible with you in order to minimize the chance of a transfusion reaction. If you or a relative donates blood, this is often done in anticipation of surgery and is not appropriate for emergency situations. It takes many days to process the donated blood. RISKS AND COMPLICATIONS Although transfusion therapy is very safe and saves many lives, the main dangers of transfusion include:   Getting an infectious disease.  Developing a transfusion reaction. This is an allergic reaction to something in the blood you were given. Every precaution is taken to prevent this. The decision to have a blood transfusion has been considered carefully by your caregiver before blood is given. Blood is not given unless the benefits outweigh the risks. AFTER THE TRANSFUSION  Right after receiving a blood transfusion, you will usually feel much better and more energetic. This is especially true if your red blood cells have gotten low (anemic). The transfusion raises the level of the red blood cells which carry oxygen, and this usually causes an energy increase.  The nurse administering the transfusion will  monitor you carefully for complications. HOME CARE INSTRUCTIONS  No special instructions are needed after a transfusion. You may find your energy is better. Speak with your caregiver about any limitations on activity for underlying diseases you may have. SEEK MEDICAL CARE IF:   Your condition is not improving after your transfusion.  You develop redness or irritation at the intravenous (IV) site. SEEK IMMEDIATE MEDICAL CARE IF:  Any of the following symptoms occur over the next 12 hours:  Shaking chills.  You have a temperature by mouth  above 102 F (38.9 C), not controlled by medicine.  Chest, back, or muscle pain.  People around you feel you are not acting correctly or are confused.  Shortness of breath or difficulty breathing.  Dizziness and fainting.  You get a rash or develop hives.  You have a decrease in urine output.  Your urine turns a dark color or changes to pink, red, or brown. Any of the following symptoms occur over the next 10 days:  You have a temperature by mouth above 102 F (38.9 C), not controlled by medicine.  Shortness of breath.  Weakness after normal activity.  The white part of the eye turns yellow (jaundice).  You have a decrease in the amount of urine or are urinating less often.  Your urine turns a dark color or changes to pink, red, or brown. Document Released: 06/28/2000 Document Revised: 09/23/2011 Document Reviewed: 02/15/2008 [facility] Patient Information 2014 [place], [place].  _______________________________________________________________________

## 2017-02-12 ENCOUNTER — Encounter (HOSPITAL_COMMUNITY): Payer: Self-pay

## 2017-02-12 ENCOUNTER — Encounter (HOSPITAL_COMMUNITY)
Admission: RE | Admit: 2017-02-12 | Discharge: 2017-02-12 | Disposition: A | Discharge status: Home / Self Care | Payer: PPO | Source: Ambulatory Visit | Attending: Urology | Admitting: Urology

## 2017-02-12 DIAGNOSIS — Z01818 Encounter for other preprocedural examination: Secondary | ICD-10-CM | POA: Diagnosis not present

## 2017-02-12 DIAGNOSIS — Z01810 Encounter for preprocedural cardiovascular examination: Secondary | ICD-10-CM | POA: Insufficient documentation

## 2017-02-12 DIAGNOSIS — C61 Malignant neoplasm of prostate: Secondary | ICD-10-CM | POA: Insufficient documentation

## 2017-02-12 DIAGNOSIS — I10 Essential (primary) hypertension: Secondary | ICD-10-CM | POA: Insufficient documentation

## 2017-02-12 HISTORY — DX: Malignant (primary) neoplasm, unspecified: C80.1

## 2017-02-12 HISTORY — DX: Type 2 diabetes mellitus without complications: E11.9

## 2017-02-12 LAB — BASIC METABOLIC PANEL
ANION GAP: 6 (ref 5–15)
BUN: 16 mg/dL (ref 6–20)
CO2: 25 mmol/L (ref 22–32)
CREATININE: 1.29 mg/dL — AB (ref 0.61–1.24)
Calcium: 8.8 mg/dL — ABNORMAL LOW (ref 8.9–10.3)
Chloride: 109 mmol/L (ref 101–111)
GFR, EST NON AFRICAN AMERICAN: 56 mL/min — AB (ref >60)
GLUCOSE: 89 mg/dL (ref 65–99)
Potassium: 4.2 mmol/L (ref 3.5–5.1)
Sodium: 140 mmol/L (ref 135–145)

## 2017-02-12 LAB — CBC
HCT: 42 % (ref 39.0–52.0)
HEMOGLOBIN: 14.4 g/dL (ref 13.0–17.0)
MCH: 29.1 pg (ref 26.0–34.0)
MCHC: 34.3 g/dL (ref 30.0–36.0)
MCV: 84.8 fL (ref 78.0–100.0)
PLATELETS: 251 10*3/uL (ref 150–400)
RBC: 4.95 MIL/uL (ref 4.22–5.81)
RDW: 13.1 % (ref 11.5–15.5)
WBC: 6.4 10*3/uL (ref 4.0–10.5)

## 2017-02-12 LAB — ABO/RH: ABO/RH(D): O POS

## 2017-02-14 NOTE — H&P (Signed)
Office Visit Report     01/21/2017   --------------------------------------------------------------------------------   Chase Moreno  MRN: [id number]  PRIMARY CARE:    DOB: 08/17/1950, 66 year old Male  REFERRING:  Jinny Sanders, MD  SSN: [id number]  PROVIDER:  Festus Aloe, M.D.    TREATING:  Raynelle Bring, M.D.    LOCATION:  [facility] [phone]   --------------------------------------------------------------------------------   CC/HPI: CC: Prostate Cancer   Physician requesting consult: Dr. Eda Keys  PCP: Dr. Eliezer Lofts   Chase Moreno is a 66 year old gentleman who was noted to have an elevated PSA of 8.52. A prior biopsy in 2015 had indicated atypia but no definite malignancy. An MRI of the prostate on 10/21/16 indicated a 15 x 12 mm PI-RADS 3 lesion in the right transition zone of the prostate. He underwent a TRUS biopsy of the prostate on 12/02/16 that confirmed Gleason 4+3=7 adenocarcinoma with 5 out of 15 biopsy cores positive including 2 out of 3 of the targeted biopsies.   Family history: None.   Imaging studies: MRI (10/21/16) - No EPE, SVI, or LAD.   PMH: He has a history of OSA, hypertension, and hyperlipidemia.  PSH: No abdominal surgeries.   TNM stage: cT1c N0 Mx  PSA: 8.52  Gleason score: 4+3=7  Biopsy (12/02/16): 5/15 cores positive  Left: L lateral mid (5%, 3+3=6)  Right: R mid (10%, 4+3=7), R lateral mid (5%, 3+3=6)  MRI targets: 2/3 cores, (4+3=7, 3+4=7)  Prostate volume: 27.8 cc   Nomogram  OC disease: 38%  EPE: 60%  SVI: 6%  LNI: 7%  PFS (5 year, 10 year): 67%,51%   Urinary function: IPSS is 5.  Erectile function: SHIM score is 13. He has not taken medication for treatment. He finds it quite difficult to get an erection adequate enough for intercourse.     ALLERGIES: No Allergies    MEDICATIONS: Aspir 81  Hydrochlorothiazide 25 mg tablet Oral     GU PSH: Prostate Needle Biopsy - 12/02/2016      PSH Notes:  Tonsillectomy   NON-GU PSH: Remove Tonsils - 2015 Surgical Pathology, Gross And Microscopic Examination For Prostate Needle - 12/02/2016    GU PMH: Prostate Cancer - 12/25/2016    NON-GU PMH: Obstructive sleep apnea (adult) (pediatric), Obstructive sleep apnea, adult - 2015 Personal history of other diseases of the circulatory system, History of hypertension - 2015 Personal history of other endocrine, nutritional and metabolic disease, History of hypercholesterolemia - 2015 Hypertension    FAMILY HISTORY: cardiac disorder - Runs In Family Death of family member - Runs In Family   SOCIAL HISTORY: Marital Status: Married Current Smoking Status: Patient does not smoke anymore.   Tobacco Use Assessment Completed: Used Tobacco in last 30 days? Drinks 1 caffeinated drink per day.     Notes: Former smoker, Former smoker, Alcohol use, Occupation, Caffeine use, Number of children   REVIEW OF SYSTEMS:    GU Review Male:   Patient denies frequent urination, hard to postpone urination, burning/ pain with urination, get up at night to urinate, leakage of urine, stream starts and stops, trouble starting your streams, and have to strain to urinate .  Gastrointestinal (Lower):   Patient denies diarrhea and constipation.  Gastrointestinal (Upper):   Patient denies nausea and vomiting.  Constitutional:   Patient denies fever, night sweats, weight loss, and fatigue.  Skin:   Patient denies skin rash/ lesion and itching.  Eyes:   Patient reports blurred  vision. Patient denies double vision.  Ears/ Nose/ Throat:   Patient reports sinus problems. Patient denies sore throat.  Hematologic/Lymphatic:   Patient denies swollen glands and easy bruising.  Cardiovascular:   Patient denies leg swelling and chest pains.  Respiratory:   Patient denies cough and shortness of breath.  Endocrine:   Patient denies excessive thirst.  Musculoskeletal:   Patient denies back pain and joint pain.  Neurological:   Patient  denies headaches and dizziness.  Psychologic:   Patient denies depression and anxiety.   VITAL SIGNS:      01/21/2017 11:23 AM  Weight 200 lb / 90.72 kg  Height 67 in / 170.18 cm  BP 146/89 mmHg  Pulse 71 /min  BMI 31.3 kg/m   GU PHYSICAL EXAMINATION:    Prostate: Prostate about 40 grams. Left lobe normal consistency, right lobe normal consistency. Symmetrical lobes. No prostate nodule. Left lobe no tenderness, right lobe no tenderness.    MULTI-SYSTEM PHYSICAL EXAMINATION:    Constitutional: Well-nourished. No physical deformities. Normally developed. Good grooming.  Neck: Neck symmetrical, not swollen. Normal tracheal position.  Respiratory: No labored breathing, no use of accessory muscles. Clear bilaterally.  Cardiovascular: Normal temperature, normal extremity pulses, no swelling, no varicosities. Regular rate and rhythm.  Lymphatic: No enlargement of neck, axillae, groin.  Skin: No paleness, no jaundice, no cyanosis. No lesion, no ulcer, no rash.  Neurologic / Psychiatric: Oriented to time, oriented to place, oriented to person. No depression, no anxiety, no agitation.  Gastrointestinal: No mass, no tenderness, no rigidity, mildly obese abdomen.   Eyes: Normal conjunctivae. Normal eyelids.  Ears, Nose, Mouth, and Throat: Left ear no scars, no lesions, no masses. Right ear no scars, no lesions, no masses. Nose no scars, no lesions, no masses. Normal hearing. Normal lips.  Musculoskeletal: Normal gait and station of head and neck.     PAST DATA REVIEWED:  Source Of History:  Patient  Lab Test Review:   PSA  Records Review:   Pathology Reports  Urine Test Review:   Urinalysis   09/16/16 06/28/14 11/23/13  PSA  Total PSA 8.52 ng/dl 4.99  4.82   Free PSA 0.59 ng/dl 0.46  0.48   % Free PSA 7 % 9  10   Notes   Notify Patient -- PSA is slightly elevated. Give result. F/u as planned.  Result mailed.  bS SPECIMEN TYPE: BLOOD  TEST METHODOLOGY: ECLIA PSA  (ELECTROCHEMILUMINESCENCE IMMUNOASSAY)  PROBABILITY OF PROSTATE CANCER   (FOR MEN WITH NON-SUSPICIOUS DRE RESULTS AND PSA BETWEEN 4 AND   10 NG/ML, BY PATIENT AGE)     % FREE PSA                          PATIENT AGE                          50 TO 59 YEARS  60 TO 69 YEARS  >70 YEARS    <=10%                  49.2%           57.5%          64.5%    11 - 18%               26.9%           33.9%          40.8%  19 - 25%               18.3%           23.9%          29.7%    >25%                    9.1%           12.2%          15.8% I checked with pharmacy, both scripts ready for pick up.  BS RN  His PSA continues to rise and his percent free is low.  We need to proceed with prostate biopsy as we discussed.  I sent (Levaquin and Valium he was quite tense during the exam) for the biopsy.  Please call  with instructions. Wife  advised per DR. Eskridge note.  Pt needs bx and I have asked them to come by this week for bx instructions and abx to be sent in and consent signed.  bSRN SPECIMEN TYPE: BLOOD  RESULT REPEATED AND VERIFIED. TEST METHODOLOGY: ECLIA PSA (ELECTROCHEMILUMINESCENCE IMMUNOASSAY)  PROBABILITY OF PROSTATE CANCER   (FOR MEN WITH NON-SUSPICIOUS DRE RESULTS AND PSA BETWEEN 4 AND   10 NG/ML, BY PATIENT AGE)     % FREE PSA                          PATIENT AGE                          50 TO 59 YEARS  60 TO 69 YEARS  >70 YEARS    <=10%                  49.2%           57.5%          64.5%    11 - 18%               26.9%           33.9%          40.8%    19 - 25%               18.3%           23.9%          29.7%    >25%                    9.1%           12.2%          15.8%    PROCEDURES:          Urinalysis Dipstick Dipstick Cont'd  Color: Yellow Bilirubin: Neg  Appearance: Clear Ketones: Neg  Specific Gravity: 1.025 Blood: Neg  pH: 6.0 Protein: Neg  Glucose: Neg Urobilinogen: 0.2    Nitrites: Neg    Leukocyte Esterase: Neg    ASSESSMENT:      ICD-10 Details  1 GU:    Prostate Cancer - C61    PLAN:           Schedule Return Visit/Planned Activity: Next Available Appointment - PT/OT Referral             Note: Please schedule patient for preoperative PT prior to radical prostatectomy.    Return Visit/Planned Activity: Other See Visit Notes  Note: We'll go schedule surgery.          Document Letter(s):  Created for Patient: Clinical Summary         Notes:   1. Prostate cancer: I had a detailed discussion with Mr. Vanlanen and his wife today. The patient was counseled about the natural history of prostate cancer and the standard treatment options that are available for prostate cancer. It was explained to him how his age and life expectancy, clinical stage, Gleason score, and PSA affect his prognosis, the decision to proceed with additional staging studies, as well as how that information influences recommended treatment strategies. We discussed the roles for active surveillance, radiation therapy, surgical therapy, androgen deprivation, as well as ablative therapy options for the treatment of prostate cancer as appropriate to his individual cancer situation. We discussed the risks and benefits of these options with regard to their impact on cancer control and also in terms of potential adverse events, complications, and impact on quality of life particularly related to urinary and sexual function. The patient was encouraged to ask questions throughout the discussion today and all questions were answered to his stated satisfaction. In addition, the patient was provided with and/or directed to appropriate resources and literature for further education about prostate cancer and treatment options.   We discussed surgical therapy for prostate cancer including the different available surgical approaches. We discussed, in detail, the risks and expectations of surgery with regard to cancer control, urinary control, and erectile function as well as the expected  postoperative recovery process. Additional risks of surgery including but not limited to bleeding, infection, hernia formation, nerve damage, lymphocele formation, bowel/rectal injury potentially necessitating colostomy, damage to the urinary tract resulting in urine leakage, urethral stricture, and the cardiopulmonary risks such as myocardial infarction, stroke, death, venothromboembolism, etc. were explained. The risk of open surgical conversion for robotic/laparoscopic prostatectomy was also discussed.   After our discussion, he does adamantly wish to proceed with surgical therapy. I did offer him a radiation oncology consultation which he declined. He will be scheduled for a bilateral nerve sparing robot-assisted laparoscopic radical prostatectomy and bilateral pelvic lymphadenectomy.   Cc: Dr. Eliezer Lofts  Dr. Eda Keys          E & M CODE: I spent at least 40 minutes face to face with the patient, more than 50% of that time was spent on counseling and/or coordinating care.     * Signed by Raynelle Bring, M.D. on 01/21/17 at 10:18 PM (EDT)*

## 2017-02-16 ENCOUNTER — Encounter (HOSPITAL_COMMUNITY): Payer: Self-pay | Admitting: Anesthesiology

## 2017-02-16 NOTE — Anesthesia Preprocedure Evaluation (Signed)
Anesthesia Evaluation    Airway        Dental   Pulmonary sleep apnea and Continuous Positive Airway Pressure Ventilation , former smoker,           Cardiovascular hypertension, Pt. on medications      Neuro/Psych negative neurological ROS  negative psych ROS   GI/Hepatic negative GI ROS, Neg liver ROS,   Endo/Other  diabetes, Well Controlled, Type 2obesity  Renal/GU negative Renal ROS   Prostate Ca    Musculoskeletal negative musculoskeletal ROS (+)   Abdominal   Peds  Hematology negative hematology ROS (+)   Anesthesia Other Findings   Reproductive/Obstetrics                             Anesthesia Physical Anesthesia Plan  ASA: II  Anesthesia Plan: General   Post-op Pain Management:    Induction: Intravenous  PONV Risk Score and Plan: 4 or greater and Ondansetron, Dexamethasone, Midazolam, Scopolamine patch - Pre-op, Promethazine and Treatment may vary due to age or medical condition  Airway Management Planned: Oral ETT  Additional Equipment:   Intra-op Plan:   Post-operative Plan: Extubation in OR  Informed Consent:   Plan Discussed with:   Anesthesia Plan Comments:         Anesthesia Quick Evaluation

## 2017-02-17 ENCOUNTER — Ambulatory Visit (HOSPITAL_COMMUNITY): Payer: PPO | Admitting: Anesthesiology

## 2017-02-17 ENCOUNTER — Encounter (HOSPITAL_COMMUNITY)
Admission: RE | Disposition: A | Discharge status: Home / Self Care | Payer: Self-pay | Source: Ambulatory Visit | Attending: Urology

## 2017-02-17 ENCOUNTER — Observation Stay (HOSPITAL_COMMUNITY)
Admission: RE | Admit: 2017-02-17 | Discharge: 2017-02-18 | Disposition: A | Discharge status: Home / Self Care | Payer: PPO | Source: Ambulatory Visit | Attending: Urology | Admitting: Urology

## 2017-02-17 ENCOUNTER — Encounter (HOSPITAL_COMMUNITY): Payer: Self-pay | Admitting: *Deleted

## 2017-02-17 DIAGNOSIS — E669 Obesity, unspecified: Secondary | ICD-10-CM | POA: Insufficient documentation

## 2017-02-17 DIAGNOSIS — I10 Essential (primary) hypertension: Secondary | ICD-10-CM | POA: Insufficient documentation

## 2017-02-17 DIAGNOSIS — Z7982 Long term (current) use of aspirin: Secondary | ICD-10-CM | POA: Diagnosis not present

## 2017-02-17 DIAGNOSIS — E7800 Pure hypercholesterolemia, unspecified: Secondary | ICD-10-CM | POA: Insufficient documentation

## 2017-02-17 DIAGNOSIS — R1110 Vomiting, unspecified: Secondary | ICD-10-CM | POA: Insufficient documentation

## 2017-02-17 DIAGNOSIS — Z79899 Other long term (current) drug therapy: Secondary | ICD-10-CM | POA: Insufficient documentation

## 2017-02-17 DIAGNOSIS — Z9989 Dependence on other enabling machines and devices: Secondary | ICD-10-CM | POA: Insufficient documentation

## 2017-02-17 DIAGNOSIS — Z8546 Personal history of malignant neoplasm of prostate: Secondary | ICD-10-CM | POA: Diagnosis present

## 2017-02-17 DIAGNOSIS — E119 Type 2 diabetes mellitus without complications: Secondary | ICD-10-CM | POA: Insufficient documentation

## 2017-02-17 DIAGNOSIS — Z87891 Personal history of nicotine dependence: Secondary | ICD-10-CM | POA: Diagnosis not present

## 2017-02-17 DIAGNOSIS — Z6832 Body mass index (BMI) 32.0-32.9, adult: Secondary | ICD-10-CM | POA: Diagnosis not present

## 2017-02-17 DIAGNOSIS — C61 Malignant neoplasm of prostate: Principal | ICD-10-CM | POA: Insufficient documentation

## 2017-02-17 DIAGNOSIS — G4733 Obstructive sleep apnea (adult) (pediatric): Secondary | ICD-10-CM | POA: Insufficient documentation

## 2017-02-17 HISTORY — PX: ROBOT ASSISTED LAPAROSCOPIC RADICAL PROSTATECTOMY: SHX5141

## 2017-02-17 HISTORY — PX: LYMPHADENECTOMY: SHX5960

## 2017-02-17 LAB — GLUCOSE, CAPILLARY: Glucose-Capillary: 142 mg/dL — ABNORMAL HIGH (ref 65–99)

## 2017-02-17 LAB — HEMOGLOBIN AND HEMATOCRIT, BLOOD
HEMATOCRIT: 37.5 % — AB (ref 39.0–52.0)
HEMOGLOBIN: 12.6 g/dL — AB (ref 13.0–17.0)

## 2017-02-17 LAB — TYPE AND SCREEN
ABO/RH(D): O POS
ANTIBODY SCREEN: NEGATIVE

## 2017-02-17 SURGERY — XI ROBOTIC ASSISTED LAPAROSCOPIC RADICAL PROSTATECTOMY LEVEL 2
Anesthesia: General | Site: Abdomen

## 2017-02-17 MED ORDER — ROCURONIUM BROMIDE 50 MG/5ML IV SOSY
PREFILLED_SYRINGE | INTRAVENOUS | Status: AC
  Filled 2017-02-17: qty 5

## 2017-02-17 MED ORDER — HYDROMORPHONE HCL 2 MG/ML IJ SOLN
INTRAMUSCULAR | Status: AC
  Filled 2017-02-17: qty 1

## 2017-02-17 MED ORDER — HYDROCHLOROTHIAZIDE 12.5 MG PO CAPS
12.5000 mg | ORAL_CAPSULE | Freq: Every day | ORAL | Status: DC
  Administered 2017-02-17 – 2017-02-18 (×2): 12.5 mg via ORAL
  Filled 2017-02-17 (×2): qty 1

## 2017-02-17 MED ORDER — CEFAZOLIN SODIUM-DEXTROSE 1-4 GM/50ML-% IV SOLN
1.0000 g | Freq: Three times a day (TID) | INTRAVENOUS | Status: AC
  Administered 2017-02-17 (×2): 1 g via INTRAVENOUS
  Filled 2017-02-17 (×2): qty 50

## 2017-02-17 MED ORDER — SUFENTANIL CITRATE 50 MCG/ML IV SOLN
INTRAVENOUS | Status: AC
  Filled 2017-02-17: qty 1

## 2017-02-17 MED ORDER — MIDAZOLAM HCL 2 MG/2ML IJ SOLN
INTRAMUSCULAR | Status: AC
  Filled 2017-02-17: qty 2

## 2017-02-17 MED ORDER — SODIUM CHLORIDE 0.9 % IV BOLUS (SEPSIS)
1000.0000 mL | Freq: Once | INTRAVENOUS | Status: AC
  Administered 2017-02-17: 1000 mL via INTRAVENOUS

## 2017-02-17 MED ORDER — HYDROMORPHONE HCL 1 MG/ML IJ SOLN
INTRAMUSCULAR | Status: DC | PRN
  Administered 2017-02-17: 0.5 mg via INTRAVENOUS

## 2017-02-17 MED ORDER — SUGAMMADEX SODIUM 500 MG/5ML IV SOLN
INTRAVENOUS | Status: DC | PRN
  Administered 2017-02-17: 400 mg via INTRAVENOUS

## 2017-02-17 MED ORDER — PHENYLEPHRINE 40 MCG/ML (10ML) SYRINGE FOR IV PUSH (FOR BLOOD PRESSURE SUPPORT)
PREFILLED_SYRINGE | INTRAVENOUS | Status: DC | PRN
  Administered 2017-02-17: 80 ug via INTRAVENOUS
  Administered 2017-02-17 (×3): 40 ug via INTRAVENOUS

## 2017-02-17 MED ORDER — KCL IN DEXTROSE-NACL 20-5-0.45 MEQ/L-%-% IV SOLN
INTRAVENOUS | Status: DC
  Administered 2017-02-17 – 2017-02-18 (×3): via INTRAVENOUS
  Filled 2017-02-17 (×3): qty 1000

## 2017-02-17 MED ORDER — HYDROMORPHONE HCL-NACL 0.5-0.9 MG/ML-% IV SOSY
0.2500 mg | PREFILLED_SYRINGE | INTRAVENOUS | Status: DC | PRN
Start: 2017-02-17 — End: 2017-02-17

## 2017-02-17 MED ORDER — LORATADINE 10 MG PO TABS
10.0000 mg | ORAL_TABLET | Freq: Every day | ORAL | Status: DC
  Administered 2017-02-18: 10 mg via ORAL
  Filled 2017-02-17: qty 1

## 2017-02-17 MED ORDER — ROCURONIUM BROMIDE 100 MG/10ML IV SOLN
INTRAVENOUS | Status: DC | PRN
  Administered 2017-02-17: 50 mg via INTRAVENOUS
  Administered 2017-02-17: 10 mg via INTRAVENOUS
  Administered 2017-02-17: 20 mg via INTRAVENOUS
  Administered 2017-02-17: 10 mg via INTRAVENOUS

## 2017-02-17 MED ORDER — ONDANSETRON HCL 4 MG/2ML IJ SOLN
4.0000 mg | INTRAMUSCULAR | Status: DC | PRN
  Administered 2017-02-17: 4 mg via INTRAVENOUS
  Filled 2017-02-17: qty 2

## 2017-02-17 MED ORDER — ONDANSETRON HCL 4 MG/2ML IJ SOLN
INTRAMUSCULAR | Status: DC | PRN
  Administered 2017-02-17: 4 mg via INTRAVENOUS

## 2017-02-17 MED ORDER — BELLADONNA-OPIUM 16.2-30 MG RE SUPP
RECTAL | Status: AC
  Filled 2017-02-17: qty 1

## 2017-02-17 MED ORDER — SODIUM CHLORIDE 0.9 % IR SOLN
Status: DC | PRN
  Administered 2017-02-17: 1000 mL via INTRAVESICAL

## 2017-02-17 MED ORDER — ONDANSETRON HCL 4 MG/2ML IJ SOLN
INTRAMUSCULAR | Status: AC
  Filled 2017-02-17: qty 2

## 2017-02-17 MED ORDER — HEPARIN SODIUM (PORCINE) 1000 UNIT/ML IJ SOLN
INTRAMUSCULAR | Status: AC
  Filled 2017-02-17: qty 1

## 2017-02-17 MED ORDER — LOSARTAN POTASSIUM 50 MG PO TABS
50.0000 mg | ORAL_TABLET | Freq: Every day | ORAL | Status: DC
  Administered 2017-02-17 – 2017-02-18 (×2): 50 mg via ORAL
  Filled 2017-02-17 (×2): qty 1

## 2017-02-17 MED ORDER — DIPHENHYDRAMINE HCL 50 MG/ML IJ SOLN: 12.5000 mg | Freq: Four times a day (QID) | INTRAMUSCULAR | Status: DC | PRN

## 2017-02-17 MED ORDER — CEFAZOLIN SODIUM-DEXTROSE 2-4 GM/100ML-% IV SOLN
INTRAVENOUS | Status: AC
  Filled 2017-02-17: qty 100

## 2017-02-17 MED ORDER — KETOROLAC TROMETHAMINE 15 MG/ML IJ SOLN
15.0000 mg | Freq: Four times a day (QID) | INTRAMUSCULAR | Status: DC
  Administered 2017-02-17 – 2017-02-18 (×5): 15 mg via INTRAVENOUS
  Filled 2017-02-17 (×4): qty 1

## 2017-02-17 MED ORDER — NALOXONE HCL 0.4 MG/ML IJ SOLN
INTRAMUSCULAR | Status: DC | PRN
  Administered 2017-02-17: .02 mg via INTRAVENOUS

## 2017-02-17 MED ORDER — PROPOFOL 10 MG/ML IV BOLUS
INTRAVENOUS | Status: AC
  Filled 2017-02-17: qty 40

## 2017-02-17 MED ORDER — DOCUSATE SODIUM 100 MG PO CAPS
100.0000 mg | ORAL_CAPSULE | Freq: Two times a day (BID) | ORAL | Status: DC
  Administered 2017-02-17 – 2017-02-18 (×2): 100 mg via ORAL
  Filled 2017-02-17 (×2): qty 1

## 2017-02-17 MED ORDER — MEPERIDINE HCL 50 MG/ML IJ SOLN: 6.2500 mg | INTRAMUSCULAR | Status: DC | PRN

## 2017-02-17 MED ORDER — PROPOFOL 10 MG/ML IV BOLUS
INTRAVENOUS | Status: DC | PRN
  Administered 2017-02-17: 200 mg via INTRAVENOUS

## 2017-02-17 MED ORDER — BELLADONNA ALKALOIDS-OPIUM 16.2-60 MG RE SUPP
1.0000 | Freq: Once | RECTAL | Status: DC
  Administered 2017-02-17: 1 via RECTAL

## 2017-02-17 MED ORDER — ACETAMINOPHEN 325 MG PO TABS
650.0000 mg | ORAL_TABLET | ORAL | Status: DC | PRN
  Administered 2017-02-17 (×2): 650 mg via ORAL
  Filled 2017-02-17 (×2): qty 2

## 2017-02-17 MED ORDER — SULFAMETHOXAZOLE-TRIMETHOPRIM 800-160 MG PO TABS
1.0000 | ORAL_TABLET | Freq: Two times a day (BID) | ORAL | 0 refills | Status: DC
Start: 2017-02-17 — End: 2017-05-27

## 2017-02-17 MED ORDER — SUFENTANIL CITRATE 50 MCG/ML IV SOLN
INTRAVENOUS | Status: DC | PRN
  Administered 2017-02-17: 10 ug via INTRAVENOUS
  Administered 2017-02-17 (×4): 5 ug via INTRAVENOUS
  Administered 2017-02-17: 10 ug via INTRAVENOUS
  Administered 2017-02-17: 15 ug via INTRAVENOUS
  Administered 2017-02-17 (×2): 5 ug via INTRAVENOUS

## 2017-02-17 MED ORDER — LOSARTAN POTASSIUM-HCTZ 50-12.5 MG PO TABS: 1.0000 | ORAL_TABLET | Freq: Every day | ORAL | Status: DC

## 2017-02-17 MED ORDER — BUPIVACAINE-EPINEPHRINE (PF) 0.25% -1:200000 IJ SOLN
INTRAMUSCULAR | Status: AC
  Filled 2017-02-17: qty 30

## 2017-02-17 MED ORDER — MIDAZOLAM HCL 5 MG/5ML IJ SOLN
INTRAMUSCULAR | Status: DC | PRN
  Administered 2017-02-17 (×2): 1 mg via INTRAVENOUS

## 2017-02-17 MED ORDER — NALOXONE HCL 0.4 MG/ML IJ SOLN
INTRAMUSCULAR | Status: AC
  Filled 2017-02-17: qty 1

## 2017-02-17 MED ORDER — DEXAMETHASONE SODIUM PHOSPHATE 10 MG/ML IJ SOLN
INTRAMUSCULAR | Status: DC | PRN
  Administered 2017-02-17: 4 mg via INTRAVENOUS

## 2017-02-17 MED ORDER — KETOROLAC TROMETHAMINE 15 MG/ML IJ SOLN
INTRAMUSCULAR | Status: AC
  Filled 2017-02-17: qty 1

## 2017-02-17 MED ORDER — BUPIVACAINE-EPINEPHRINE 0.25% -1:200000 IJ SOLN
INTRAMUSCULAR | Status: DC | PRN
  Administered 2017-02-17: 30 mL

## 2017-02-17 MED ORDER — PROMETHAZINE HCL 25 MG/ML IJ SOLN: 6.2500 mg | INTRAMUSCULAR | Status: DC | PRN

## 2017-02-17 MED ORDER — HEPARIN SODIUM (PORCINE) 1000 UNIT/ML IJ SOLN
INTRAMUSCULAR | Status: DC | PRN
  Administered 2017-02-17: 1000 mL

## 2017-02-17 MED ORDER — HYDROCODONE-ACETAMINOPHEN 5-325 MG PO TABS: 1.0000 | ORAL_TABLET | Freq: Four times a day (QID) | ORAL | 0 refills | Status: DC | PRN

## 2017-02-17 MED ORDER — LIDOCAINE HCL (CARDIAC) 20 MG/ML IV SOLN
INTRAVENOUS | Status: DC | PRN
  Administered 2017-02-17: 50 mg via INTRAVENOUS

## 2017-02-17 MED ORDER — MORPHINE SULFATE (PF) 4 MG/ML IV SOLN
2.0000 mg | INTRAVENOUS | Status: DC | PRN
  Filled 2017-02-17: qty 1

## 2017-02-17 MED ORDER — LACTATED RINGERS IV SOLN
INTRAVENOUS | Status: DC | PRN
  Administered 2017-02-17 (×3): via INTRAVENOUS

## 2017-02-17 MED ORDER — LIDOCAINE 2% (20 MG/ML) 5 ML SYRINGE
INTRAMUSCULAR | Status: AC
  Filled 2017-02-17: qty 5

## 2017-02-17 MED ORDER — DIPHENHYDRAMINE HCL 12.5 MG/5ML PO ELIX: 12.5000 mg | ORAL_SOLUTION | Freq: Four times a day (QID) | ORAL | Status: DC | PRN

## 2017-02-17 MED ORDER — PHENYLEPHRINE 40 MCG/ML (10ML) SYRINGE FOR IV PUSH (FOR BLOOD PRESSURE SUPPORT)
PREFILLED_SYRINGE | INTRAVENOUS | Status: AC
  Filled 2017-02-17: qty 10

## 2017-02-17 MED ORDER — SODIUM CHLORIDE 0.9 % IJ SOLN
INTRAMUSCULAR | Status: AC
  Filled 2017-02-17: qty 10

## 2017-02-17 MED ORDER — CEFAZOLIN SODIUM-DEXTROSE 2-4 GM/100ML-% IV SOLN
2.0000 g | INTRAVENOUS | Status: AC
  Administered 2017-02-17: 2 g via INTRAVENOUS

## 2017-02-17 SURGICAL SUPPLY — 53 items
APPLICATOR COTTON TIP 6IN STRL (MISCELLANEOUS) ×4 IMPLANT
CATH FOLEY 2WAY SLVR 18FR 30CC (CATHETERS) ×4 IMPLANT
CATH ROBINSON RED A/P 16FR (CATHETERS) ×4 IMPLANT
CATH ROBINSON RED A/P 8FR (CATHETERS) ×4 IMPLANT
CATH TIEMANN FOLEY 18FR 5CC (CATHETERS) ×4 IMPLANT
CHLORAPREP W/TINT 26ML (MISCELLANEOUS) ×4 IMPLANT
CLIP LIGATING HEM O LOK PURPLE (MISCELLANEOUS) ×8 IMPLANT
COVER SURGICAL LIGHT HANDLE (MISCELLANEOUS) ×4 IMPLANT
COVER TIP SHEARS 8 DVNC (MISCELLANEOUS) ×2 IMPLANT
COVER TIP SHEARS 8MM DA VINCI (MISCELLANEOUS) ×2
CUTTER ECHEON FLEX ENDO 45 340 (ENDOMECHANICALS) ×4 IMPLANT
DECANTER SPIKE VIAL GLASS SM (MISCELLANEOUS) ×4 IMPLANT
DERMABOND ADVANCED (GAUZE/BANDAGES/DRESSINGS) ×2
DERMABOND ADVANCED .7 DNX12 (GAUZE/BANDAGES/DRESSINGS) ×2 IMPLANT
DRAPE ARM DVNC X/XI (DISPOSABLE) ×8 IMPLANT
DRAPE COLUMN DVNC XI (DISPOSABLE) ×2 IMPLANT
DRAPE DA VINCI XI ARM (DISPOSABLE) ×8
DRAPE DA VINCI XI COLUMN (DISPOSABLE) ×2
DRAPE SURG IRRIG POUCH 19X23 (DRAPES) ×4 IMPLANT
DRSG TEGADERM 4X4.75 (GAUZE/BANDAGES/DRESSINGS) ×4 IMPLANT
ELECT PENCIL ROCKER SW 15FT (MISCELLANEOUS) IMPLANT
ELECT REM PT RETURN 15FT ADLT (MISCELLANEOUS) ×4 IMPLANT
GLOVE BIO SURGEON STRL SZ 6.5 (GLOVE) ×3 IMPLANT
GLOVE BIO SURGEONS STRL SZ 6.5 (GLOVE) ×1
GLOVE BIOGEL M STRL SZ7.5 (GLOVE) ×8 IMPLANT
GOWN STRL REUS W/TWL LRG LVL3 (GOWN DISPOSABLE) ×12 IMPLANT
HOLDER FOLEY CATH W/STRAP (MISCELLANEOUS) ×4 IMPLANT
IRRIG SUCT STRYKERFLOW 2 WTIP (MISCELLANEOUS) ×4
IRRIGATION SUCT STRKRFLW 2 WTP (MISCELLANEOUS) ×2 IMPLANT
NDL SAFETY ECLIPSE 18X1.5 (NEEDLE) ×2 IMPLANT
NEEDLE HYPO 18GX1.5 SHARP (NEEDLE) ×2
PACK ROBOT UROLOGY CUSTOM (CUSTOM PROCEDURE TRAY) ×4 IMPLANT
POUCH SPECIMEN RETRIEVAL 10MM (ENDOMECHANICALS) ×4 IMPLANT
SEAL CANN UNIV 5-8 DVNC XI (MISCELLANEOUS) ×8 IMPLANT
SEAL XI 5MM-8MM UNIVERSAL (MISCELLANEOUS) ×8
SOLUTION ELECTROLUBE (MISCELLANEOUS) ×4 IMPLANT
SUT ETHILON 3 0 PS 1 (SUTURE) ×4 IMPLANT
SUT MNCRL 3 0 RB1 (SUTURE) ×2 IMPLANT
SUT MNCRL 3 0 VIOLET RB1 (SUTURE) ×2 IMPLANT
SUT MNCRL AB 4-0 PS2 18 (SUTURE) ×8 IMPLANT
SUT MONOCRYL 3 0 RB1 (SUTURE) ×4
SUT VIC AB 0 CT1 27 (SUTURE) ×2
SUT VIC AB 0 CT1 27XBRD ANTBC (SUTURE) ×2 IMPLANT
SUT VIC AB 0 UR5 27 (SUTURE) ×4 IMPLANT
SUT VIC AB 2-0 SH 27 (SUTURE) ×2
SUT VIC AB 2-0 SH 27X BRD (SUTURE) ×2 IMPLANT
SUT VIC AB 3-0 SH 27 (SUTURE) ×2
SUT VIC AB 3-0 SH 27X BRD (SUTURE) ×2 IMPLANT
SUT VICRYL 0 UR6 27IN ABS (SUTURE) ×8 IMPLANT
SYR 27GX1/2 1ML LL SAFETY (SYRINGE) ×4 IMPLANT
TOWEL OR 17X26 10 PK STRL BLUE (TOWEL DISPOSABLE) ×4 IMPLANT
TOWEL OR NON WOVEN STRL DISP B (DISPOSABLE) ×4 IMPLANT
TROCAR BLADELESS OPT 5 100 (ENDOMECHANICALS) ×4 IMPLANT

## 2017-02-17 NOTE — Progress Notes (Signed)
Post-op note  Subjective: The patient is doing well.  Having some back pain. Pain controlled with medication. Nauseated initially, now resolved. Hgb 12.6 from 14.4 preop.   Objective: Vital signs in last 24 hours: Temp:  [96 F (35.6 C)-98.5 F (36.9 C)] 97.9 F (36.6 C) (08/06 1310) Pulse Rate:  [75-94] 94 (08/06 1310) Resp:  [6-16] 14 (08/06 1310) BP: (127-154)/(68-98) 133/90 (08/06 1310) SpO2:  [95 %-100 %] 95 % (08/06 1310) Weight:  [93 kg (205 lb)-94.8 kg (208 lb 15.9 oz)] 94.8 kg (208 lb 15.9 oz) (08/06 1310)  Intake/Output from previous day: No intake/output data recorded. Intake/Output this shift: Total I/O In: 3217.5 [P.O.:480; I.V.:2687.5; IV Piggyback:50] Out: 360 [Urine:100; Drains:210; Blood:50]  Physical Exam:  General: Alert and oriented. Abdomen: Soft, Nondistended. Incisions CDI. JP with serosanguinous drainage.  GU: foley catheter to drainage, draining clear yellow urine with sediment  Lab Results:  Recent Labs  02/17/17 1145  HGB 12.6*  HCT 37.5*    Assessment/Plan: POD#0 s/p RALP  1) Continue to monitor 2) Ambulate this PM  3) Continue clears    Jonna Clark, MD   LOS: 0 days   FILIPPOU, PAULINE L 02/17/2017, 6:00 PM      \

## 2017-02-17 NOTE — Interval H&P Note (Signed)
History and Physical Interval Note:  02/17/2017 6:47 AM  Chase Moreno  has presented today for surgery, with the diagnosis of PROSTATE CANCER  The various methods of treatment have been discussed with the patient and family. After consideration of risks, benefits and other options for treatment, the patient has consented to  Procedure(s): XI ROBOTIC [place] 2 (N/A) LYMPHADENECTOMY (Bilateral) as a surgical intervention .  The patient's history has been reviewed, patient examined, no change in status, stable for surgery.  I have reviewed the patient's chart and labs.  Questions were answered to the patient's satisfaction.     Shantese Raven,LES

## 2017-02-17 NOTE — Op Note (Signed)
Preoperative diagnosis: Clinically localized adenocarcinoma of the prostate (clinical stage T1c Nx Mx)  Postoperative diagnosis: Clinically localized adenocarcinoma of the prostate (clinical stage T1c Nx Mx)  Procedure:  1. Robotic assisted laparoscopic radical prostatectomy (bilateral nerve sparing) 2. Bilateral robotic assisted laparoscopic pelvic lymphadenectomy  Surgeon: Pryor Curia. M.D.  Assistant: Debbrah Alar, PA-C  An assistant was required for this surgical procedure.  The duties of the assistant included but were not limited to suctioning, passing suture, camera manipulation, retraction. This procedure would not be able to be performed without an Environmental consultant.  Resident: Dr. Ammie Dalton  Anesthesia: General  Complications: None  EBL: 50 mL  IVF:  2000 mL crystalloid  Specimens: 1. Prostate and seminal vesicles 2. Right pelvic lymph nodes 3. Left pelvic lymph nodes  Disposition of specimens: Pathology  Drains: 1. 20 Fr coude catheter 2. # 19 Blake pelvic drain  Indication: Chase Moreno is a 66 y.o. year old patient with clinically localized prostate cancer.  After a thorough review of the management options for treatment of prostate cancer, he elected to proceed with surgical therapy and the above procedure(s).  We have discussed the potential benefits and risks of the procedure, side effects of the proposed treatment, the likelihood of the patient achieving the goals of the procedure, and any potential problems that might occur during the procedure or recuperation. Informed consent has been obtained.  Description of procedure:  The patient was taken to the operating room and a general anesthetic was administered. He was given preoperative antibiotics, placed in the dorsal lithotomy position, and prepped and draped in the usual sterile fashion. Next a preoperative timeout was performed. A urethral catheter was placed into the bladder and a site was  selected near the umbilicus for placement of the camera port. This was placed using a standard open Hassan technique which allowed entry into the peritoneal cavity under direct vision and without difficulty. An 8 mm robotic port was placed and a pneumoperitoneum established. The camera was then used to inspect the abdomen and there was no evidence of any intra-abdominal injuries or other abnormalities. The remaining abdominal ports were then placed. 8 mm robotic ports were placed in the right lower quadrant, left lower quadrant, and far left lateral abdominal wall. A 5 mm port was placed in the right upper quadrant and a 12 mm port was placed in the right lateral abdominal wall for laparoscopic assistance. All ports were placed under direct vision without difficulty. The surgical cart was then docked.   Utilizing the cautery scissors, the bladder was reflected posteriorly allowing entry into the space of Retzius and identification of the endopelvic fascia and prostate. The periprostatic fat was then removed from the prostate allowing full exposure of the endopelvic fascia. The endopelvic fascia was then incised from the apex back to the base of the prostate bilaterally and the underlying levator muscle fibers were swept laterally off the prostate thereby isolating the dorsal venous complex. The dorsal vein was then stapled and divided with a 45 mm Flex Echelon stapler. Attention then turned to the bladder neck which was divided anteriorly thereby allowing entry into the bladder and exposure of the urethral catheter. The catheter balloon was deflated and the catheter was brought into the operative field and used to retract the prostate anteriorly. The posterior bladder neck was then examined and was divided allowing further dissection between the bladder and prostate posteriorly until the vasa deferentia and seminal vessels were identified. The vasa deferentia were isolated,  divided, and lifted anteriorly. The  seminal vesicles were dissected down to their tips with care to control the seminal vascular arterial blood supply. These structures were then lifted anteriorly and the space between Denonvillier's fascia and the anterior rectum was developed with a combination of sharp and blunt dissection. This isolated the vascular pedicles of the prostate.  The lateral prostatic fascia was then sharply incised allowing release of the neurovascular bundles bilaterally. The vascular pedicles of the prostate were then ligated with Weck clips between the prostate and neurovascular bundles and divided with sharp cold scissor dissection resulting in neurovascular bundle preservation. The neurovascular bundles were then separated off the apex of the prostate and urethra bilaterally.  The urethra was then sharply transected allowing the prostate specimen to be disarticulated. The pelvis was copiously irrigated and hemostasis was ensured. There was no evidence for rectal injury.  Attention then turned to the right pelvic sidewall. The fibrofatty tissue between the external iliac vein, confluence of the iliac vessels, hypogastric artery, and Cooper's ligament was dissected free from the pelvic sidewall with care to preserve the obturator nerve. Weck clips were used for lymphostasis and hemostasis. An identical procedure was performed on the contralateral side and the lymphatic packets were removed for permanent pathologic analysis.  Figure of eight 3-0 Vicryl sutures were used for additional hemostasis around the neurovascular bundles. Attention then turned to the urethral anastomosis. A 2-0 Vicryl slip knot was placed between Denonvillier's fascia, the posterior bladder neck, and the posterior urethra to reapproximate these structures. A double-armed 3-0 Monocryl suture was then used to perform a 360 running tension-free anastomosis between the bladder neck and urethra. A new urethral catheter was then placed into the bladder  and irrigated. There were no blood clots within the bladder and the anastomosis appeared to be watertight. A #19 Blake drain was then brought through the left lateral 8 mm port site and positioned appropriately within the pelvis. It was secured to the skin with a nylon suture. The surgical cart was then undocked. The right lateral 12 mm port site was closed at the fascial level with a 0 Vicryl suture placed laparoscopically. All remaining ports were then removed under direct vision. The prostate specimen was removed intact within the Endopouch retrieval bag via the periumbilical camera port site. This fascial opening was closed with two running 0 Vicryl sutures. 0.25% Marcaine was then injected into all port sites and all incisions were reapproximated at the skin level with 4-0 Monocryl subcuticular sutures and Liquiband. The patient appeared to tolerate the procedure well and without complications. The patient was able to be extubated and transferred to the recovery unit in satisfactory condition.   Pryor Curia MD

## 2017-02-17 NOTE — Anesthesia Postprocedure Evaluation (Signed)
Anesthesia Post Note  Patient: Bonnita Levan  Procedure(s) Performed: Procedure(s) (LRB): XI ROBOTIC ASSISTED LAPAROSCOPIC RADICAL PROSTATECTOMY LEVEL 2 (N/A) LYMPHADENECTOMY (Bilateral)     Patient location during evaluation: PACU Anesthesia Type: General Level of consciousness: awake and alert and oriented Pain management: pain level controlled Vital Signs Assessment: post-procedure vital signs reviewed and stable Respiratory status: spontaneous breathing, nonlabored ventilation, respiratory function stable and patient connected to nasal cannula oxygen Cardiovascular status: blood pressure returned to baseline and stable Postop Assessment: no signs of nausea or vomiting Anesthetic complications: no Comments: Had to administer Naloxone at end of procedure.    Last Vitals:  Vitals:   02/17/17 1230 02/17/17 1245  BP: (!) 144/94 (!) 140/96  Pulse: 85 93  Resp: 11 12  Temp:  (!) 36.3 C    Last Pain:  Vitals:   02/17/17 1245  TempSrc:   PainSc: Asleep                 Dimitri Shakespeare A.

## 2017-02-17 NOTE — Anesthesia Procedure Notes (Signed)
Performed by: Garrel Ridgel

## 2017-02-17 NOTE — Progress Notes (Signed)
Received patient from PACU, VS obtained, IVFs infusing, JP drain and FC intact, no voiced c/o at this time

## 2017-02-17 NOTE — Anesthesia Procedure Notes (Signed)
Procedure Name: Intubation Date/Time: 02/17/2017 7:36 AM Performed by: Sherian Maroon A Pre-anesthesia Checklist: Suction available, Emergency Drugs available, Patient identified, Patient being monitored and Timeout performed Patient Re-evaluated:Patient Re-evaluated prior to induction Oxygen Delivery Method: Circle system utilized and Simple face mask Preoxygenation: Pre-oxygenation with 100% oxygen Induction Type: IV induction Ventilation: Mask ventilation without difficulty Laryngoscope Size: Glidescope Grade View: Grade III Tube type: Oral Tube size: 7.5 mm Number of attempts: 1 Airway Equipment and Method: Video-laryngoscopy Secured at: 22 cm Tube secured with: Tape Dental Injury: Teeth and Oropharynx as per pre-operative assessment  Difficulty Due To: Difficulty was anticipated, Difficult Airway- due to reduced neck mobility and Difficult Airway- due to dentition

## 2017-02-17 NOTE — Anesthesia Preprocedure Evaluation (Signed)
Anesthesia Evaluation  Patient identified by MRN, date of birth, ID band Patient awake    Reviewed: Allergy & Precautions, NPO status , Patient's Chart, lab work & pertinent test results  Airway Mallampati: II  TM Distance: >3 FB Neck ROM: Full    Dental  (+) Teeth Intact, Caps   Pulmonary sleep apnea and Continuous Positive Airway Pressure Ventilation , former smoker,    Pulmonary exam normal breath sounds clear to auscultation       Cardiovascular hypertension, Pt. on medications Normal cardiovascular exam Rhythm:Regular Rate:Normal     Neuro/Psych negative neurological ROS  negative psych ROS   GI/Hepatic negative GI ROS, Neg liver ROS,   Endo/Other  diabetes, Well Controlled, Type 2obesity  Renal/GU negative Renal ROS   Prostate Ca    Musculoskeletal negative musculoskeletal ROS (+)   Abdominal (+) + obese,   Peds  Hematology negative hematology ROS (+)   Anesthesia Other Findings   Reproductive/Obstetrics                             Anesthesia Physical  Anesthesia Plan  ASA: II  Anesthesia Plan: General   Post-op Pain Management:    Induction: Intravenous  PONV Risk Score and Plan: 4 or greater and Ondansetron, Dexamethasone, Midazolam, Scopolamine patch - Pre-op, Promethazine and Treatment may vary due to age or medical condition  Airway Management Planned: Oral ETT  Additional Equipment:   Intra-op Plan:   Post-operative Plan: Extubation in OR  Informed Consent: I have reviewed the patients History and Physical, chart, labs and discussed the procedure including the risks, benefits and alternatives for the proposed anesthesia with the patient or authorized representative who has indicated his/her understanding and acceptance.   Dental advisory given  Plan Discussed with: CRNA, Anesthesiologist and Surgeon  Anesthesia Plan Comments:         Anesthesia Quick  Evaluation

## 2017-02-17 NOTE — Anesthesia Procedure Notes (Signed)
Procedure Name: Intubation Date/Time: 02/17/2017 7:36 AM Performed by: Sherian Maroon A Pre-anesthesia Checklist: Patient identified, Emergency Drugs available, Suction available, Patient being monitored and Timeout performed Patient Re-evaluated:Patient Re-evaluated prior to induction Oxygen Delivery Method: Circle system utilized Preoxygenation: Pre-oxygenation with 100% oxygen Induction Type: IV induction Ventilation: Mask ventilation without difficulty Grade View: Grade III Tube type: Oral Tube size: 7.5 mm Number of attempts: 2 Airway Equipment and Method: Video-laryngoscopy Placement Confirmation: ETT inserted through vocal cords under direct vision Secured at: 22 cm Tube secured with: Tape Dental Injury: Teeth and Oropharynx as per pre-operative assessment  Difficulty Due To: Difficulty was anticipated, Difficult Airway- due to reduced neck mobility and Difficult Airway- due to dentition

## 2017-02-17 NOTE — Discharge Instructions (Signed)

## 2017-02-17 NOTE — Transfer of Care (Signed)
Immediate Anesthesia Transfer of Care Note  Patient: Chase Moreno  Procedure(s) Performed: Procedure(s): XI ROBOTIC ASSISTED LAPAROSCOPIC RADICAL PROSTATECTOMY LEVEL 2 (N/A) LYMPHADENECTOMY (Bilateral)  Patient Location: PACU  Anesthesia Type:General  Level of Consciousness: awake, alert  and oriented  Airway & Oxygen Therapy: Patient Spontanous Breathing and Patient connected to face mask oxygen  Post-op Assessment: Report given to RN and Post -op Vital signs reviewed and stable  Post vital signs: Reviewed and stable  Last Vitals:  Vitals:   02/17/17 0600  BP: 130/87  Pulse: 75  Resp: 16  Temp: 36.9 C    Last Pain:  Vitals:   02/17/17 0620  TempSrc:   PainSc: 0-No pain      Patients Stated Pain Goal: 3 (38/[id number])  Complications: No apparent anesthesia complications

## 2017-02-18 DIAGNOSIS — C61 Malignant neoplasm of prostate: Secondary | ICD-10-CM | POA: Diagnosis not present

## 2017-02-18 LAB — HEMOGLOBIN AND HEMATOCRIT, BLOOD
HEMATOCRIT: 36 % — AB (ref 39.0–52.0)
Hemoglobin: 12.2 g/dL — ABNORMAL LOW (ref 13.0–17.0)

## 2017-02-18 MED ORDER — BISACODYL 10 MG RE SUPP
10.0000 mg | Freq: Once | RECTAL | Status: AC
  Administered 2017-02-18: 10 mg via RECTAL

## 2017-02-18 MED ORDER — HYDROCODONE-ACETAMINOPHEN 5-325 MG PO TABS: 1.0000 | ORAL_TABLET | Freq: Four times a day (QID) | ORAL | Status: DC | PRN

## 2017-02-18 MED ORDER — BISACODYL 10 MG RE SUPP
10.0000 mg | Freq: Once | RECTAL | Status: DC
  Filled 2017-02-18 (×2): qty 1

## 2017-02-18 NOTE — Care Management Note (Signed)
Case Management Note  Patient Details  Name: Chase Moreno MRN: [id number] Date of Birth: May 18, 1951  Subjective/Objective: 66 y/o m admitted w/Prostate Ca. From home.                   Action/Plan:d/c home.   Expected Discharge Date:  02/18/17               Expected Discharge Plan:  Home/Self Care  In-House Referral:     Discharge planning Services  CM Consult  Post Acute Care Choice:    Choice offered to:     DME Arranged:    DME Agency:     HH Arranged:    HH Agency:     Status of Service:  Completed, signed off  If discussed at [employer] of Stay Meetings, dates discussed:    Additional Comments:  Dessa Phi, RN 02/18/2017, 1:12 PM

## 2017-02-18 NOTE — Discharge Summary (Signed)
  Date of admission: 02/17/2017  Date of discharge: 02/18/2017  Admission diagnosis: Prostate Cancer  Discharge diagnosis: Prostate Cancer  History and Physical: For full details, please see admission history and physical. Briefly, Chase Moreno is a 66 y.o. gentleman with localized prostate cancer.  After discussing management/treatment options, he elected to proceed with surgical treatment.  Hospital Course: Chase Moreno was taken to the operating room on 02/17/2017 and underwent a robotic assisted laparoscopic radical prostatectomy. He tolerated this procedure well and without complications. Postoperatively, he was able to be transferred to a regular hospital room following recovery from anesthesia.  He was able to begin ambulating the night of surgery. He remained hemodynamically stable overnight.  He had excellent urine output with appropriately minimal output from his pelvic drain and his pelvic drain was removed on POD #1.  He was transitioned to oral pain medication, tolerated a clear liquid diet, and had met all discharge criteria and was able to be discharged home later on POD#1.  Laboratory values:  Recent Labs  02/17/17 1145 02/18/17 0526  HGB 12.6* 12.2*  HCT 37.5* 36.0*    Disposition: Home  Discharge instruction: He was instructed to be ambulatory but to refrain from heavy lifting, strenuous activity, or driving. He was instructed on urethral catheter care.  Discharge medications:  Allergies as of 02/18/2017   No Known Allergies     Medication List    STOP taking these medications   aspirin 81 MG tablet     TAKE these medications   HYDROcodone-acetaminophen 5-325 MG tablet Commonly known as:  NORCO Take 1-2 tablets by mouth every 6 (six) hours as needed for moderate pain or severe pain.   loratadine 10 MG tablet Commonly known as:  CLARITIN Take 10 mg by mouth daily.   losartan-hydrochlorothiazide 50-12.5 MG tablet Commonly known as:  HYZAAR TAKE ONE TABLET BY  MOUTH ONCE DAILY   sulfamethoxazole-trimethoprim 800-160 MG tablet Commonly known as:  BACTRIM DS,SEPTRA DS Take 1 tablet by mouth 2 (two) times daily. Start the day prior to foley removal appointment       Followup: He will followup in 1 week for catheter removal and to discuss his surgical pathology results.

## 2017-02-18 NOTE — Care Management Obs Status (Signed)
[facility] NOTIFICATION   Patient Details  Name: Chase Moreno MRN: [id number] Date of Birth: 05/01/1951   Medicare Observation Status Notification Given:  Yes    MahabirJuliann Pulse, RN 02/18/2017, 1:12 PM

## 2017-02-18 NOTE — Progress Notes (Signed)
Patient is stable for discharge. Discharge instructions and medications have been reviewed with the patient and all questions answered. Reviewed foley care and leg bag teaching and post-op care with the patient and his wife. They verbalized and demonstrated understanding.

## 2017-02-18 NOTE — Progress Notes (Signed)
Patient ID: Chase Moreno, male   DOB: November 07, 1950, 66 y.o.   MRN: [id number]   1 Day Post-Op Subjective: The patient is doing well. Emesis x2 last night. No nausea or vomiting overnight. Pain is adequately controlled.  Objective: Vital signs in last 24 hours: Temp:  [96 F (35.6 C)-98.4 F (36.9 C)] 98.3 F (36.8 C) (08/07 0652) Pulse Rate:  [79-100] 90 (08/07 0652) Resp:  [6-20] 18 (08/07 0652) BP: (122-154)/(68-98) 135/73 (08/07 0652) SpO2:  [95 %-100 %] 98 % (08/07 0652) Weight:  [94.8 kg (208 lb 15.9 oz)] 94.8 kg (208 lb 15.9 oz) (08/06 1310)  Intake/Output from previous day: 08/06 0701 - 08/07 0700 In: 5522.5 [P.O.:780; I.V.:4692.5; IV Piggyback:50] Out: 2870 [Urine:2420; Drains:400; Blood:50] Intake/Output this shift: No intake/output data recorded.  Physical Exam:  General: Alert and oriented. CV: RRR Lungs: Clear bilaterally. GI: Soft, Nondistended. Incisions: Clean, dry, and intact Urine: Clear Extremities: Nontender, no erythema, no edema.  Lab Results:  Recent Labs  02/17/17 1145 02/18/17 0526  HGB 12.6* 12.2*  HCT 37.5* 36.0*      Assessment/Plan: POD# 1 s/p robotic prostatectomy.  1) SL IVF 2) Ambulate, Incentive spirometry 3) Transition to oral pain medication 4) Dulcolax suppository 5) D/C pelvic drain 6) Plan for likely discharge later today   Pryor Curia. MD   LOS: 0 days   Travers Goodley,LES 02/18/2017, 7:31 AM

## 2017-02-27 ENCOUNTER — Telehealth: Payer: Self-pay | Admitting: Family Medicine

## 2017-02-27 NOTE — Telephone Encounter (Signed)
Left message with pt wife asking to call Ebony Hail back directly at [phone] to schedule AWV + labs with Katha Cabal and CPE with PCP.  *NOTE* Pt Welcome Exam was 11/28/15

## 2017-03-20 DIAGNOSIS — N393 Stress incontinence (female) (male): Secondary | ICD-10-CM | POA: Diagnosis not present

## 2017-03-20 DIAGNOSIS — M6281 Muscle weakness (generalized): Secondary | ICD-10-CM | POA: Diagnosis not present

## 2017-03-20 DIAGNOSIS — M62838 Other muscle spasm: Secondary | ICD-10-CM | POA: Diagnosis not present

## 2017-04-02 ENCOUNTER — Telehealth: Payer: Self-pay | Admitting: Family Medicine

## 2017-04-02 DIAGNOSIS — E119 Type 2 diabetes mellitus without complications: Secondary | ICD-10-CM

## 2017-04-02 DIAGNOSIS — E7800 Pure hypercholesterolemia, unspecified: Secondary | ICD-10-CM

## 2017-04-02 NOTE — Telephone Encounter (Signed)
Scheduled 04/03/17

## 2017-04-02 NOTE — Telephone Encounter (Signed)
-----   Message from Eustace Pen, LPN sent at 8/83/2549 12:49 PM EDT ----- Regarding: Labs 9/20 [employer]  Per Health Maintenance, A1C needed

## 2017-04-03 ENCOUNTER — Ambulatory Visit: Payer: PPO

## 2017-04-04 DIAGNOSIS — N393 Stress incontinence (female) (male): Secondary | ICD-10-CM | POA: Diagnosis not present

## 2017-04-04 DIAGNOSIS — M6281 Muscle weakness (generalized): Secondary | ICD-10-CM | POA: Diagnosis not present

## 2017-04-04 DIAGNOSIS — M62838 Other muscle spasm: Secondary | ICD-10-CM | POA: Diagnosis not present

## 2017-04-11 ENCOUNTER — Encounter: Payer: PPO | Admitting: Family Medicine

## 2017-04-22 DIAGNOSIS — N393 Stress incontinence (female) (male): Secondary | ICD-10-CM | POA: Diagnosis not present

## 2017-04-22 DIAGNOSIS — M62838 Other muscle spasm: Secondary | ICD-10-CM | POA: Diagnosis not present

## 2017-04-22 DIAGNOSIS — M6281 Muscle weakness (generalized): Secondary | ICD-10-CM | POA: Diagnosis not present

## 2017-04-23 ENCOUNTER — Ambulatory Visit: Payer: PPO

## 2017-05-14 DIAGNOSIS — M62838 Other muscle spasm: Secondary | ICD-10-CM | POA: Diagnosis not present

## 2017-05-14 DIAGNOSIS — N393 Stress incontinence (female) (male): Secondary | ICD-10-CM | POA: Diagnosis not present

## 2017-05-14 DIAGNOSIS — M6281 Muscle weakness (generalized): Secondary | ICD-10-CM | POA: Diagnosis not present

## 2017-05-14 DIAGNOSIS — C61 Malignant neoplasm of prostate: Secondary | ICD-10-CM | POA: Diagnosis not present

## 2017-05-21 DIAGNOSIS — C61 Malignant neoplasm of prostate: Secondary | ICD-10-CM | POA: Diagnosis not present

## 2017-05-21 DIAGNOSIS — N5201 Erectile dysfunction due to arterial insufficiency: Secondary | ICD-10-CM | POA: Diagnosis not present

## 2017-05-21 DIAGNOSIS — N393 Stress incontinence (female) (male): Secondary | ICD-10-CM | POA: Diagnosis not present

## 2017-05-27 ENCOUNTER — Ambulatory Visit (INDEPENDENT_AMBULATORY_CARE_PROVIDER_SITE_OTHER): Payer: PPO

## 2017-05-27 VITALS — BP 120/84 | HR 63 | Temp 98.0°F | Ht 66.0 in | Wt 202.0 lb

## 2017-05-27 DIAGNOSIS — E7800 Pure hypercholesterolemia, unspecified: Secondary | ICD-10-CM | POA: Diagnosis not present

## 2017-05-27 DIAGNOSIS — E119 Type 2 diabetes mellitus without complications: Secondary | ICD-10-CM | POA: Diagnosis not present

## 2017-05-27 DIAGNOSIS — Z23 Encounter for immunization: Secondary | ICD-10-CM | POA: Diagnosis not present

## 2017-05-27 DIAGNOSIS — Z0000 Encounter for general adult medical examination without abnormal findings: Secondary | ICD-10-CM | POA: Diagnosis not present

## 2017-05-27 LAB — LIPID PANEL
CHOL/HDL RATIO: 5
Cholesterol: 205 mg/dL — ABNORMAL HIGH (ref 0–200)
HDL: 42 mg/dL (ref >39.00)
LDL CALC: 147 mg/dL — AB (ref 0–99)
NonHDL: 163.39
TRIGLYCERIDES: 82 mg/dL (ref 0.0–149.0)
VLDL: 16.4 mg/dL (ref 0.0–40.0)

## 2017-05-27 LAB — COMPREHENSIVE METABOLIC PANEL
ALT: 14 U/L (ref 0–53)
AST: 20 U/L (ref 0–37)
Albumin: 4.1 g/dL (ref 3.5–5.2)
Alkaline Phosphatase: 53 U/L (ref 39–117)
BUN: 10 mg/dL (ref 6–23)
CALCIUM: 9.4 mg/dL (ref 8.4–10.5)
CHLORIDE: 105 meq/L (ref 96–112)
CO2: 30 meq/L (ref 19–32)
Creatinine, Ser: 1.23 mg/dL (ref 0.40–1.50)
GFR: 75.42 mL/min (ref >60.00)
Glucose, Bld: 98 mg/dL (ref 70–99)
Potassium: 4.6 mEq/L (ref 3.5–5.1)
Sodium: 139 mEq/L (ref 135–145)
Total Bilirubin: 0.6 mg/dL (ref 0.2–1.2)
Total Protein: 7.8 g/dL (ref 6.0–8.3)

## 2017-05-27 LAB — HEMOGLOBIN A1C: Hgb A1c MFr Bld: 5.7 % (ref 4.6–6.5)

## 2017-05-27 NOTE — Patient Instructions (Signed)
Mr. Schertzer , Thank you for taking time to come for your Medicare Wellness Visit. I appreciate your ongoing commitment to your health goals. Please review the following plan we discussed and let me know if I can assist you in the future.   These are the goals we discussed: Goals    Starting 05/27/2017, I will continue to walk or to hunt for at least 60 min 3-4 days per week.       This is a list of the screening recommended for you and due dates:  Health Maintenance  Topic Date Due  . Complete foot exam   05/29/2017*  . Eye exam for diabetics  05/27/2018*  . Tetanus Vaccine  08/23/2017  . Colon Cancer Screening  10/08/2017  . Hemoglobin A1C  11/24/2017  . Flu Shot  Completed  .  Hepatitis C: One time screening is recommended by Center for Disease Control  (CDC) for  adults born from [birth year] through 1965.   Completed  . Pneumonia vaccines  Completed  *Topic was postponed. The date shown is not the original due date.   Preventive Care for Adults  A healthy lifestyle and preventive care can promote health and wellness. Preventive health guidelines for adults include the following key practices.  . A routine yearly physical is a good way to check with your health care provider about your health and preventive screening. It is a chance to share any concerns and updates on your health and to receive a thorough exam.  . Visit your dentist for a routine exam and preventive care every 6 months. Brush your teeth twice a day and floss once a day. Good oral hygiene prevents tooth decay and gum disease.  . The frequency of eye exams is based on your age, health, family medical history, use  of contact lenses, and other factors. Follow your health care provider's recommendations for frequency of eye exams.  . Eat a healthy diet. Foods like vegetables, fruits, whole grains, low-fat dairy products, and lean protein foods contain the nutrients you need without too many calories. Decrease your intake of  foods high in solid fats, added sugars, and salt. Eat the right amount of calories for you. Get information about a proper diet from your health care provider, if necessary.  . Regular physical exercise is one of the most important things you can do for your health. Most adults should get at least 150 minutes of moderate-intensity exercise (any activity that increases your heart rate and causes you to sweat) each week. In addition, most adults need muscle-strengthening exercises on 2 or more days a week.  Silver Sneakers may be a benefit available to you. To determine eligibility, you may visit the website: www.silversneakers.com or contact program at [phone] Mon-Fri between 8AM-8PM.   . Maintain a healthy weight. The body mass index (BMI) is a screening tool to identify possible weight problems. It provides an estimate of body fat based on height and weight. Your health care provider can find your BMI and can help you achieve or maintain a healthy weight.   For adults 20 years and older: ? A BMI below 18.5 is considered underweight. ? A BMI of 18.5 to 24.9 is normal. ? A BMI of 25 to 29.9 is considered overweight. ? A BMI of 30 and above is considered obese.   . Maintain normal blood lipids and cholesterol levels by exercising and minimizing your intake of saturated fat. Eat a balanced diet with plenty of fruit and vegetables.  Blood tests for lipids and cholesterol should begin at age 55 and be repeated every 5 years. If your lipid or cholesterol levels are high, you are over 50, or you are at high risk for heart disease, you may need your cholesterol levels checked more frequently. Ongoing high lipid and cholesterol levels should be treated with medicines if diet and exercise are not working.  . If you smoke, find out from your health care provider how to quit. If you do not use tobacco, please do not start.  . If you choose to drink alcohol, please do not consume more than 2 drinks per  day. One drink is considered to be 12 ounces (355 mL) of beer, 5 ounces (148 mL) of wine, or 1.5 ounces (44 mL) of liquor.  . If you are 75-97 years old, ask your health care provider if you should take aspirin to prevent strokes.  . Use sunscreen. Apply sunscreen liberally and repeatedly throughout the day. You should seek shade when your shadow is shorter than you. Protect yourself by wearing long sleeves, pants, a wide-brimmed hat, and sunglasses year round, whenever you are outdoors.  . Once a month, do a whole body skin exam, using a mirror to look at the skin on your back. Tell your health care provider of new moles, moles that have irregular borders, moles that are larger than a pencil eraser, or moles that have changed in shape or color.

## 2017-05-27 NOTE — Progress Notes (Signed)
Pre visit review using our clinic review tool, if applicable. No additional management support is needed unless otherwise documented below in the visit note. 

## 2017-05-27 NOTE — Progress Notes (Signed)
Subjective:   Chase Moreno is a 66 y.o. male who presents for Medicare Annual/Subsequent preventive examination.  Review of Systems:  N/A Cardiac Risk Factors include: advanced age (>56men, >57 women);obesity (BMI >30kg/m2);male gender;diabetes mellitus;dyslipidemia;hypertension     Objective:    Vitals: BP 120/84 (BP Location: Right Arm, Patient Position: Sitting, Cuff Size: Normal)   Pulse 63   Temp 98 F (36.7 C) (Oral)   Ht 5\' 6"  (1.676 m) Comment: no shoes  Wt 202 lb (91.6 kg)   SpO2 97%   BMI 32.60 kg/m   Body mass index is 32.6 kg/m.  Tobacco Social History   Tobacco Use  Smoking Status Former Smoker  Smokeless Tobacco Never Used  Tobacco Comment   quit over 40 years ago     Counseling given: No Comment: quit over 40 years ago   Past Medical History:  Diagnosis Date  . Allergic rhinitis, cause unspecified   . Cancer [facility])    prostate cancer 2018  . Diabetes mellitus without complication [facility])    patient denies although dx in 2017 with a Dr Diona Browner ; last hgA1c was however 5.5   . Unspecified essential hypertension   . Unspecified sleep apnea    CPAP machine use    Past Surgical History:  Procedure Laterality Date  . FRACTURE SURGERY     left arm   . TONSILLECTOMY     Family History  Problem Relation Age of Onset  . Arthritis Mother   . Arrhythmia Mother   . Stroke Paternal Grandmother    Social History   Substance and Sexual Activity  Sexual Activity Not on file    Outpatient Encounter Medications as of 05/27/2017  Medication Sig  . losartan-hydrochlorothiazide (HYZAAR) 50-12.5 MG tablet TAKE ONE TABLET BY MOUTH ONCE DAILY  . [DISCONTINUED] HYDROcodone-acetaminophen (NORCO) 5-325 MG tablet Take 1-2 tablets by mouth every 6 (six) hours as needed for moderate pain or severe pain.  . [DISCONTINUED] loratadine (CLARITIN) 10 MG tablet Take 10 mg by mouth daily.  . [DISCONTINUED] sulfamethoxazole-trimethoprim (BACTRIM DS,SEPTRA DS) 800-160 MG  tablet Take 1 tablet by mouth 2 (two) times daily. Start the day prior to foley removal appointment   No facility-administered encounter medications on file as of 05/27/2017.     Activities of Daily Living In your present state of health, do you have any difficulty performing the following activities: 05/27/2017 02/17/2017  Hearing? N N  Vision? N Y  Difficulty concentrating or making decisions? N N  Walking or climbing stairs? N N  Dressing or bathing? N N  Doing errands, shopping? N N  Preparing Food and eating ? N -  Using the Toilet? N -  In the past six months, have you accidently leaked urine? N -  Do you have problems with loss of bowel control? N -  Managing your Medications? N -  Managing your Finances? N -  Housekeeping or managing your Housekeeping? N -  Some recent data might be hidden    Patient Care Team: Jinny Sanders, MD as PCP - General Raynelle Bring, MD as Consulting Physician (Urology) Festus Aloe, MD as Consulting Physician (Urology) Parrett, Fonnie Mu, NP as Nurse Practitioner (Pulmonary Disease)   Assessment:     Hearing Screening   125Hz  250Hz  500Hz  1000Hz  2000Hz  3000Hz  4000Hz  6000Hz  8000Hz   Right ear:   40 40 40  40    Left ear:   40 40 40  0      Visual Acuity Screening  Right eye Left eye Both eyes  Without correction: 20/20 20/20 20/13-1  With correction:       Exercise Activities and Dietary recommendations Current Exercise Habits: Home exercise routine, Type of exercise: walking;Other - see comments(hunting), Time (Minutes): 60, Frequency (Times/Week): 4, Weekly Exercise (Minutes/Week): 240, Intensity: Moderate, Exercise limited by: None identified  Goals    Starting 05/27/2017, I will continue to walk or to hunt for at least 60 min 3-4 days per week.      Fall Risk Fall Risk  05/27/2017 11/28/2015  Falls in the past year? No No   Depression Screen PHQ 2/9 Scores 05/27/2017 11/28/2015  PHQ - 2 Score 0 0  PHQ- 9 Score 0 -     Cognitive Function MMSE - Mini Mental State Exam 05/27/2017  Orientation to time 5  Orientation to Place 5  Registration 3  Attention/ Calculation 0  Recall 3  Language- name 2 objects 0  Language- repeat 1  Language- follow 3 step command 3  Language- read & follow direction 0  Write a sentence 0  Copy design 0  Total score 20     PLEASE NOTE: A Mini-Cog screen was completed. Maximum score is 20. A value of 0 denotes this part of Folstein MMSE was not completed or the patient failed this part of the Mini-Cog screening.   Mini-Cog Screening Orientation to Time - Max 5 pts Orientation to Place - Max 5 pts Registration - Max 3 pts Recall - Max 3 pts Language Repeat - Max 1 pts Language Follow 3 Step Command - Max 3 pts     Immunization History  Administered Date(s) Administered  . Influenza Nasal 04/14/2013  . Influenza,inj,Quad PF,6+ Mos 09/12/2015  . Influenza,trivalent, recombinat, inj, PF 05/04/2017  . Influenza-Unspecified 04/18/2014  . Pneumococcal Conjugate-13 09/12/2015  . Pneumococcal Polysaccharide-23 05/27/2017  . Td 08/24/2007  . Zoster 11/09/2014   Screening Tests Health Maintenance  Topic Date Due  . FOOT EXAM  05/29/2017 (Originally 11/27/2016)  . OPHTHALMOLOGY EXAM  05/27/2018 (Originally 02/16/1961)  . TETANUS/TDAP  08/23/2017  . COLONOSCOPY  10/08/2017  . HEMOGLOBIN A1C  11/24/2017  . INFLUENZA VACCINE  Completed  . Hepatitis C Screening  Completed  . PNA vac Low Risk Adult  Completed      Plan:     I have personally reviewed, addressed, and noted the following in the patient's chart:  A. Medical and social history B. Use of alcohol, tobacco or illicit drugs  C. Current medications and supplements D. Functional ability and status E.  Nutritional status F.  Physical activity G. Advance directives H. List of other physicians I.  Hospitalizations, surgeries, and ER visits in previous 12 months J.  [place] to include hearing,  vision, cognitive, depression L. Referrals and appointments - none  In addition, I have reviewed and discussed with patient certain preventive protocols, quality metrics, and best practice recommendations. A written personalized care plan for preventive services as well as general preventive health recommendations were provided to patient.  See attached scanned questionnaire for additional information.   Signed,   Lindell Noe, MHA, BS, LPN Health Coach

## 2017-05-27 NOTE — Progress Notes (Signed)
PCP notes:   Health maintenance:  Foot exam - PCP please address at next appt Eye exam - postponed/insurance (no coverage) A1C - completed PPSV23 - administered  Abnormal screenings:   Hearing - failed  Hearing Screening   125Hz  250Hz  500Hz  1000Hz  2000Hz  3000Hz  4000Hz  6000Hz  8000Hz   Right ear:   40 40 40  40    Left ear:   40 40 40  0     Patient concerns:   Pt has requested referral to ophthalmologist.   Nurse concerns:  None  Next PCP appt:   05/29/17 @ 1115

## 2017-05-27 NOTE — Progress Notes (Signed)
I reviewed health advisor's note, was available for consultation, and agree with documentation and plan.  

## 2017-05-29 ENCOUNTER — Other Ambulatory Visit: Payer: Self-pay

## 2017-05-29 ENCOUNTER — Encounter: Payer: Self-pay | Admitting: Family Medicine

## 2017-05-29 ENCOUNTER — Ambulatory Visit (INDEPENDENT_AMBULATORY_CARE_PROVIDER_SITE_OTHER): Payer: PPO | Admitting: Family Medicine

## 2017-05-29 VITALS — BP 128/80 | HR 74 | Temp 97.7°F | Ht 66.0 in | Wt 206.0 lb

## 2017-05-29 DIAGNOSIS — E119 Type 2 diabetes mellitus without complications: Secondary | ICD-10-CM

## 2017-05-29 DIAGNOSIS — C61 Malignant neoplasm of prostate: Secondary | ICD-10-CM | POA: Diagnosis not present

## 2017-05-29 DIAGNOSIS — I10 Essential (primary) hypertension: Secondary | ICD-10-CM | POA: Diagnosis not present

## 2017-05-29 DIAGNOSIS — Z0000 Encounter for general adult medical examination without abnormal findings: Secondary | ICD-10-CM

## 2017-05-29 DIAGNOSIS — E7800 Pure hypercholesterolemia, unspecified: Secondary | ICD-10-CM

## 2017-05-29 NOTE — Assessment & Plan Note (Signed)
Improved control in prediabtes range with lifestyle changes.

## 2017-05-29 NOTE — Patient Instructions (Addendum)
Decrease ham, bacon, sausage, ice cream, eggs, cheese.  Start red yeast rice 600 mg 2 capsule twice a day.  Schedule follow up cholesterol lab only in 3 months. Please stop at the front desk to set up referral.

## 2017-05-29 NOTE — Assessment & Plan Note (Signed)
S/P prostatectomy

## 2017-05-29 NOTE — Assessment & Plan Note (Signed)
Well controlled. Continue current medication.  

## 2017-05-29 NOTE — Progress Notes (Signed)
Subjective:    Patient ID: Chase Moreno, male    DOB: 1951-02-10, 66 y.o.   MRN: [id number]  HPI  The patient presents for  complete physical and review of chronic health problems.  The patient saw Candis Musa, LPN for medicare wellness. Note reviewed in detail and important notes copied below.  Foot exam - PCP please address at next appt Eye exam - postponed/insurance (no coverage) A1C - completed PPSV23 - administered Abnormal screenings:  Hearing - failed             Hearing Screening   125Hz  250Hz  500Hz  1000Hz  2000Hz  3000Hz  4000Hz  6000Hz  8000Hz   Right ear:   40 40 40  40    Left ear:   40 40 40  0     Patient concerns:  Pt has requested referral to ophthalmologist.    05/29/17 today:  Hypertension: Well controlled on HCTZ  BP Readings from Last 3 Encounters:  05/29/17 128/80  05/27/17 120/84  05/04/17 125/80  sing medication without problems or lightheadedness: None  Chest pain with exertion:None  Edema:None  Short of breath:None  Average home BPs:Not checking  Other issues:  Body mass index is 33.25 kg/m.   Wt Readings from Last 3 Encounters:  05/29/17 206 lb (93.4 kg)  05/27/17 202 lb (91.6 kg)  02/17/17 208 lb 15.9 oz (94.8 kg)     High cholesterol, LDL not at goal < 100 on no medication. Lab Results  Component Value Date   CHOL 205 (H) 05/27/2017   HDL 42.00 05/27/2017   LDLCALC 147 (H) 05/27/2017   LDLDIRECT 169.8 02/24/2012   TRIG 82.0 05/27/2017   CHOLHDL 5 05/27/2017  Diet compliance: Moderate  Exercise:walk 1-2 times a week  Other complaints:   Diabetes:  Well controlled on no medication. Lab Results  Component Value Date   HGBA1C 5.7 05/27/2017  Improving diet Using medications without difficulties: Hypoglycemic episodes: Hyperglycemic episodes: Feet problems: none Blood Sugars averaging: not checking eye exam within last year: due    Social History /Family History/Past Medical History reviewed  in detail and updated in EMR if needed. Blood pressure 128/80, pulse 74, temperature 97.7 F (36.5 C), temperature source Oral, height 5\' 6"  (1.676 m), weight 206 lb (93.4 kg).    Review of Systems  Constitutional: Negative for fatigue and fever.  HENT: Negative for ear pain.   Eyes: Negative for pain.  Respiratory: Negative for cough and shortness of breath.   Cardiovascular: Negative for chest pain, palpitations and leg swelling.  Gastrointestinal: Negative for abdominal pain.  Genitourinary: Negative for dysuria.  Musculoskeletal: Negative for arthralgias.  Neurological: Negative for syncope, light-headedness and headaches.  Psychiatric/Behavioral: Negative for dysphoric mood.       Objective:   Physical Exam  Constitutional: He appears well-developed and well-nourished.  Non-toxic appearance. He does not appear ill. No distress.  Central obesity  HENT:  Head: Normocephalic and atraumatic.  Right Ear: Hearing, tympanic membrane, external ear and ear canal normal.  Left Ear: Hearing, tympanic membrane, external ear and ear canal normal.  Nose: Nose normal.  Mouth/Throat: Uvula is midline, oropharynx is clear and moist and mucous membranes are normal.  Eyes: Conjunctivae, EOM and lids are normal. Pupils are equal, round, and reactive to light. Lids are everted and swept, no foreign bodies found.  Neck: Trachea normal, normal range of motion and phonation normal. Neck supple. Carotid bruit is not present. No thyroid mass and no thyromegaly present.  Cardiovascular: Normal rate, regular rhythm, S1 normal,  S2 normal, intact distal pulses and normal pulses. Exam reveals no gallop.  No murmur heard. Pulmonary/Chest: Breath sounds normal. He has no wheezes. He has no rhonchi. He has no rales.  Abdominal: Soft. Normal appearance and bowel sounds are normal. There is no hepatosplenomegaly. There is no tenderness. There is no rebound, no guarding and no CVA tenderness. No hernia.    Lymphadenopathy:    He has no cervical adenopathy.  Neurological: He is alert. He has normal strength and normal reflexes. No cranial nerve deficit or sensory deficit. Gait normal.  Skin: Skin is warm, dry and intact. No rash noted.  Psychiatric: He has a normal mood and affect. His speech is normal and behavior is normal. Judgment normal.      Diabetic foot exam: Normal inspection No skin breakdown No calluses  Normal DP pulses Normal sensation to light touch and monofilament Nails normal     Assessment & Plan:  The patient's preventative maintenance and recommended screening tests for an annual wellness exam were reviewed in full today. Brought up to date unless services declined.  Counselled on the importance of diet, exercise, and its role in overall health and mortality. The patient's FH and SH was reviewed, including their home life, tobacco status, and drug and alcohol status.   Vaccines: uptodate with Td, shingles vaccine  Colon: 10/09/2007 Dr. Fuller Plan, polyps, Repeat due in 10 years.  Hep C:neg nonsmoker Prostate:  02/2017 S/p  Robotic assisted prostatectomy Dr. Alinda Money.

## 2017-05-29 NOTE — Assessment & Plan Note (Signed)
Still has very high fat and chol in diet..refuses statin.. Start red yeast rice and low chol diet reviewed in detail. Pt to return in 3 months for labs re-eval.

## 2017-06-02 DIAGNOSIS — M6281 Muscle weakness (generalized): Secondary | ICD-10-CM | POA: Diagnosis not present

## 2017-06-02 DIAGNOSIS — M62838 Other muscle spasm: Secondary | ICD-10-CM | POA: Diagnosis not present

## 2017-06-02 DIAGNOSIS — N393 Stress incontinence (female) (male): Secondary | ICD-10-CM | POA: Diagnosis not present

## 2017-06-20 DIAGNOSIS — H25813 Combined forms of age-related cataract, bilateral: Secondary | ICD-10-CM | POA: Diagnosis not present

## 2017-06-20 DIAGNOSIS — E119 Type 2 diabetes mellitus without complications: Secondary | ICD-10-CM | POA: Diagnosis not present

## 2017-06-20 LAB — HM DIABETES EYE EXAM

## 2017-07-01 ENCOUNTER — Encounter: Payer: Self-pay | Admitting: Family Medicine

## 2017-07-03 DIAGNOSIS — M6281 Muscle weakness (generalized): Secondary | ICD-10-CM | POA: Diagnosis not present

## 2017-07-03 DIAGNOSIS — M62838 Other muscle spasm: Secondary | ICD-10-CM | POA: Diagnosis not present

## 2017-07-03 DIAGNOSIS — N393 Stress incontinence (female) (male): Secondary | ICD-10-CM | POA: Diagnosis not present

## 2017-07-25 ENCOUNTER — Other Ambulatory Visit: Payer: Self-pay | Admitting: Family Medicine

## 2017-07-29 ENCOUNTER — Telehealth: Payer: Self-pay | Admitting: *Deleted

## 2017-07-29 MED ORDER — LOSARTAN POTASSIUM 50 MG PO TABS: 50.0000 mg | ORAL_TABLET | Freq: Every day | ORAL | 1 refills | Status: DC

## 2017-07-29 MED ORDER — HYDROCHLOROTHIAZIDE 12.5 MG PO CAPS: 12.5000 mg | ORAL_CAPSULE | Freq: Every day | ORAL | 1 refills | Status: DC

## 2017-07-29 NOTE — Telephone Encounter (Signed)
Rx for Losartan 50 mg and HCTZ 12.5 mg sent to [facility] in [place].

## 2017-07-29 NOTE — Telephone Encounter (Signed)
Received fax from [facility] stating Losartan-HCT 50-12.5 mg is on back order and they are asking if they can split medication.  Please advise.

## 2017-07-29 NOTE — Telephone Encounter (Signed)
Yes, okay to split medication into parts.

## 2017-08-29 ENCOUNTER — Other Ambulatory Visit (INDEPENDENT_AMBULATORY_CARE_PROVIDER_SITE_OTHER): Payer: PPO

## 2017-08-29 ENCOUNTER — Telehealth: Payer: Self-pay | Admitting: Family Medicine

## 2017-08-29 DIAGNOSIS — E7800 Pure hypercholesterolemia, unspecified: Secondary | ICD-10-CM | POA: Diagnosis not present

## 2017-08-29 DIAGNOSIS — E119 Type 2 diabetes mellitus without complications: Secondary | ICD-10-CM | POA: Diagnosis not present

## 2017-08-29 DIAGNOSIS — R972 Elevated prostate specific antigen [PSA]: Secondary | ICD-10-CM

## 2017-08-29 LAB — COMPREHENSIVE METABOLIC PANEL
ALT: 15 U/L (ref 0–53)
AST: 20 U/L (ref 0–37)
Albumin: 4.2 g/dL (ref 3.5–5.2)
Alkaline Phosphatase: 58 U/L (ref 39–117)
BUN: 15 mg/dL (ref 6–23)
CALCIUM: 9.2 mg/dL (ref 8.4–10.5)
CHLORIDE: 106 meq/L (ref 96–112)
CO2: 28 meq/L (ref 19–32)
CREATININE: 1.23 mg/dL (ref 0.40–1.50)
GFR: 75.36 mL/min (ref >60.00)
Glucose, Bld: 97 mg/dL (ref 70–99)
Potassium: 4.3 mEq/L (ref 3.5–5.1)
SODIUM: 140 meq/L (ref 135–145)
Total Bilirubin: 0.6 mg/dL (ref 0.2–1.2)
Total Protein: 7.8 g/dL (ref 6.0–8.3)

## 2017-08-29 LAB — LIPID PANEL
CHOL/HDL RATIO: 4
Cholesterol: 194 mg/dL (ref 0–200)
HDL: 43.1 mg/dL (ref >39.00)
LDL CALC: 139 mg/dL — AB (ref 0–99)
NonHDL: 150.6
TRIGLYCERIDES: 60 mg/dL (ref 0.0–149.0)
VLDL: 12 mg/dL (ref 0.0–40.0)

## 2017-08-29 LAB — HEMOGLOBIN A1C: Hgb A1c MFr Bld: 6.1 % (ref 4.6–6.5)

## 2017-08-29 NOTE — Telephone Encounter (Signed)
-----   Message from Ellamae Sia sent at 08/21/2017  5:05 PM EST ----- Regarding: Lab orders for Friday, 2.15.19 Lab orders for a 3 month follow up appt.

## 2017-11-19 DIAGNOSIS — C61 Malignant neoplasm of prostate: Secondary | ICD-10-CM | POA: Diagnosis not present

## 2017-12-11 DIAGNOSIS — G4733 Obstructive sleep apnea (adult) (pediatric): Secondary | ICD-10-CM | POA: Diagnosis not present

## 2018-01-23 DIAGNOSIS — C61 Malignant neoplasm of prostate: Secondary | ICD-10-CM | POA: Diagnosis not present

## 2018-01-23 DIAGNOSIS — N393 Stress incontinence (female) (male): Secondary | ICD-10-CM | POA: Diagnosis not present

## 2018-01-23 DIAGNOSIS — N5201 Erectile dysfunction due to arterial insufficiency: Secondary | ICD-10-CM | POA: Diagnosis not present

## 2018-02-25 ENCOUNTER — Other Ambulatory Visit: Payer: Self-pay | Admitting: Family Medicine

## 2018-05-28 ENCOUNTER — Telehealth: Payer: Self-pay | Admitting: Family Medicine

## 2018-05-28 DIAGNOSIS — E119 Type 2 diabetes mellitus without complications: Secondary | ICD-10-CM

## 2018-05-28 DIAGNOSIS — E7800 Pure hypercholesterolemia, unspecified: Secondary | ICD-10-CM

## 2018-05-28 DIAGNOSIS — R972 Elevated prostate specific antigen [PSA]: Secondary | ICD-10-CM

## 2018-05-28 NOTE — Telephone Encounter (Signed)
-----   Message from Eustace Pen, LPN sent at [phone]  1:47 PM EST ----- Regarding: Labs 11/15 Lab orders needed. Thank you.  Insurance:  [employer]  If none, please advise. Thank you.

## 2018-05-28 NOTE — Telephone Encounter (Signed)
Left message asking pt to call office Please r/s 11/19 appointment with dr Diona Browner

## 2018-05-29 ENCOUNTER — Ambulatory Visit: Payer: PPO

## 2018-05-29 ENCOUNTER — Ambulatory Visit (INDEPENDENT_AMBULATORY_CARE_PROVIDER_SITE_OTHER): Payer: PPO

## 2018-05-29 VITALS — BP 140/100 | HR 70 | Temp 97.6°F | Ht 67.0 in | Wt 199.5 lb

## 2018-05-29 DIAGNOSIS — E119 Type 2 diabetes mellitus without complications: Secondary | ICD-10-CM

## 2018-05-29 DIAGNOSIS — Z0000 Encounter for general adult medical examination without abnormal findings: Secondary | ICD-10-CM

## 2018-05-29 LAB — COMPREHENSIVE METABOLIC PANEL
ALBUMIN: 4.3 g/dL (ref 3.5–5.2)
ALK PHOS: 61 U/L (ref 39–117)
ALT: 16 U/L (ref 0–53)
AST: 24 U/L (ref 0–37)
BILIRUBIN TOTAL: 0.6 mg/dL (ref 0.2–1.2)
BUN: 13 mg/dL (ref 6–23)
CO2: 29 mEq/L (ref 19–32)
CREATININE: 1.23 mg/dL (ref 0.40–1.50)
Calcium: 9.3 mg/dL (ref 8.4–10.5)
Chloride: 107 mEq/L (ref 96–112)
GFR: 75.19 mL/min (ref >60.00)
GLUCOSE: 98 mg/dL (ref 70–99)
POTASSIUM: 4 meq/L (ref 3.5–5.1)
SODIUM: 141 meq/L (ref 135–145)
TOTAL PROTEIN: 7.9 g/dL (ref 6.0–8.3)

## 2018-05-29 LAB — LIPID PANEL
CHOLESTEROL: 191 mg/dL (ref 0–200)
HDL: 44.6 mg/dL (ref >39.00)
LDL Cholesterol: 132 mg/dL — ABNORMAL HIGH (ref 0–99)
NONHDL: 146.13
Total CHOL/HDL Ratio: 4
Triglycerides: 73 mg/dL (ref 0.0–149.0)
VLDL: 14.6 mg/dL (ref 0.0–40.0)

## 2018-05-29 LAB — HEMOGLOBIN A1C: Hgb A1c MFr Bld: 5.9 % (ref 4.6–6.5)

## 2018-05-29 NOTE — Patient Instructions (Addendum)
Chase Moreno , Thank you for taking time to come for your Medicare Wellness Visit. I appreciate your ongoing commitment to your health goals. Please review the following plan we discussed and let me know if I can assist you in the future.   These are the goals we discussed: Goals    . Increase physical activity     Starting 05/29/2018, I will continue to walk or to hunt for at least 60 min 3-4 days per week.        This is a list of the screening recommended for you and due dates:  Health Maintenance  Topic Date Due  . Complete foot exam   06/02/2018*  . Colon Cancer Screening  07/15/2019*  . Tetanus Vaccine  07/15/2019*  . Eye exam for diabetics  06/20/2018  . Hemoglobin A1C  11/27/2018  . Flu Shot  Completed  .  Hepatitis C: One time screening is recommended by Center for Disease Control  (CDC) for  adults born from [birth year] through 1966.   Completed  . Pneumonia vaccines  Completed  *Topic was postponed. The date shown is not the original due date.   Preventive Care for Adults  A healthy lifestyle and preventive care can promote health and wellness. Preventive health guidelines for adults include the following key practices.  . A routine yearly physical is a good way to check with your health care provider about your health and preventive screening. It is a chance to share any concerns and updates on your health and to receive a thorough exam.  . Visit your dentist for a routine exam and preventive care every 6 months. Brush your teeth twice a day and floss once a day. Good oral hygiene prevents tooth decay and gum disease.  . The frequency of eye exams is based on your age, health, family medical history, use  of contact lenses, and other factors. Follow your health care provider's recommendations for frequency of eye exams.  . Eat a healthy diet. Foods like vegetables, fruits, whole grains, low-fat dairy products, and lean protein foods contain the nutrients you need without too  many calories. Decrease your intake of foods high in solid fats, added sugars, and salt. Eat the right amount of calories for you. Get information about a proper diet from your health care provider, if necessary.  . Regular physical exercise is one of the most important things you can do for your health. Most adults should get at least 150 minutes of moderate-intensity exercise (any activity that increases your heart rate and causes you to sweat) each week. In addition, most adults need muscle-strengthening exercises on 2 or more days a week.  Silver Sneakers may be a benefit available to you. To determine eligibility, you may visit the website: www.silversneakers.com or contact program at 210-449-6855 Mon-Fri between 8AM-8PM.   . Maintain a healthy weight. The body mass index (BMI) is a screening tool to identify possible weight problems. It provides an estimate of body fat based on height and weight. Your health care provider can find your BMI and can help you achieve or maintain a healthy weight.   For adults 20 years and older: ? A BMI below 18.5 is considered underweight. ? A BMI of 18.5 to 24.9 is normal. ? A BMI of 25 to 29.9 is considered overweight. ? A BMI of 30 and above is considered obese.   . Maintain normal blood lipids and cholesterol levels by exercising and minimizing your intake of saturated fat. Eat  a balanced diet with plenty of fruit and vegetables. Blood tests for lipids and cholesterol should begin at age 48 and be repeated every 5 years. If your lipid or cholesterol levels are high, you are over 50, or you are at high risk for heart disease, you may need your cholesterol levels checked more frequently. Ongoing high lipid and cholesterol levels should be treated with medicines if diet and exercise are not working.  . If you smoke, find out from your health care provider how to quit. If you do not use tobacco, please do not start.  . If you choose to drink alcohol, please  do not consume more than 2 drinks per day. One drink is considered to be 12 ounces (355 mL) of beer, 5 ounces (148 mL) of wine, or 1.5 ounces (44 mL) of liquor.  . If you are 54-75 years old, ask your health care provider if you should take aspirin to prevent strokes.  . Use sunscreen. Apply sunscreen liberally and repeatedly throughout the day. You should seek shade when your shadow is shorter than you. Protect yourself by wearing long sleeves, pants, a wide-brimmed hat, and sunglasses year round, whenever you are outdoors.  . Once a month, do a whole body skin exam, using a mirror to look at the skin on your back. Tell your health care provider of new moles, moles that have irregular borders, moles that are larger than a pencil eraser, or moles that have changed in shape or color.

## 2018-05-29 NOTE — Progress Notes (Signed)
PCP notes:   Health maintenance:  Foot exam - PCP follow-up requested Microalbumin - postponed/PCP follow-up requested Colon cancer screening - PCP follow-up requested Tetanus vaccine - postponed/insurance  Abnormal screenings:   Hearing - failed  Hearing Screening   125Hz  250Hz  500Hz  1000Hz  2000Hz  3000Hz  4000Hz  6000Hz  8000Hz   Right ear:   40 0 40  0    Left ear:   40 40 40  0     Patient concerns:   None  Nurse concerns:  BP elevated @ 140/100. Patient states he has not taken medication as this time. Patient is alert and oriented.   Next PCP appt:   06/02/18 @ 0730

## 2018-05-29 NOTE — Progress Notes (Signed)
Subjective:   Chase Moreno is a 67 y.o. male who presents for Medicare Annual/Subsequent preventive examination.  Review of Systems:  N/A Cardiac Risk Factors include: advanced age (>83men, >89 women);hypertension;male gender;dyslipidemia;diabetes mellitus     Objective:    Vitals: BP (!) 140/100 (BP Location: Right Arm, Patient Position: Sitting, Cuff Size: Large) Comment: BP medication not taken  Pulse 70   Temp 97.6 F (36.4 C) (Oral)   Ht 5\' 7"  (1.702 m) Comment: shoes  Wt 199 lb 8 oz (90.5 kg)   SpO2 99%   BMI 31.25 kg/m   Body mass index is 31.25 kg/m.  Advanced Directives 05/29/2018 05/27/2017 02/17/2017 02/12/2017 11/28/2015 01/30/2014  Does Patient Have a Medical Advance Directive? Yes No No No Yes Patient does not have advance directive;Patient would not like information  Type of Advance Directive Living will - - - [place];Living will -  Does patient want to make changes to medical advance directive? - - - - No - Patient declined -  Copy of [place] in Chart? - - - - No - copy requested -  Would patient like information on creating a medical advance directive? - Yes (MAU/Ambulatory/Procedural Areas - Information given) No - Patient declined No - Patient declined - -    Tobacco Social History   Tobacco Use  Smoking Status Former Smoker  Smokeless Tobacco Never Used  Tobacco Comment   quit over 40 years ago     Counseling given: No Comment: quit over 40 years ago   Clinical Intake:     Pain : No/denies pain Pain Score: 0-No pain     Nutritional Status: BMI > 30  Obese Nutritional Risks: None Diabetes: Yes CBG done?: No Did pt. bring in CBG monitor from home?: No  How often do you need to have someone help you when you read instructions, pamphlets, or other written materials from your doctor or pharmacy?: 1 - Never What is the last grade level you completed in school?: 12th grade  Interpreter Needed?:  No  Comments: pt lives with spouse Information entered by :: LPinson, LPN  Past Medical History:  Diagnosis Date  . Allergic rhinitis, cause unspecified   . Cancer [facility])    prostate cancer 2018  . Diabetes mellitus without complication [facility])    patient denies although dx in 2017 with a Dr Diona Browner ; last hgA1c was however 5.5   . Unspecified essential hypertension   . Unspecified sleep apnea    CPAP machine use    Past Surgical History:  Procedure Laterality Date  . FRACTURE SURGERY     left arm   . LYMPHADENECTOMY Bilateral 02/17/2017   Procedure: LYMPHADENECTOMY;  Surgeon: Raynelle Bring, MD;  Location: [facility];  Service: Urology;  Laterality: Bilateral;  . ROBOT ASSISTED LAPAROSCOPIC RADICAL PROSTATECTOMY N/A 02/17/2017   Procedure: XI ROBOTIC ASSISTED LAPAROSCOPIC RADICAL PROSTATECTOMY LEVEL 2;  Surgeon: Raynelle Bring, MD;  Location: [facility];  Service: Urology;  Laterality: N/A;  . TONSILLECTOMY     Family History  Problem Relation Age of Onset  . Arthritis Mother   . Arrhythmia Mother   . Stroke Paternal Grandmother    Social History   Socioeconomic History  . Marital status: Married    Spouse name: Not on file  . Number of children: 2  . Years of education: Not on file  . Highest education level: Not on file  Occupational History  . 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 Financial resource strain: Not on file  . Food insecurity:    Worry: Not on file    Inability: Not on file  . Transportation needs:    Medical: Not on file    Non-medical: Not on file  Tobacco Use  . Smoking status: Former Research scientist (life sciences)  . Smokeless tobacco: Never Used  . Tobacco comment: quit over 40 years ago  Substance and Sexual Activity  . Alcohol use: No    Alcohol/week: 0.0 standard drinks  . Drug use: No  . Sexual activity: Not on file  Lifestyle  . Physical activity:    Days per week: Not on file    Minutes per session: Not on file  . Stress: Not  on file  Relationships  . Social connections:    Talks on phone: Not on file    Gets together: Not on file    Attends religious service: Not on file    Active member of club or organization: Not on file    Attends meetings of clubs or organizations: Not on file    Relationship status: Not on file  Other Topics Concern  . Not on file  Social History Narrative   Regular exercise---yes, rabbit hunting, walking 2 times  A week.      Diet---eating fruit and veggies, limiting meat.      Widowed: Remarried.          Outpatient Encounter Medications as of 05/29/2018  Medication Sig  . hydrochlorothiazide (MICROZIDE) 12.5 MG capsule Take 1 capsule (12.5 mg total) by mouth daily.  . [DISCONTINUED] losartan (COZAAR) 50 MG tablet TAKE 1 TABLET BY MOUTH ONCE DAILY   No facility-administered encounter medications on file as of 05/29/2018.     Activities of Daily Living In your present state of health, do you have any difficulty performing the following activities: 05/29/2018  Hearing? N  Vision? N  Difficulty concentrating or making decisions? N  Walking or climbing stairs? N  Dressing or bathing? N  Doing errands, shopping? N  Preparing Food and eating ? N  Using the Toilet? N  In the past six months, have you accidently leaked urine? N  Do you have problems with loss of bowel control? N  Managing your Medications? N  Managing your Finances? N  Housekeeping or managing your Housekeeping? N  Some recent data might be hidden    Patient Care Team: Jinny Sanders, MD as PCP - General Raynelle Bring, MD as Consulting Physician (Urology) Festus Aloe, MD as Consulting Physician (Urology) Parrett, Fonnie Mu, NP as Nurse Practitioner (Pulmonary Disease)   Assessment:   This is a routine wellness examination for [name].   Hearing Screening   125Hz  250Hz  500Hz  1000Hz  2000Hz  3000Hz  4000Hz  6000Hz  8000Hz   Right ear:   40 0 40  0    Left ear:   40 40 40  0    Vision Screening  Comments: Vision exam in December 2018   Exercise Activities and Dietary recommendations Current Exercise Habits: Home exercise routine, Type of exercise: walking, Time (Minutes): > 60, Frequency (Times/Week): 4, Weekly Exercise (Minutes/Week): 0, Intensity: Mild, Exercise limited by: None identified  Goals    . Increase physical activity     Starting 05/29/2018, I will continue to walk or to hunt for at least 60 min 3-4 days per week.        Fall Risk Fall Risk  05/29/2018 05/27/2017 11/28/2015  Falls in the past year? 0 No  No   Depression Screen PHQ 2/9 Scores 05/29/2018 05/27/2017 11/28/2015  PHQ - 2 Score 0 0 0  PHQ- 9 Score 0 0 -    Cognitive Function MMSE - Mini Mental State Exam 05/29/2018 05/27/2017  Orientation to time 5 5  Orientation to Place 5 5  Registration 3 3  Attention/ Calculation 0 0  Recall 3 3  Language- name 2 objects 0 0  Language- repeat 1 1  Language- follow 3 step command 3 3  Language- read & follow direction 0 0  Write a sentence 0 0  Copy design 0 0  Total score 20 20     PLEASE NOTE: A Mini-Cog screen was completed. Maximum score is 20. A value of 0 denotes this part of Folstein MMSE was not completed or the patient failed this part of the Mini-Cog screening.   Mini-Cog Screening Orientation to Time - Max 5 pts Orientation to Place - Max 5 pts Registration - Max 3 pts Recall - Max 3 pts Language Repeat - Max 1 pts Language Follow 3 Step Command - Max 3 pts     Immunization History  Administered Date(s) Administered  . Influenza Nasal 04/14/2013  . Influenza,inj,Quad PF,6+ Mos 09/12/2015, 04/13/2018  . Influenza,trivalent, recombinat, inj, PF 05/04/2017  . Influenza-Unspecified 04/18/2014  . Pneumococcal Conjugate-13 09/12/2015  . Pneumococcal Polysaccharide-23 05/27/2017  . Td 08/24/2007  . Zoster 11/09/2014   Screening Tests Health Maintenance  Topic Date Due  . FOOT EXAM  06/02/2018 (Originally 11/27/2016)  . COLONOSCOPY   07/15/2019 (Originally 10/08/2017)  . TETANUS/TDAP  07/15/2019 (Originally 08/23/2017)  . OPHTHALMOLOGY EXAM  06/20/2018  . HEMOGLOBIN A1C  11/27/2018  . INFLUENZA VACCINE  Completed  . Hepatitis C Screening  Completed  . PNA vac Low Risk Adult  Completed      Plan:     I have personally reviewed, addressed, and noted the following in the patient's chart:  A. Medical and social history B. Use of alcohol, tobacco or illicit drugs  C. Current medications and supplements D. Functional ability and status E.  Nutritional status F.  Physical activity G. Advance directives H. List of other physicians I.  Hospitalizations, surgeries, and ER visits in previous 12 months J.  [place] to include hearing, vision, cognitive, depression L. Referrals and appointments - none  In addition, I have reviewed and discussed with patient certain preventive protocols, quality metrics, and best practice recommendations. A written personalized care plan for preventive services as well as general preventive health recommendations were provided to patient.  See attached scanned questionnaire for additional information.   Signed,   Lindell Noe, MHA, BS, LPN Health Coach

## 2018-06-01 NOTE — Progress Notes (Signed)
I reviewed health advisor's note, was available for consultation, and agree with documentation and plan.  

## 2018-06-02 ENCOUNTER — Encounter: Payer: PPO | Admitting: Family Medicine

## 2018-06-15 ENCOUNTER — Other Ambulatory Visit: Payer: Self-pay | Admitting: Family Medicine

## 2018-06-26 ENCOUNTER — Encounter: Payer: Self-pay | Admitting: Family Medicine

## 2018-06-26 ENCOUNTER — Ambulatory Visit (INDEPENDENT_AMBULATORY_CARE_PROVIDER_SITE_OTHER): Payer: PPO | Admitting: Family Medicine

## 2018-06-26 VITALS — BP 130/74 | HR 76 | Temp 97.5°F | Ht 67.0 in | Wt 203.2 lb

## 2018-06-26 DIAGNOSIS — Z0000 Encounter for general adult medical examination without abnormal findings: Secondary | ICD-10-CM

## 2018-06-26 DIAGNOSIS — E119 Type 2 diabetes mellitus without complications: Secondary | ICD-10-CM

## 2018-06-26 DIAGNOSIS — I10 Essential (primary) hypertension: Secondary | ICD-10-CM | POA: Diagnosis not present

## 2018-06-26 DIAGNOSIS — H9193 Unspecified hearing loss, bilateral: Secondary | ICD-10-CM | POA: Diagnosis not present

## 2018-06-26 DIAGNOSIS — C61 Malignant neoplasm of prostate: Secondary | ICD-10-CM | POA: Diagnosis not present

## 2018-06-26 DIAGNOSIS — E7800 Pure hypercholesterolemia, unspecified: Secondary | ICD-10-CM | POA: Diagnosis not present

## 2018-06-26 DIAGNOSIS — Z0100 Encounter for examination of eyes and vision without abnormal findings: Secondary | ICD-10-CM | POA: Diagnosis not present

## 2018-06-26 LAB — HM DIABETES FOOT EXAM

## 2018-06-26 NOTE — Progress Notes (Signed)
Subjective:    Patient ID: Chase Moreno, male    DOB: [birth year], 67 y.o.   MRN: [id number]  HPI The patient presents for  complete physical and review of chronic health problems.   The patient saw Candis Musa, LPN for medicare wellness. Note reviewed in detail and important notes copied below.  Health maintenance:  Foot exam - PCP follow-up requested Microalbumin - postponed/PCP follow-up requested Colon cancer screening - PCP follow-up requested Tetanus vaccine - postponed/insurance  Abnormal screenings:   Hearing - failed             Hearing Screening   125Hz  250Hz  500Hz  1000Hz  2000Hz  3000Hz  4000Hz  6000Hz  8000Hz   Right ear:   40 0 40  0    Left ear:   40 40 40  0     Patient concerns:   None  Nurse concerns:  BP elevated @ 140/100. Patient states he has not taken medication as this time. Patient is alert and oriented.   06/26/18 today:  Diabetes:   At goal on no medication. Lab Results  Component Value Date   HGBA1C 5.9 05/29/2018  Using medications without difficulties: Hypoglycemic episodes: Hyperglycemic episodes: Feet problems:no ulcers Blood Sugars averaging: eye exam within last year: due  Microalbuminuria on ACEI.  Hypertension:   Was elevated at [facility] when did not take med. Now better control on medications.  on HCTZ Using medication without problems or lightheadedness:  none Chest pain with exertion:none Edema:none Short of breath:none Average home BPs: Other issues:  Elevated Cholesterol: LDL not at goal < 100 on red yeast rice... 1 daily.. higher dose dry cough. Refuses statin. Lab Results  Component Value Date   CHOL 191 05/29/2018   HDL 44.60 05/29/2018   LDLCALC 132 (H) 05/29/2018   LDLDIRECT 169.8 02/24/2012   TRIG 73.0 05/29/2018   CHOLHDL 4 05/29/2018  Using medications without problems: Muscle aches:  Diet compliance: modearte Exercise: Other complaints:  Wt Readings from Last 3 Encounters:  06/26/18  203 lb 4 oz (92.2 kg)  05/29/18 199 lb 8 oz (90.5 kg)  05/29/17 206 lb (93.4 kg)      Blood pressure 130/74, pulse 76, temperature (!) 97.5 F (36.4 C), temperature source Oral, height 5\' 7"  (1.702 m), weight 203 lb 4 oz (92.2 kg). Social History /Family History/Past Medical History reviewed in detail and updated in EMR if needed.  Review of Systems     Objective:   Physical Exam Constitutional:      General: He is not in acute distress.    Appearance: Normal appearance. He is well-developed. He is not ill-appearing or toxic-appearing.     Comments: Central obesity  HENT:     Head: Normocephalic and atraumatic.     Right Ear: Hearing, tympanic membrane, ear canal and external ear normal.     Left Ear: Hearing, tympanic membrane, ear canal and external ear normal.     Nose: Nose normal.     Mouth/Throat:     Pharynx: Uvula midline.  Eyes:     General: Lids are normal. Lids are everted, no foreign bodies appreciated.     Conjunctiva/sclera: Conjunctivae normal.     Pupils: Pupils are equal, round, and reactive to light.  Neck:     Musculoskeletal: Normal range of motion and neck supple.     Thyroid: No thyroid mass or thyromegaly.     Vascular: No carotid bruit.     Trachea: Trachea and phonation normal.  Cardiovascular:     Rate  and Rhythm: Normal rate and regular rhythm.     Pulses: Normal pulses.     Heart sounds: S1 normal and S2 normal. No murmur. No gallop.   Pulmonary:     Breath sounds: Normal breath sounds. No wheezing, rhonchi or rales.  Abdominal:     General: Bowel sounds are normal.     Palpations: Abdomen is soft.     Tenderness: There is no abdominal tenderness. There is no guarding or rebound.     Hernia: No hernia is present.  Lymphadenopathy:     Cervical: No cervical adenopathy.  Skin:    General: Skin is warm and dry.     Findings: No rash.  Neurological:     Mental Status: He is alert.     Cranial Nerves: No cranial nerve deficit.     Sensory:  No sensory deficit.     Gait: Gait normal.     Deep Tendon Reflexes: Reflexes are normal and symmetric.  Psychiatric:        Speech: Speech normal.        Behavior: Behavior normal.        Judgment: Judgment normal.      Diabetic foot exam: Normal inspection No skin breakdown No calluses  Normal DP pulses Normal sensation to light touch and monofilament Nails normal      Assessment & Plan:  The patient's preventative maintenance and recommended screening tests for an annual wellness exam were reviewed in full today. Brought up to date unless services declined.  Counselled on the importance of diet, exercise, and its role in overall health and mortality. The patient's FH and SH was reviewed, including their home life, tobacco status, and drug and alcohol status.   Vaccines: uptodate flu, due for td and new shingles vaccine .. not covered. Colon: 10/09/2007 Dr. Fuller Plan, polyps, Repeat due in 10 years.  Hep C:neg nonsmoker Prostate:  02/2017 S/p  Robotic assisted prostatectomy followed by Dr. Alinda Money.   ETOH: none

## 2018-06-26 NOTE — Assessment & Plan Note (Signed)
Well controlled. Continue current medication.  

## 2018-06-26 NOTE — Assessment & Plan Note (Signed)
Followed by Dr. Alinda Money. S/P prostatectomy.

## 2018-06-26 NOTE — Assessment & Plan Note (Signed)
Well controlled with diet. 

## 2018-06-26 NOTE — Patient Instructions (Addendum)
Wear ear protection while hunting.  Please stop at the front desk to set up referral.   Try to increase red yeast rice to 2 capsule twice daily if tolerated.  Also can look into atorvastatin low dose or zetia as options if you want.  Work on low cholesterol diet and increase exercise, lower weight. Call Dr. Fuller Plan for repeat colonoscopy for colon cancer. [phone].

## 2018-06-26 NOTE — Assessment & Plan Note (Signed)
Continues to refuse statin but will try to increase red yeast rice. Ask him to consider statin  Or zetia.

## 2018-07-03 ENCOUNTER — Telehealth: Payer: Self-pay

## 2018-07-03 NOTE — Telephone Encounter (Signed)
lmtcb with patient's wife to discuss 2 referrals

## 2018-07-16 ENCOUNTER — Encounter: Payer: Self-pay | Admitting: Family Medicine

## 2018-08-21 DIAGNOSIS — C61 Malignant neoplasm of prostate: Secondary | ICD-10-CM | POA: Diagnosis not present

## 2018-08-21 DIAGNOSIS — N5201 Erectile dysfunction due to arterial insufficiency: Secondary | ICD-10-CM | POA: Diagnosis not present

## 2018-09-18 ENCOUNTER — Other Ambulatory Visit: Payer: Self-pay | Admitting: Family Medicine

## 2018-09-29 DIAGNOSIS — G4733 Obstructive sleep apnea (adult) (pediatric): Secondary | ICD-10-CM | POA: Diagnosis not present

## 2018-12-18 ENCOUNTER — Other Ambulatory Visit: Payer: PPO

## 2018-12-18 ENCOUNTER — Telehealth: Payer: Self-pay | Admitting: Family Medicine

## 2018-12-18 DIAGNOSIS — E119 Type 2 diabetes mellitus without complications: Secondary | ICD-10-CM

## 2018-12-18 NOTE — Telephone Encounter (Signed)
-----   Message from Cloyd Stagers, RT sent at 12/14/2018 11:12 AM EDT ----- Regarding: Lab Orders for Friday 6.5.2020 Please place lab orders for Friday 6.5.2020, 6 month DM f/u appt on Friday 6.12.2020 Thank you, Dyke Maes RT(R)

## 2018-12-25 ENCOUNTER — Ambulatory Visit: Payer: PPO | Admitting: Family Medicine

## 2019-01-12 DIAGNOSIS — G4733 Obstructive sleep apnea (adult) (pediatric): Secondary | ICD-10-CM | POA: Diagnosis not present

## 2019-02-11 IMAGING — MR MR PROSTATE WO/W CM
23 of 56 series · 23 of 56 positions shown · IV contrast (multihance)
Comparison: None.

CLINICAL DATA: Elevated PSA. Negative biopsy. Elevated PSA.
Negative biopsy.

EXAM:
MR PROSTATE WITHOUT AND WITH CONTRAST
TECHNIQUE: Multiplanar multisequence MRI images were obtained of the pelvis
centered about the prostate. Pre and post contrast images were
obtained.
CONTRAST:  18mL MULTIHANCE GADOBENATE DIMEGLUMINE 529 MG/ML IV SOLN

[Series 4: bSSFP fat-sat · axial · 6.0mm · 0.86mm/px · 1 of 44 slices shown]
[im 1/44]
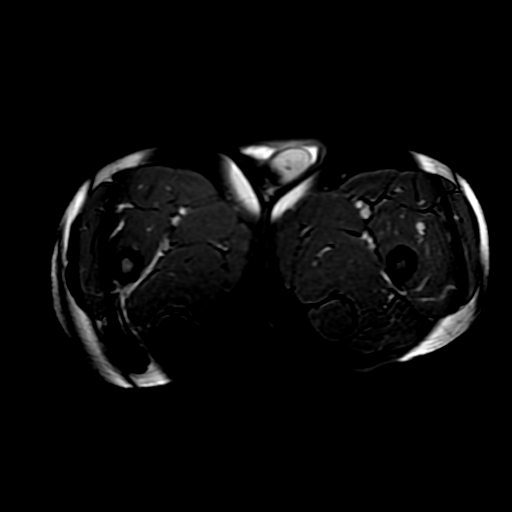

[Series 5: T1 · axial · 6.0mm · 0.86mm/px · 1 of 44 slices shown (1 of 4)]
[im 1/44]
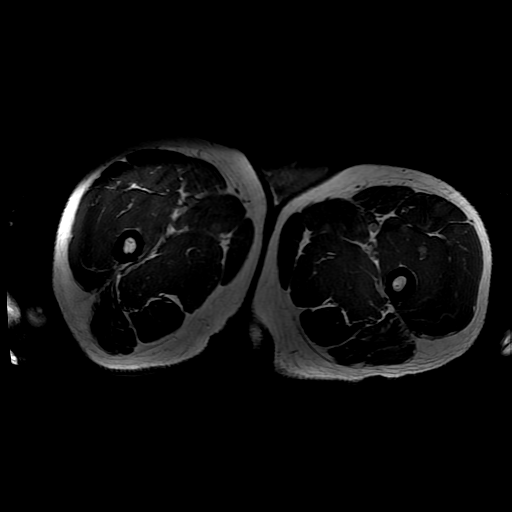

[Series 6: T2 · axial · 3.0mm · 0.29mm/px · 1 of 24 slices shown (1 of 5)]
[im 1/24]
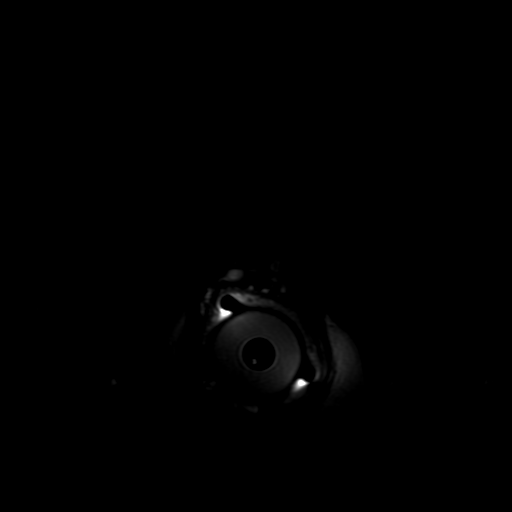

[Series 7: T1 · axial · 3.0mm · 0.29mm/px · 1 of 24 slices shown (2 of 4)]
[im 1/24]
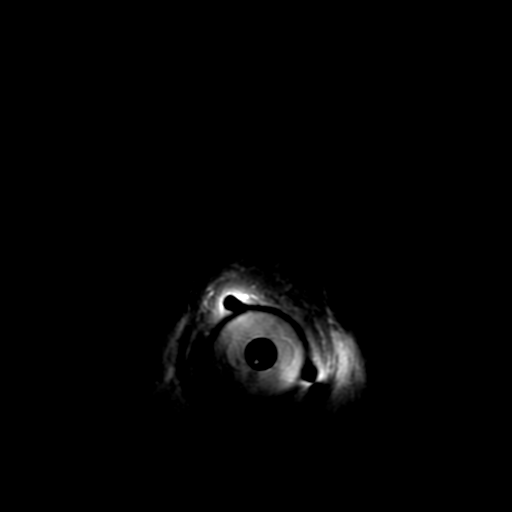

[Series 8: T2 · axial · 3.0mm · 0.29mm/px · 1 of 24 slices shown (2 of 5)]
[im 1/24]
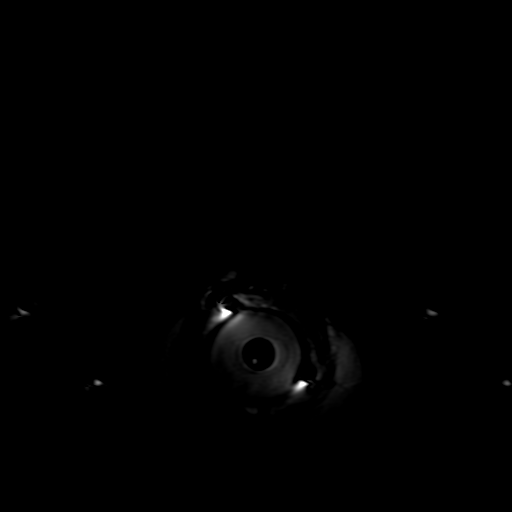

[Series 9: T1 · axial · 3.0mm · 0.31mm/px · 1 of 24 slices shown (3 of 4)]
[im 1/24]
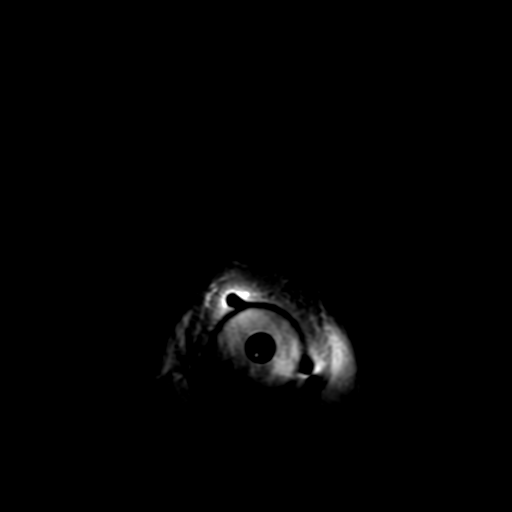

[Series 10: T1 · axial · 3.0mm · 0.31mm/px · 1 of 24 slices shown (4 of 4)]
[im 1/24]
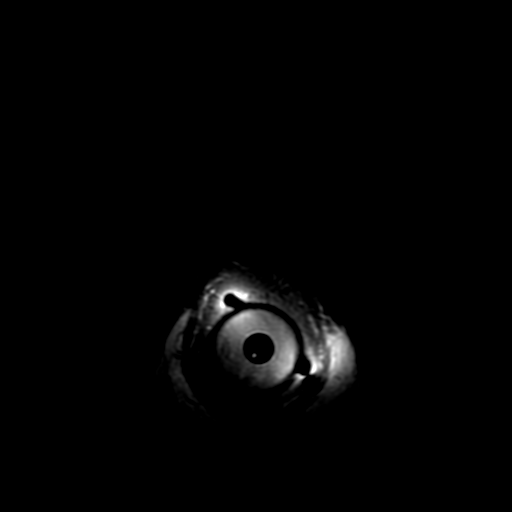

[Series 11: T2 · axial · 1.8mm · 0.47mm/px · 1 of 156 slices shown (3 of 5)]
[im 1/156]
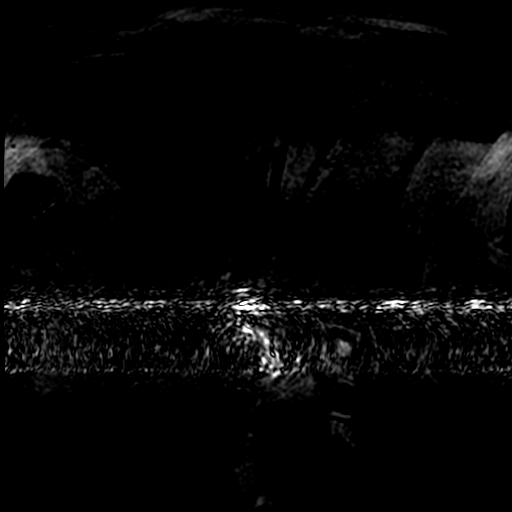

[Series 12: T2 · sagittal · 4.0mm · 0.31mm/px · 1 of 24 slices shown (4 of 5)]
[im 1/24]
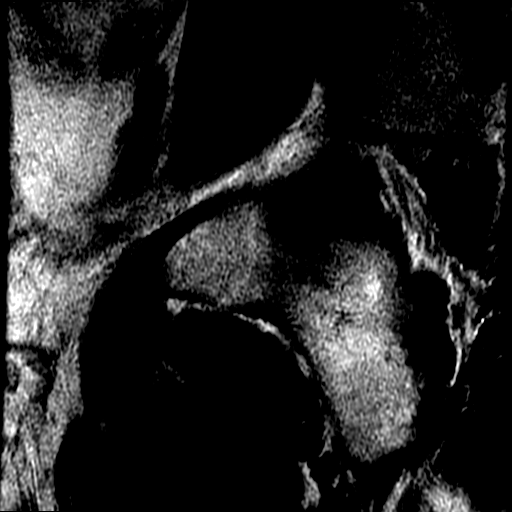

[Series 13: T2 · coronal · 4.0mm · 0.31mm/px · 1 of 20 slices shown (5 of 5)]
[im 1/20]
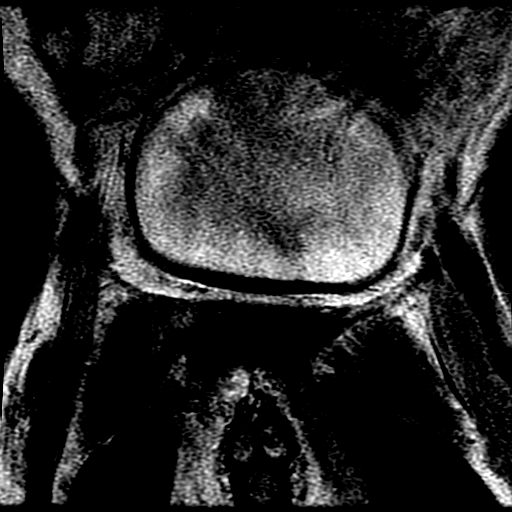

[Series 14: DWI · axial · 3.0mm · 0.59mm/px · 1 of 53 slices shown (1 of 6)]
[im 1/53]
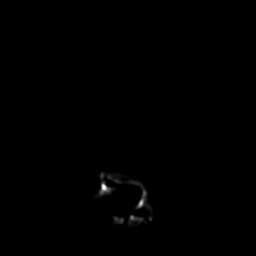

[Series 15: DWI · axial · 3.0mm · 0.59mm/px · 1 of 52 slices shown (2 of 6)]
[im 1/52]
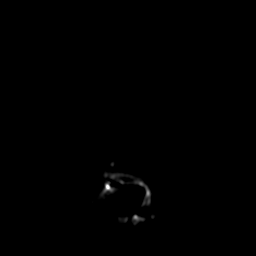

[Series 17: DWI · axial · 3.0mm · 0.59mm/px · 1 of 53 slices shown (3 of 6)]
[im 1/53]
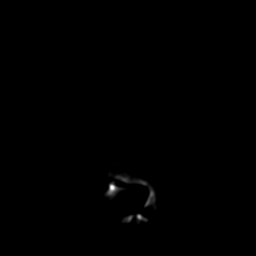

[Series 1100: reformatted · axial · 1.8mm · 0.47mm/px · 1 of 138 slices shown (1 of 2)]
[im 1/138]
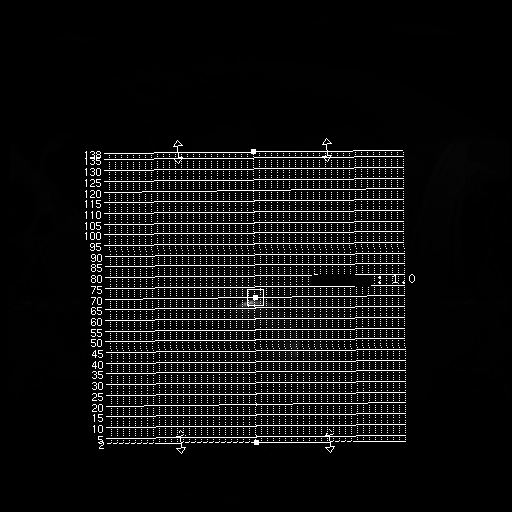

[Series 1102: reformatted · axial · 1.8mm · 0.47mm/px · 1 of 126 slices shown (2 of 2)]
[im 1/126]
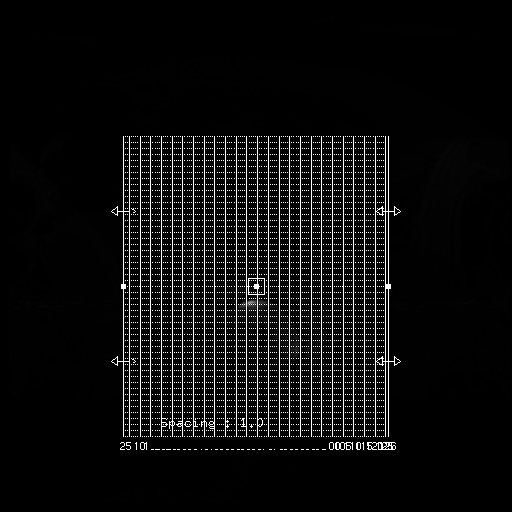

[Series 1400: DWI · axial · 3.0mm · 0.59mm/px · 1 of 27 slices shown (4 of 6)]
[im 1/27]
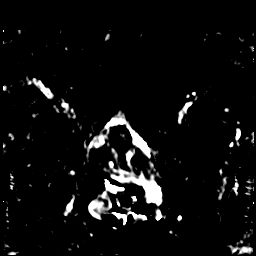

[Series 1500: DWI · axial · 3.0mm · 0.59mm/px · 1 of 27 slices shown (5 of 6)]
[im 1/27]
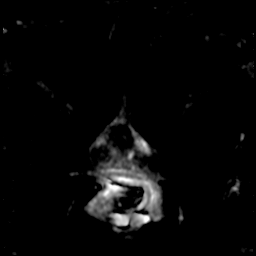

[Series 1700: DWI · axial · 3.0mm · 0.59mm/px · 1 of 27 slices shown (6 of 6)]
[im 1/27]
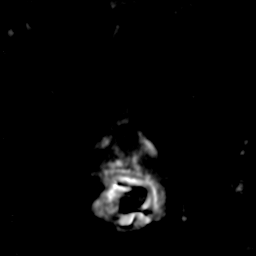

[((id)/(id)/1)-((id)/(id)/1) · axial · 3.0mm · 0.43mm/px · 1 of 76 slices shown (1 of 5)]
[im 1/76]
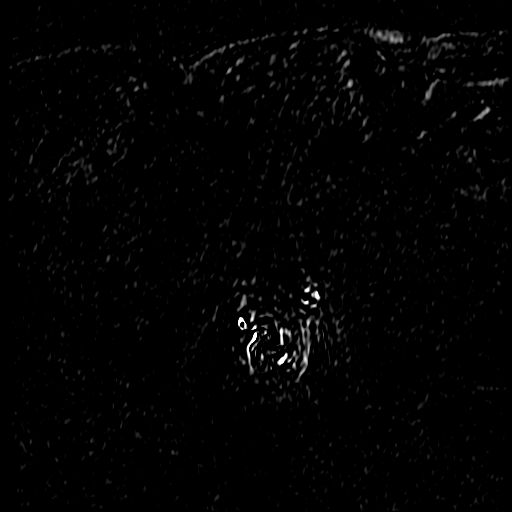

[((id)/(id)/1)-((id)/(id)/1) · axial · 3.0mm · 0.43mm/px · 1 of 75 slices shown (2 of 5)]
[im 1/75]
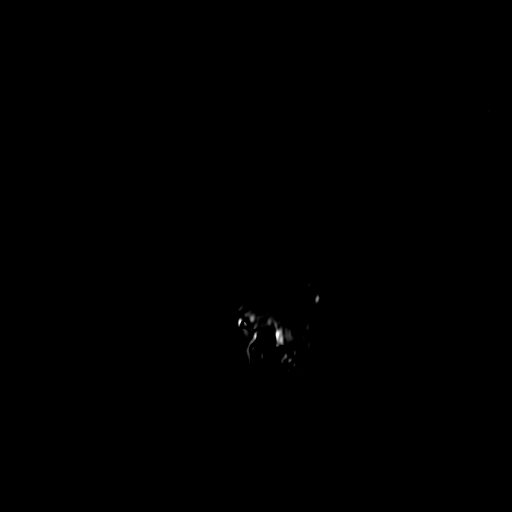

[((id)/(id)/1)-((id)/(id)/1) · axial · 3.0mm · 0.43mm/px · 1 of 76 slices shown (3 of 5)]
[im 1/76]
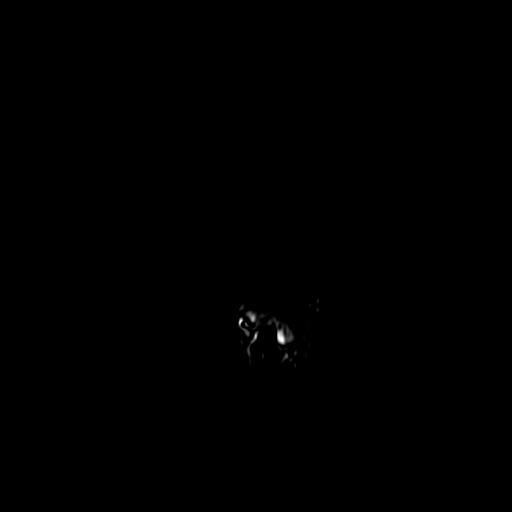

[((id)/(id)/1)-((id)/(id)/1) · axial · 3.0mm · 0.43mm/px · 1 of 76 slices shown (4 of 5)]
[im 1/76]
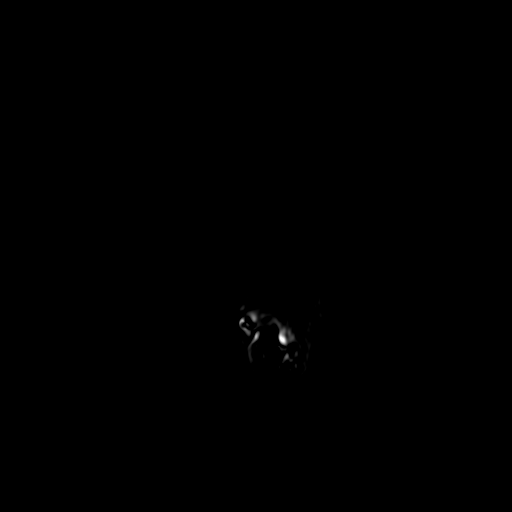

[((id)/(id)/1)-((id)/(id)/1) · axial · 3.0mm · 0.43mm/px · 1 of 76 slices shown (5 of 5)]
[im 1/76]
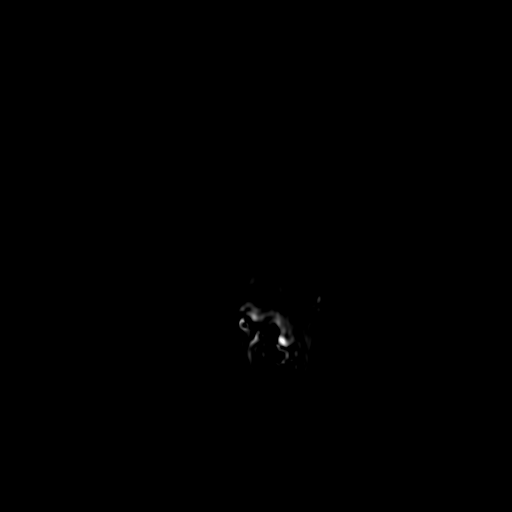

[23 of 56 positions shown; findings below may reference images not displayed]

FINDINGS: Prostate: Normal high signal within the peripheral zone on T2
weighted imaging. No foci of restricted diffusion within the
peripheral zone.

Within the inferolateral aspect of the RIGHT transitional zone,
there is a region of homogeneous single intensity with poorly
defined margins measuring 15 x 12 mm (image 17, series 8). This
region demonstrates mild restricted diffusion (image 9, series 1222)
and also demonstrates post-contrast enhancement.

Volume: Prostate gland measures 5.0 x 2.7 x 4.2 cm (volume = 30
cm^3)

Transcapsular spread:  Absent

Seminal vesicle involvement: Absent

Neurovascular bundle involvement: Abscess

Pelvic adenopathy: Absent

Bone metastasis: Absent

Other findings: Non
IMPRESSION: 1. Concerning lesion within the RIGHT transitional zone (PI-RADS 3)
2. No evidence of high-grade carcinoma in the peripheral zone.
3. Prostatic capsule intact.

## 2019-03-26 DIAGNOSIS — N5201 Erectile dysfunction due to arterial insufficiency: Secondary | ICD-10-CM | POA: Diagnosis not present

## 2019-03-26 DIAGNOSIS — C61 Malignant neoplasm of prostate: Secondary | ICD-10-CM | POA: Diagnosis not present

## 2019-04-12 ENCOUNTER — Other Ambulatory Visit: Payer: Self-pay | Admitting: Family Medicine

## 2019-04-29 ENCOUNTER — Ambulatory Visit (INDEPENDENT_AMBULATORY_CARE_PROVIDER_SITE_OTHER): Payer: PPO

## 2019-04-29 DIAGNOSIS — Z23 Encounter for immunization: Secondary | ICD-10-CM

## 2019-06-07 ENCOUNTER — Telehealth: Payer: Self-pay | Admitting: Family Medicine

## 2019-06-07 DIAGNOSIS — E119 Type 2 diabetes mellitus without complications: Secondary | ICD-10-CM

## 2019-06-07 NOTE — Telephone Encounter (Signed)
-----   Message from Ellamae Sia sent at 05/31/2019  2:47 PM EST ----- Regarding: Lab orders for Wednesday, 11.25.20 Patient is scheduled for CPX labs, please order future labs, Thanks , Karna Christmas

## 2019-06-08 ENCOUNTER — Ambulatory Visit: Payer: PPO

## 2019-06-08 ENCOUNTER — Ambulatory Visit (INDEPENDENT_AMBULATORY_CARE_PROVIDER_SITE_OTHER): Payer: PPO

## 2019-06-08 DIAGNOSIS — Z0000 Encounter for general adult medical examination without abnormal findings: Secondary | ICD-10-CM | POA: Diagnosis not present

## 2019-06-08 NOTE — Progress Notes (Signed)
Subjective:   Chase Moreno is a 68 y.o. male who presents for Medicare Annual/Subsequent preventive examination.  Review of Systems: N/A   This visit is being conducted through telemedicine via telephone at the nurse health advisor's home address due to the COVID-19 pandemic. This patient has given me verbal consent via doximity to conduct this visit, patient states they are participating from their home address. Patient and myself are on the telephone call. There is no referral for this visit. Some vital signs may be absent or patient reported.    Patient identification: identified by name, DOB, and current address   Cardiac Risk Factors include: advanced age (>73men, >21 women);diabetes mellitus;hypertension;male gender;Other (see comment), Risk factor comments: hypercholesterolemia     Objective:    Vitals: There were no vitals taken for this visit.  There is no height or weight on file to calculate BMI.  Advanced Directives 06/08/2019 05/29/2018 05/27/2017 02/17/2017 02/12/2017 11/28/2015 01/30/2014  Does Patient Have a Medical Advance Directive? Yes Yes No No No Yes Patient does not have advance directive;Patient would not like information  Type of Scientist, forensic Power of [place];Living will Living will - - - [place];Living will -  Does patient want to make changes to medical advance directive? - - - - - No - Patient declined -  Copy of [place] in Chart? No - copy requested - - - - No - copy requested -  Would patient like information on creating a medical advance directive? - - Yes (MAU/Ambulatory/Procedural Areas - Information given) No - Patient declined No - Patient declined - -    Tobacco Social History   Tobacco Use  Smoking Status Former Smoker  Smokeless Tobacco Never Used  Tobacco Comment   quit over 40 years ago     Counseling given: Not Answered Comment: quit over 40 years ago   Clinical Intake:  Pre-visit  preparation completed: Yes  Pain : No/denies pain     Nutritional Risks: None Diabetes: Yes CBG done?: No Did pt. bring in CBG monitor from home?: No  How often do you need to have someone help you when you read instructions, pamphlets, or other written materials from your doctor or pharmacy?: 1 - Never What is the last grade level you completed in school?: 12th  Interpreter Needed?: No  Information entered by :: CJohnson, LPN  Past Medical History:  Diagnosis Date  . Allergic rhinitis, cause unspecified   . Cancer [facility])    prostate cancer 2018  . Diabetes mellitus without complication [facility])    patient denies although dx in 2017 with a Dr Diona Browner ; last hgA1c was however 5.5   . Unspecified essential hypertension   . Unspecified sleep apnea    CPAP machine use    Past Surgical History:  Procedure Laterality Date  . FRACTURE SURGERY     left arm   . LYMPHADENECTOMY Bilateral 02/17/2017   Procedure: LYMPHADENECTOMY;  Surgeon: Raynelle Bring, MD;  Location: [facility];  Service: Urology;  Laterality: Bilateral;  . ROBOT ASSISTED LAPAROSCOPIC RADICAL PROSTATECTOMY N/A 02/17/2017   Procedure: XI ROBOTIC ASSISTED LAPAROSCOPIC RADICAL PROSTATECTOMY LEVEL 2;  Surgeon: Raynelle Bring, MD;  Location: [facility];  Service: Urology;  Laterality: N/A;  . TONSILLECTOMY     Family History  Problem Relation Age of Onset  . Arthritis Mother   . Arrhythmia Mother   . Stroke Paternal Grandmother    Social History   Socioeconomic History  . Marital status:  Married    Spouse name: Not on file  . Number of children: 2  . Years of education: Not on file  . Highest education level: Not on file  Occupational History  . Occupation: Writer    Comment: [place]  . Financial resource strain: Not hard at all  . Food insecurity    Worry: Never true    Inability: Never true  . Transportation needs    Medical: No    Non-medical: No  Tobacco Use  . Smoking status:  Former Research scientist (life sciences)  . Smokeless tobacco: Never Used  . Tobacco comment: quit over 40 years ago  Substance and Sexual Activity  . Alcohol use: No    Alcohol/week: 0.0 standard drinks  . Drug use: No  . Sexual activity: Not on file  Lifestyle  . Physical activity    Days per week: 3 days    Minutes per session: 60 min  . Stress: Not at all  Relationships  . Social Herbalist on phone: Not on file    Gets together: Not on file    Attends religious service: Not on file    Active member of club or organization: Not on file    Attends meetings of clubs or organizations: Not on file    Relationship status: Not on file  Other Topics Concern  . Not on file  Social History Narrative   Regular exercise---yes, rabbit hunting, walking 2 times  A week.      Diet---eating fruit and veggies, limiting meat.      Widowed: Remarried.          Outpatient Encounter Medications as of 06/08/2019  Medication Sig  . losartan (COZAAR) 50 MG tablet Take 1 tablet by mouth once daily  . hydrochlorothiazide (MICROZIDE) 12.5 MG capsule Take 1 capsule (12.5 mg total) by mouth daily. (Patient not taking: Reported on 06/08/2019)   No facility-administered encounter medications on file as of 06/08/2019.     Activities of Daily Living In your present state of health, do you have any difficulty performing the following activities: 06/08/2019  Hearing? N  Vision? N  Difficulty concentrating or making decisions? N  Walking or climbing stairs? N  Dressing or bathing? N  Doing errands, shopping? N  Preparing Food and eating ? N  Using the Toilet? N  In the past six months, have you accidently leaked urine? N  Do you have problems with loss of bowel control? N  Managing your Medications? N  Managing your Finances? N  Housekeeping or managing your Housekeeping? N  Some recent data might be hidden    Patient Care Team: Jinny Sanders, MD as PCP - General Raynelle Bring, MD as Consulting  Physician (Urology) Festus Aloe, MD as Consulting Physician (Urology) Parrett, Fonnie Mu, NP as Nurse Practitioner (Pulmonary Disease)   Assessment:   This is a routine wellness examination for Chase Moreno.  Exercise Activities and Dietary recommendations Current Exercise Habits: Home exercise routine, Type of exercise: walking, Time (Minutes): 60, Frequency (Times/Week): 3, Weekly Exercise (Minutes/Week): 180, Intensity: Moderate, Exercise limited by: None identified  Goals    . Increase physical activity     Starting 05/29/2018, I will continue to walk or to hunt for at least 60 min 3-4 days per week.     . Patient Stated     06/08/2019, I will maintain and continue medications as prescribed.        Fall Risk Fall Risk  06/08/2019 05/29/2018 05/27/2017 11/28/2015  Falls in the past year? 0 0 No No  Number falls in past yr: 0 - - -  Injury with Fall? 0 - - -  Risk for fall due to : Medication side effect - - -  Follow up Falls evaluation completed;Falls prevention discussed - - -   Is the patient's home free of loose throw rugs in walkways, pet beds, electrical cords, etc?   yes      Grab bars in the bathroom? no      Handrails on the stairs?   yes      Adequate lighting?   yes  Timed Get Up and Go Performed: N/A  Depression Screen PHQ 2/9 Scores 06/08/2019 05/29/2018 05/27/2017 11/28/2015  PHQ - 2 Score 0 0 0 0  PHQ- 9 Score 0 0 0 -    Cognitive Function MMSE - Mini Mental State Exam 06/08/2019 05/29/2018 05/27/2017  Orientation to time 5 5 5   Orientation to Place 5 5 5   Registration 3 3 3   Attention/ Calculation 5 0 0  Recall 3 3 3   Language- name 2 objects - 0 0  Language- repeat 1 1 1   Language- follow 3 step command - 3 3  Language- read & follow direction - 0 0  Write a sentence - 0 0  Copy design - 0 0  Total score - 20 20  Mini Cog  Mini-Cog screen was completed. Maximum score is 22. A value of 0 denotes this part of the MMSE was not completed or the  patient failed this part of the Mini-Cog screening.       Immunization History  Administered Date(s) Administered  . Fluad Quad(high Dose 65+) 04/29/2019  . Influenza Nasal 04/14/2013  . Influenza,inj,Quad PF,6+ Mos 09/12/2015, 04/13/2018  . Influenza,trivalent, recombinat, inj, PF 05/04/2017  . Influenza-Unspecified 04/18/2014  . Pneumococcal Conjugate-13 09/12/2015  . Pneumococcal Polysaccharide-23 05/27/2017  . Td 08/24/2007  . Zoster 11/09/2014    Qualifies for Shingles Vaccine? Yes  Screening Tests Health Maintenance  Topic Date Due  . OPHTHALMOLOGY EXAM  06/20/2018  . HEMOGLOBIN A1C  11/27/2018  . COLONOSCOPY  07/15/2019 (Originally 10/08/2017)  . TETANUS/TDAP  07/15/2019 (Originally 08/23/2017)  . FOOT EXAM  06/27/2019  . INFLUENZA VACCINE  Completed  . Hepatitis C Screening  Completed  . PNA vac Low Risk Adult  Completed   Cancer Screenings: Lung: Low Dose CT Chest recommended if Age 71-80 years, 30 pack-year currently smoking OR have quit w/in 15years. Patient does not qualify. Colorectal: Provider notified to place referral for test  Additional Screenings:  Hepatitis C Screening: 11/04/2014      Plan:    Patient wants to maintain and continue medications as prescribed.   I have personally reviewed and noted the following in the patient's chart:   . Medical and social history . Use of alcohol, tobacco or illicit drugs  . Current medications and supplements . Functional ability and status . Nutritional status . Physical activity . Advanced directives . List of other physicians . Hospitalizations, surgeries, and ER visits in previous 12 months . Vitals . Screenings to include cognitive, depression, and falls . Referrals and appointments  In addition, I have reviewed and discussed with patient certain preventive protocols, quality metrics, and best practice recommendations. A written personalized care plan for preventive services as well as general  preventive health recommendations were provided to patient.     Andrez Grime, LPN  [id number]

## 2019-06-08 NOTE — Progress Notes (Signed)
PCP notes:  Health Maintenance: Needs referral for colonoscopy Will schedule eye exam when pandemic improves   Abnormal Screenings: none   Patient concerns: none   Nurse concerns: none   Next PCP appt.: 06/15/2019 @ 8:40 am

## 2019-06-08 NOTE — Patient Instructions (Signed)
Chase Moreno , Thank you for taking time to come for your Medicare Wellness Visit. I appreciate your ongoing commitment to your health goals. Please review the following plan we discussed and let me know if I can assist you in the future.   Screening recommendations/referrals: Colonoscopy: referral will be placed Recommended yearly ophthalmology/optometry visit for glaucoma screening and checkup Recommended yearly dental visit for hygiene and checkup  Vaccinations: Influenza vaccine: Up to date, completed 04/29/2019 Pneumococcal vaccine: Completed series Tdap vaccine: decline Shingles vaccine: will check with insurance    Advanced directives: Please bring a copy of your POA (Power of Attorney) and/or Living Will to your next appointment.   Conditions/risks identified: diabetes, hypertension, hypercholesterolemia  Next appointment: 06/15/2019 @ 8:40 am   Preventive Care 69 Years and Older, Male Preventive care refers to lifestyle choices and visits with your health care provider that can promote health and wellness. What does preventive care include?  A yearly physical exam. This is also called an annual well check.  Dental exams once or twice a year.  Routine eye exams. Ask your health care provider how often you should have your eyes checked.  Personal lifestyle choices, including:  Daily care of your teeth and gums.  Regular physical activity.  Eating a healthy diet.  Avoiding tobacco and drug use.  Limiting alcohol use.  Practicing safe sex.  Taking low doses of aspirin every day.  Taking vitamin and mineral supplements as recommended by your health care provider. What happens during an annual well check? The services and screenings done by your health care provider during your annual well check will depend on your age, overall health, lifestyle risk factors, and family history of disease. Counseling  Your health care provider may ask you questions about your:   Alcohol use.  Tobacco use.  Drug use.  Emotional well-being.  Home and relationship well-being.  Sexual activity.  Eating habits.  History of falls.  Memory and ability to understand (cognition).  Work and work Statistician. Screening  You may have the following tests or measurements:  Height, weight, and BMI.  Blood pressure.  Lipid and cholesterol levels. These may be checked every 5 years, or more frequently if you are over 53 years old.  Skin check.  Lung cancer screening. You may have this screening every year starting at age 32 if you have a 30-pack-year history of smoking and currently smoke or have quit within the past 15 years.  Fecal occult blood test (FOBT) of the stool. You may have this test every year starting at age 39.  Flexible sigmoidoscopy or colonoscopy. You may have a sigmoidoscopy every 5 years or a colonoscopy every 10 years starting at age 23.  Prostate cancer screening. Recommendations will vary depending on your family history and other risks.  Hepatitis C blood test.  Hepatitis B blood test.  Sexually transmitted disease (STD) testing.  Diabetes screening. This is done by checking your blood sugar (glucose) after you have not eaten for a while (fasting). You may have this done every 1-3 years.  Abdominal aortic aneurysm (AAA) screening. You may need this if you are a current or former smoker.  Osteoporosis. You may be screened starting at age 32 if you are at high risk. Talk with your health care provider about your test results, treatment options, and if necessary, the need for more tests. Vaccines  Your health care provider may recommend certain vaccines, such as:  Influenza vaccine. This is recommended every year.  Tetanus,  diphtheria, and acellular pertussis (Tdap, Td) vaccine. You may need a Td booster every 10 years.  Zoster vaccine. You may need this after age 32.  Pneumococcal 13-valent conjugate (PCV13) vaccine. One dose is  recommended after age 83.  Pneumococcal polysaccharide (PPSV23) vaccine. One dose is recommended after age 62. Talk to your health care provider about which screenings and vaccines you need and how often you need them. This information is not intended to replace advice given to you by your health care provider. Make sure you discuss any questions you have with your health care provider. Document Released: 07/28/2015 Document Revised: 03/20/2016 Document Reviewed: 05/02/2015 Elsevier Interactive Patient Education  2017 [place] Prevention in the Home Falls can cause injuries. They can happen to people of all ages. There are many things you can do to make your home safe and to help prevent falls. What can I do on the outside of my home?  Regularly fix the edges of walkways and driveways and fix any cracks.  Remove anything that might make you trip as you walk through a door, such as a raised step or threshold.  Trim any bushes or trees on the path to your home.  Use bright outdoor lighting.  Clear any walking paths of anything that might make someone trip, such as rocks or tools.  Regularly check to see if handrails are loose or broken. Make sure that both sides of any steps have handrails.  Any raised decks and porches should have guardrails on the edges.  Have any leaves, snow, or ice cleared regularly.  Use sand or salt on walking paths during winter.  Clean up any spills in your garage right away. This includes oil or grease spills. What can I do in the bathroom?  Use night lights.  Install grab bars by the toilet and in the tub and shower. Do not use towel bars as grab bars.  Use non-skid mats or decals in the tub or shower.  If you need to sit down in the shower, use a plastic, non-slip stool.  Keep the floor dry. Clean up any water that spills on the floor as soon as it happens.  Remove soap buildup in the tub or shower regularly.  Attach bath mats  securely with double-sided non-slip rug tape.  Do not have throw rugs and other things on the floor that can make you trip. What can I do in the bedroom?  Use night lights.  Make sure that you have a light by your bed that is easy to reach.  Do not use any sheets or blankets that are too big for your bed. They should not hang down onto the floor.  Have a firm chair that has side arms. You can use this for support while you get dressed.  Do not have throw rugs and other things on the floor that can make you trip. What can I do in the kitchen?  Clean up any spills right away.  Avoid walking on wet floors.  Keep items that you use a lot in easy-to-reach places.  If you need to reach something above you, use a strong step stool that has a grab bar.  Keep electrical cords out of the way.  Do not use floor polish or wax that makes floors slippery. If you must use wax, use non-skid floor wax.  Do not have throw rugs and other things on the floor that can make you trip. What can I do with  my stairs?  Do not leave any items on the stairs.  Make sure that there are handrails on both sides of the stairs and use them. Fix handrails that are broken or loose. Make sure that handrails are as long as the stairways.  Check any carpeting to make sure that it is firmly attached to the stairs. Fix any carpet that is loose or worn.  Avoid having throw rugs at the top or bottom of the stairs. If you do have throw rugs, attach them to the floor with carpet tape.  Make sure that you have a light switch at the top of the stairs and the bottom of the stairs. If you do not have them, ask someone to add them for you. What else can I do to help prevent falls?  Wear shoes that:  Do not have high heels.  Have rubber bottoms.  Are comfortable and fit you well.  Are closed at the toe. Do not wear sandals.  If you use a stepladder:  Make sure that it is fully opened. Do not climb a closed  stepladder.  Make sure that both sides of the stepladder are locked into place.  Ask someone to hold it for you, if possible.  Clearly mark and make sure that you can see:  Any grab bars or handrails.  First and last steps.  Where the edge of each step is.  Use tools that help you move around (mobility aids) if they are needed. These include:  Canes.  Walkers.  Scooters.  Crutches.  Turn on the lights when you go into a dark area. Replace any light bulbs as soon as they burn out.  Set up your furniture so you have a clear path. Avoid moving your furniture around.  If any of your floors are uneven, fix them.  If there are any pets around you, be aware of where they are.  Review your medicines with your doctor. Some medicines can make you feel dizzy. This can increase your chance of falling. Ask your doctor what other things that you can do to help prevent falls. This information is not intended to replace advice given to you by your health care provider. Make sure you discuss any questions you have with your health care provider. Document Released: 04/27/2009 Document Revised: 12/07/2015 Document Reviewed: 08/05/2014 Elsevier Interactive Patient Education  2017 [employer].

## 2019-06-09 ENCOUNTER — Other Ambulatory Visit (INDEPENDENT_AMBULATORY_CARE_PROVIDER_SITE_OTHER): Payer: PPO

## 2019-06-09 ENCOUNTER — Other Ambulatory Visit: Payer: Self-pay

## 2019-06-09 DIAGNOSIS — E119 Type 2 diabetes mellitus without complications: Secondary | ICD-10-CM | POA: Diagnosis not present

## 2019-06-09 LAB — LIPID PANEL
Cholesterol: 203 mg/dL — ABNORMAL HIGH (ref 0–200)
HDL: 44.5 mg/dL (ref >39.00)
LDL Cholesterol: 144 mg/dL — ABNORMAL HIGH (ref 0–99)
NonHDL: 158.99
Total CHOL/HDL Ratio: 5
Triglycerides: 73 mg/dL (ref 0.0–149.0)
VLDL: 14.6 mg/dL (ref 0.0–40.0)

## 2019-06-09 LAB — COMPREHENSIVE METABOLIC PANEL
ALT: 22 U/L (ref 0–53)
AST: 27 U/L (ref 0–37)
Albumin: 3.9 g/dL (ref 3.5–5.2)
Alkaline Phosphatase: 66 U/L (ref 39–117)
BUN: 13 mg/dL (ref 6–23)
CO2: 28 mEq/L (ref 19–32)
Calcium: 9.1 mg/dL (ref 8.4–10.5)
Chloride: 106 mEq/L (ref 96–112)
Creatinine, Ser: 1.19 mg/dL (ref 0.40–1.50)
GFR: 73.28 mL/min (ref >60.00)
Glucose, Bld: 99 mg/dL (ref 70–99)
Potassium: 4.3 mEq/L (ref 3.5–5.1)
Sodium: 140 mEq/L (ref 135–145)
Total Bilirubin: 0.4 mg/dL (ref 0.2–1.2)
Total Protein: 7.6 g/dL (ref 6.0–8.3)

## 2019-06-09 LAB — HEMOGLOBIN A1C: Hgb A1c MFr Bld: 5.8 % (ref 4.6–6.5)

## 2019-06-15 ENCOUNTER — Encounter: Payer: Self-pay | Admitting: Family Medicine

## 2019-06-15 ENCOUNTER — Other Ambulatory Visit: Payer: Self-pay

## 2019-06-15 ENCOUNTER — Ambulatory Visit (INDEPENDENT_AMBULATORY_CARE_PROVIDER_SITE_OTHER): Payer: PPO | Admitting: Family Medicine

## 2019-06-15 ENCOUNTER — Encounter: Payer: PPO | Admitting: Family Medicine

## 2019-06-15 VITALS — BP 120/90 | HR 75 | Temp 98.8°F | Ht 66.0 in | Wt 199.0 lb

## 2019-06-15 DIAGNOSIS — E119 Type 2 diabetes mellitus without complications: Secondary | ICD-10-CM

## 2019-06-15 DIAGNOSIS — E7800 Pure hypercholesterolemia, unspecified: Secondary | ICD-10-CM | POA: Diagnosis not present

## 2019-06-15 DIAGNOSIS — Z0001 Encounter for general adult medical examination with abnormal findings: Secondary | ICD-10-CM

## 2019-06-15 DIAGNOSIS — L918 Other hypertrophic disorders of the skin: Secondary | ICD-10-CM | POA: Diagnosis not present

## 2019-06-15 DIAGNOSIS — Z0000 Encounter for general adult medical examination without abnormal findings: Secondary | ICD-10-CM

## 2019-06-15 DIAGNOSIS — H9193 Unspecified hearing loss, bilateral: Secondary | ICD-10-CM | POA: Diagnosis not present

## 2019-06-15 DIAGNOSIS — I10 Essential (primary) hypertension: Secondary | ICD-10-CM

## 2019-06-15 DIAGNOSIS — H6121 Impacted cerumen, right ear: Secondary | ICD-10-CM | POA: Insufficient documentation

## 2019-06-15 MED ORDER — ATORVASTATIN CALCIUM 10 MG PO TABS: 10.0000 mg | ORAL_TABLET | Freq: Every day | ORAL | 11 refills | Status: DC

## 2019-06-15 NOTE — Patient Instructions (Addendum)
Start low dose cholesterol medication daily.Marland Kitchen if you do not tolerate medication try taking 3 days a week. Return  In 3 months for cholesterol recheck lab only. Set up yearly eye exam.  Keep area treated clean and dry. Expecte blistering or scabbing reaction. Do not pick at the areas. Expect hypopigmented scars from the procedure. Return if lesions fail to fully resolve or redness spreading.

## 2019-06-15 NOTE — Progress Notes (Signed)
No critical labs need to be addressed urgently. We will discuss labs in detail at upcoming office visit.   

## 2019-06-15 NOTE — Progress Notes (Addendum)
Chief Complaint  Patient presents with  . Annual Exam    Part 2    History of Present Illness: HPI   The patient presents for complete physical and review of chronic health problems. He/She also has the following acute concerns today: he has noted hearing loss in both ears, roaring in ears... wishes to have evaluated further.  He also has a skin tag over his left eye that obstructs his vision.. no bleeding , no redness.   The patient saw a LPN or RN for medicare wellness visit.  Prevention and wellness was reviewed in detail. Note reviewed and important notes copied below.  PCP notes:  Health Maintenance: Needs referral for colonoscopy Will schedule eye exam when pandemic improves    06/15/19  Hypertension:   Good control on HCTZ and losartan   BP Readings from Last 3 Encounters:  06/26/18 130/74  05/29/18 (!) 140/100  05/29/17 128/80   Using medication without problems or lightheadedness: none Chest pain with exertion:none Edema:none Short of breath:none Average home BPs: Other issues:  Diabetes:   Well controlled with diet Lab Results  Component Value Date   HGBA1C 5.8 06/09/2019  Using medications without difficulties: Hypoglycemic episodes: Hyperglycemic episodes: Feet problems: no ulcers Blood Sugars averaging: eye exam within last year: due Wt Readings from Last 3 Encounters:  06/15/19 199 lb (90.3 kg)  06/26/18 203 lb 4 oz (92.2 kg)  05/29/18 199 lb 8 oz (90.5 kg)    Elevated Cholesterol: LDL not at goal < 100 on  1200 mg red yeast rice...  Refuses statin. Lab Results  Component Value Date   CHOL 203 (H) 06/09/2019   HDL 44.50 06/09/2019   LDLCALC 144 (H) 06/09/2019   LDLDIRECT 169.8 02/24/2012   TRIG 73.0 06/09/2019   CHOLHDL 5 06/09/2019  Using medications without problems: Muscle aches:  Diet compliance: good Exercise: walking Other complaints:      This visit occurred during the SARS-CoV-2 public health emergency.  Safety  protocols were in place, including screening questions prior to the visit, additional usage of staff PPE, and extensive cleaning of exam room while observing appropriate contact time as indicated for disinfecting solutions.   COVID 19 screen:  No recent travel or known exposure to COVID19 The patient denies respiratory symptoms of COVID 19 at this time. The importance of social distancing was discussed today.     Review of Systems  Constitutional: Negative for chills and fever.  HENT: Negative for congestion and ear pain.   Eyes: Negative for pain and redness.  Respiratory: Negative for cough and shortness of breath.   Cardiovascular: Negative for chest pain, palpitations and leg swelling.  Gastrointestinal: Negative for abdominal pain, blood in stool, constipation, diarrhea, nausea and vomiting.  Genitourinary: Negative for dysuria.  Musculoskeletal: Negative for falls and myalgias.  Skin: Negative for rash.  Neurological: Negative for dizziness.  Psychiatric/Behavioral: Negative for depression. The patient is not nervous/anxious.       Past Medical History:  Diagnosis Date  . Allergic rhinitis, cause unspecified   . Cancer [facility])    prostate cancer 2018  . Diabetes mellitus without complication [facility])    patient denies although dx in 2017 with a Dr Diona Browner ; last hgA1c was however 5.5   . Unspecified essential hypertension   . Unspecified sleep apnea    CPAP machine use     reports that he has quit smoking. He has never used smokeless tobacco. He reports that he does not drink alcohol or use  drugs.   Current Outpatient Medications:  .  hydrochlorothiazide (MICROZIDE) 12.5 MG capsule, Take 1 capsule (12.5 mg total) by mouth daily. (Patient not taking: Reported on 06/08/2019), Disp: 90 capsule, Rfl: 1 .  losartan (COZAAR) 50 MG tablet, Take 1 tablet by mouth once daily, Disp: 90 tablet, Rfl: 0   Observations/Objective: Pulse 75, temperature 98.8 F (37.1 C), temperature source  Temporal, height 5\' 6"  (1.676 m), weight 199 lb (90.3 kg), SpO2 97 %.  Physical Exam Constitutional:      General: He is not in acute distress.    Appearance: Normal appearance. He is well-developed. He is not ill-appearing or toxic-appearing.  HENT:     Head: Normocephalic and atraumatic.     Right Ear: Hearing, tympanic membrane, ear canal and external ear normal.     Left Ear: Hearing, tympanic membrane, ear canal and external ear normal.     Nose: Nose normal.     Mouth/Throat:     Pharynx: Uvula midline.  Eyes:     General: Lids are normal. Lids are everted, no foreign bodies appreciated.     Conjunctiva/sclera: Conjunctivae normal.     Pupils: Pupils are equal, round, and reactive to light.  Neck:     Musculoskeletal: Normal range of motion and neck supple.     Thyroid: No thyroid mass or thyromegaly.     Vascular: No carotid bruit.     Trachea: Trachea and phonation normal.  Cardiovascular:     Rate and Rhythm: Normal rate and regular rhythm.     Pulses: Normal pulses.     Heart sounds: S1 normal and S2 normal. No murmur. No gallop.   Pulmonary:     Breath sounds: Normal breath sounds. No wheezing, rhonchi or rales.  Abdominal:     General: Bowel sounds are normal.     Palpations: Abdomen is soft.     Tenderness: There is no abdominal tenderness. There is no guarding or rebound.     Hernia: No hernia is present.  Lymphadenopathy:     Cervical: No cervical adenopathy.  Skin:    General: Skin is warm and dry.     Findings: No rash.     Comments: Skin tag, 3 mm over left eyelid, obstructs vision when blinking  Neurological:     Mental Status: He is alert.     Cranial Nerves: No cranial nerve deficit.     Sensory: No sensory deficit.     Gait: Gait normal.     Deep Tendon Reflexes: Reflexes are normal and symmetric.  Psychiatric:        Speech: Speech normal.        Behavior: Behavior normal.        Judgment: Judgment normal.      Assessment and Plan   The  patient's preventative maintenance and recommended screening tests for an annual wellness exam were reviewed in full today. Brought up to date unless services declined.  Counselled on the importance of diet, exercise, and its role in overall health and mortality. The patient's FH and SH was reviewed, including their home life, tobacco status, and drug and alcohol status.   Vaccines: uptodate flu, due for td and new shingles vaccine .. not covered. Colon: 10/09/2007 Dr. Fuller Plan, polyps, Repeat due in 10 years. Plan instead Cologuard. Hep C:neg nonsmoker Prostate:02/2017 S/p Robotic assisted prostatectomy followed by Dr. Alinda Money.  Yearly visit  ETOH: none   procedure note:  Patient consented to procedure. Liquid nitrogen was applied for 10-12  seconds to the skin lesions and the expected blistering or scabbing reaction explained. Do not pick at the areas. Patient reminded to expect hypopigmented scars from the procedure. Return if lesions fail to fully resolve. Pt tolerated procedure well.  Eliezer Lofts, MD

## 2019-06-15 NOTE — Assessment & Plan Note (Signed)
Reason for removal.. obstructs vision  Liquid nitrogen was applied for 10-12 seconds to the skin lesions and the expected blistering or scabbing reaction explained. Do not pick at the areas. Patient reminded to expect hypopigmented scars from the procedure. Return if lesions fail to fully resolve.

## 2019-06-15 NOTE — Assessment & Plan Note (Addendum)
AHA 10 year risk: 34%! Pt finally agreed to statin mediaiton trial.. start atorvastatin 10 mg.. re-eval in 3 months.

## 2019-06-15 NOTE — Assessment & Plan Note (Signed)
Refer to audiologist for further eval.

## 2019-06-15 NOTE — Assessment & Plan Note (Signed)
Diet controlled.  

## 2019-06-15 NOTE — Assessment & Plan Note (Signed)
Well controlled. Continue current medication.  

## 2019-06-30 DIAGNOSIS — Z1211 Encounter for screening for malignant neoplasm of colon: Secondary | ICD-10-CM | POA: Diagnosis not present

## 2019-06-30 DIAGNOSIS — Z1212 Encounter for screening for malignant neoplasm of rectum: Secondary | ICD-10-CM | POA: Diagnosis not present

## 2019-07-05 LAB — COLOGUARD: Cologuard: NEGATIVE

## 2019-07-14 ENCOUNTER — Encounter: Payer: Self-pay | Admitting: Family Medicine

## 2019-07-20 NOTE — Progress Notes (Signed)
Sorry with the holiday.. I have not done this yet... will try to do in next few days.

## 2019-07-22 ENCOUNTER — Other Ambulatory Visit: Payer: Self-pay | Admitting: Family Medicine

## 2019-08-16 DIAGNOSIS — Z8249 Family history of ischemic heart disease and other diseases of the circulatory system: Secondary | ICD-10-CM | POA: Diagnosis not present

## 2019-08-16 DIAGNOSIS — Z85038 Personal history of other malignant neoplasm of large intestine: Secondary | ICD-10-CM | POA: Diagnosis not present

## 2019-08-16 DIAGNOSIS — E785 Hyperlipidemia, unspecified: Secondary | ICD-10-CM | POA: Diagnosis not present

## 2019-08-16 DIAGNOSIS — Z6831 Body mass index (BMI) 31.0-31.9, adult: Secondary | ICD-10-CM | POA: Diagnosis not present

## 2019-08-16 DIAGNOSIS — Z833 Family history of diabetes mellitus: Secondary | ICD-10-CM | POA: Diagnosis not present

## 2019-08-16 DIAGNOSIS — N529 Male erectile dysfunction, unspecified: Secondary | ICD-10-CM | POA: Diagnosis not present

## 2019-08-16 DIAGNOSIS — G4733 Obstructive sleep apnea (adult) (pediatric): Secondary | ICD-10-CM | POA: Diagnosis not present

## 2019-08-16 DIAGNOSIS — E669 Obesity, unspecified: Secondary | ICD-10-CM | POA: Diagnosis not present

## 2019-08-16 DIAGNOSIS — Z7982 Long term (current) use of aspirin: Secondary | ICD-10-CM | POA: Diagnosis not present

## 2019-08-16 DIAGNOSIS — I10 Essential (primary) hypertension: Secondary | ICD-10-CM | POA: Diagnosis not present

## 2019-09-22 DIAGNOSIS — C61 Malignant neoplasm of prostate: Secondary | ICD-10-CM | POA: Diagnosis not present

## 2019-09-22 DIAGNOSIS — J300 Vasomotor rhinitis: Secondary | ICD-10-CM | POA: Diagnosis not present

## 2019-09-22 DIAGNOSIS — Z20828 Contact with and (suspected) exposure to other viral communicable diseases: Secondary | ICD-10-CM | POA: Diagnosis not present

## 2019-09-22 DIAGNOSIS — N5201 Erectile dysfunction due to arterial insufficiency: Secondary | ICD-10-CM | POA: Diagnosis not present

## 2019-09-29 DIAGNOSIS — Z20828 Contact with and (suspected) exposure to other viral communicable diseases: Secondary | ICD-10-CM | POA: Diagnosis not present

## 2019-09-29 DIAGNOSIS — J300 Vasomotor rhinitis: Secondary | ICD-10-CM | POA: Diagnosis not present

## 2019-10-20 DIAGNOSIS — J310 Chronic rhinitis: Secondary | ICD-10-CM | POA: Diagnosis not present

## 2019-10-20 DIAGNOSIS — Z20828 Contact with and (suspected) exposure to other viral communicable diseases: Secondary | ICD-10-CM | POA: Diagnosis not present

## 2019-10-27 DIAGNOSIS — Z20828 Contact with and (suspected) exposure to other viral communicable diseases: Secondary | ICD-10-CM | POA: Diagnosis not present

## 2019-10-27 DIAGNOSIS — J310 Chronic rhinitis: Secondary | ICD-10-CM | POA: Diagnosis not present

## 2020-01-03 ENCOUNTER — Telehealth: Payer: Self-pay

## 2020-01-03 NOTE — Telephone Encounter (Signed)
Tried to call pt but no answer. Need to ask pt  if he has had a DM Eye Exam in the last 12 months. If so, where , so we can request records. If not, would he like a referral.

## 2020-02-10 ENCOUNTER — Telehealth: Payer: Self-pay | Admitting: Family Medicine

## 2020-02-11 NOTE — Telephone Encounter (Signed)
Please call and schedule Diabetes follow up.  He was suppose to come back in June.

## 2020-02-17 NOTE — Telephone Encounter (Signed)
Appointment 8/17 Pt aware

## 2020-02-21 LAB — HM DIABETES FOOT EXAM

## 2020-02-29 ENCOUNTER — Encounter: Payer: Self-pay | Admitting: *Deleted

## 2020-02-29 ENCOUNTER — Ambulatory Visit (INDEPENDENT_AMBULATORY_CARE_PROVIDER_SITE_OTHER): Payer: Medicare HMO | Admitting: Family Medicine

## 2020-02-29 ENCOUNTER — Other Ambulatory Visit: Payer: Self-pay

## 2020-02-29 VITALS — BP 122/86 | HR 71 | Temp 95.5°F | Ht 66.0 in | Wt 197.0 lb

## 2020-02-29 DIAGNOSIS — I10 Essential (primary) hypertension: Secondary | ICD-10-CM | POA: Diagnosis not present

## 2020-02-29 DIAGNOSIS — Z0100 Encounter for examination of eyes and vision without abnormal findings: Secondary | ICD-10-CM

## 2020-02-29 DIAGNOSIS — E1159 Type 2 diabetes mellitus with other circulatory complications: Secondary | ICD-10-CM | POA: Diagnosis not present

## 2020-02-29 DIAGNOSIS — E785 Hyperlipidemia, unspecified: Secondary | ICD-10-CM

## 2020-02-29 DIAGNOSIS — E6609 Other obesity due to excess calories: Secondary | ICD-10-CM | POA: Insufficient documentation

## 2020-02-29 DIAGNOSIS — E119 Type 2 diabetes mellitus without complications: Secondary | ICD-10-CM | POA: Diagnosis not present

## 2020-02-29 DIAGNOSIS — Z6831 Body mass index (BMI) 31.0-31.9, adult: Secondary | ICD-10-CM | POA: Diagnosis not present

## 2020-02-29 DIAGNOSIS — E1169 Type 2 diabetes mellitus with other specified complication: Secondary | ICD-10-CM | POA: Diagnosis not present

## 2020-02-29 LAB — COMPREHENSIVE METABOLIC PANEL
ALT: 14 U/L (ref 0–53)
AST: 20 U/L (ref 0–37)
Albumin: 4.2 g/dL (ref 3.5–5.2)
Alkaline Phosphatase: 64 U/L (ref 39–117)
BUN: 12 mg/dL (ref 6–23)
CO2: 27 mEq/L (ref 19–32)
Calcium: 9.1 mg/dL (ref 8.4–10.5)
Chloride: 107 mEq/L (ref 96–112)
Creatinine, Ser: 1.23 mg/dL (ref 0.40–1.50)
GFR: 70.38 mL/min (ref >60.00)
Glucose, Bld: 97 mg/dL (ref 70–99)
Potassium: 4.3 mEq/L (ref 3.5–5.1)
Sodium: 139 mEq/L (ref 135–145)
Total Bilirubin: 0.6 mg/dL (ref 0.2–1.2)
Total Protein: 7.4 g/dL (ref 6.0–8.3)

## 2020-02-29 LAB — LIPID PANEL
Cholesterol: 145 mg/dL (ref 0–200)
HDL: 47.1 mg/dL (ref >39.00)
LDL Cholesterol: 89 mg/dL (ref 0–99)
NonHDL: 97.57
Total CHOL/HDL Ratio: 3
Triglycerides: 44 mg/dL (ref 0.0–149.0)
VLDL: 8.8 mg/dL (ref 0.0–40.0)

## 2020-02-29 LAB — HEMOGLOBIN A1C: Hgb A1c MFr Bld: 5.9 % (ref 4.6–6.5)

## 2020-02-29 NOTE — Patient Instructions (Addendum)
Set up yearly eye exam for diabetes and have the opthalmologist send [place] a copy of the evaluation for the chart.  Please stop at the lab to have labs drawn.

## 2020-02-29 NOTE — Assessment & Plan Note (Signed)
Encouraged exercise, weight loss, healthy eating habits. ? ?

## 2020-02-29 NOTE — Assessment & Plan Note (Signed)
Due for re-eval. Normal foot exam. Referred for diabetic eye exam.

## 2020-02-29 NOTE — Progress Notes (Signed)
Chief Complaint  Patient presents with  . Follow-up    History of Present Illness: HPI   69 year old male presents for follow up on DM  Diabetes: Due for re-eval.  Diet controled at last check. Lab Results  Component Value Date   HGBA1C 5.8 06/09/2019  Using medications without difficulties: Hypoglycemic episodes: Hyperglycemic episodes: Feet problems: no ulcers Blood Sugars averaging: not checking eye exam within last year: DUE  Hypertension:  At goal on losartan. BP Readings from Last 3 Encounters:  02/29/20 122/86  06/15/19 120/90  06/26/18 130/74  Using medication without problems or lightheadedness:  none Chest pain with exertion: none Edema:none Short of breath:none Average home BPs: Other issues:  Elevated Cholesterol:  On statin but at last check not at goal.. due for re-eval. Using medications without problems: Muscle aches:  none Diet compliance: moderate Exercise:  Walking 2-3 times a week. Other complaints:    This visit occurred during the SARS-CoV-2 public health emergency.  Safety protocols were in place, including screening questions prior to the visit, additional usage of staff PPE, and extensive cleaning of exam room while observing appropriate contact time as indicated for disinfecting solutions.   COVID 19 screen:  No recent travel or known exposure to COVID19 The patient denies respiratory symptoms of COVID 19 at this time. The importance of social distancing was discussed today.     ROS    Past Medical History:  Diagnosis Date  . Allergic rhinitis, cause unspecified   . Cancer [facility])    prostate cancer 2018  . Diabetes mellitus without complication [facility])    patient denies although dx in 2017 with a Dr Diona Browner ; last hgA1c was however 5.5   . Unspecified essential hypertension   . Unspecified sleep apnea    CPAP machine use     reports that he has quit smoking. He has never used smokeless tobacco. He reports that he does not drink  alcohol and does not use drugs.   Current Outpatient Medications:  .  atorvastatin (LIPITOR) 10 MG tablet, Take 1 tablet (10 mg total) by mouth daily., Disp: 30 tablet, Rfl: 11 .  losartan (COZAAR) 50 MG tablet, Take 1 tablet by mouth once daily, Disp: 90 tablet, Rfl: 0   Observations/Objective: Blood pressure 122/86, pulse 71, temperature (!) 95.5 F (35.3 C), temperature source Temporal, height 5\' 6"  (1.676 m), weight 197 lb (89.4 kg), SpO2 97 %.  Physical Exam Constitutional:      Appearance: He is well-developed. He is obese.  HENT:     Head: Normocephalic.     Right Ear: Hearing normal.     Left Ear: Hearing normal.     Nose: Nose normal.  Neck:     Thyroid: No thyroid mass or thyromegaly.     Vascular: No carotid bruit.     Trachea: Trachea normal.  Cardiovascular:     Rate and Rhythm: Normal rate and regular rhythm.     Pulses: Normal pulses.     Heart sounds: Heart sounds not distant. No murmur heard.  No friction rub. No gallop.      Comments: No peripheral edema Pulmonary:     Effort: Pulmonary effort is normal. No respiratory distress.     Breath sounds: Normal breath sounds.  Skin:    General: Skin is warm and dry.     Findings: No rash.  Psychiatric:        Speech: Speech normal.        Behavior: Behavior  normal.        Thought Content: Thought content normal.      Diabetic foot exam: Normal inspection No skin breakdown No calluses  Normal DP pulses Normal sensation to light touch and monofilament Nails normal  Assessment and Plan    Class 1 obesity due to excess calories with serious comorbidity and body mass index (BMI) of 31.0 to 31.9 in adult Encouraged exercise, weight loss, healthy eating habits.   Hypertension associated with diabetes ([place]) Well controlled. Continue current medication. On ARB.  Hyperlipidemia associated with type 2 diabetes mellitus ([place])  Due for re-eval on statin.  Type 2 diabetes mellitus with other circulatory  complications HTN (HCC) Due for re-eval. Normal foot exam. Referred for diabetic eye exam.    Eliezer Lofts, MD

## 2020-02-29 NOTE — Assessment & Plan Note (Signed)
Due for re-eval on statin 

## 2020-02-29 NOTE — Assessment & Plan Note (Signed)
Well controlled. Continue current medication. On ARB.

## 2020-04-19 DIAGNOSIS — N5201 Erectile dysfunction due to arterial insufficiency: Secondary | ICD-10-CM | POA: Diagnosis not present

## 2020-04-19 DIAGNOSIS — C61 Malignant neoplasm of prostate: Secondary | ICD-10-CM | POA: Diagnosis not present

## 2020-04-29 DIAGNOSIS — C61 Malignant neoplasm of prostate: Secondary | ICD-10-CM | POA: Diagnosis not present

## 2020-05-03 DIAGNOSIS — Z23 Encounter for immunization: Secondary | ICD-10-CM | POA: Diagnosis not present

## 2020-05-12 ENCOUNTER — Other Ambulatory Visit: Payer: Self-pay | Admitting: *Deleted

## 2020-05-12 MED ORDER — LOSARTAN POTASSIUM 50 MG PO TABS: 50.0000 mg | ORAL_TABLET | Freq: Every day | ORAL | 1 refills | Status: DC

## 2020-05-29 ENCOUNTER — Other Ambulatory Visit: Payer: Self-pay | Admitting: Family Medicine

## 2020-09-13 ENCOUNTER — Other Ambulatory Visit: Payer: Self-pay | Admitting: Family Medicine

## 2020-09-13 NOTE — Telephone Encounter (Signed)
Left message for patient to call the office

## 2020-09-13 NOTE — Telephone Encounter (Signed)
Please schedule MWV with nurse and CPE with Dr. Bedsole. 

## 2020-10-18 ENCOUNTER — Telehealth: Payer: Self-pay | Admitting: Family Medicine

## 2020-10-18 DIAGNOSIS — E119 Type 2 diabetes mellitus without complications: Secondary | ICD-10-CM

## 2020-10-18 NOTE — Telephone Encounter (Signed)
-----   Message from Ellamae Sia sent at 10/16/2020 10:52 AM EDT ----- Regarding: Lab orders for Tuesday, 4.5.22 Patient is scheduled for CPX labs, please order future labs, Thanks , Karna Christmas

## 2020-10-31 ENCOUNTER — Other Ambulatory Visit: Payer: Self-pay

## 2020-10-31 ENCOUNTER — Other Ambulatory Visit: Payer: Medicare HMO

## 2020-10-31 ENCOUNTER — Other Ambulatory Visit (INDEPENDENT_AMBULATORY_CARE_PROVIDER_SITE_OTHER): Payer: Medicare HMO

## 2020-10-31 DIAGNOSIS — E119 Type 2 diabetes mellitus without complications: Secondary | ICD-10-CM

## 2020-10-31 LAB — COMPREHENSIVE METABOLIC PANEL
ALT: 18 U/L (ref 0–53)
AST: 17 U/L (ref 0–37)
Albumin: 4 g/dL (ref 3.5–5.2)
Alkaline Phosphatase: 69 U/L (ref 39–117)
BUN: 13 mg/dL (ref 6–23)
CO2: 29 mEq/L (ref 19–32)
Calcium: 9.1 mg/dL (ref 8.4–10.5)
Chloride: 106 mEq/L (ref 96–112)
Creatinine, Ser: 1.23 mg/dL (ref 0.40–1.50)
GFR: 59.4 mL/min — ABNORMAL LOW (ref >60.00)
Glucose, Bld: 83 mg/dL (ref 70–99)
Potassium: 4.1 mEq/L (ref 3.5–5.1)
Sodium: 140 mEq/L (ref 135–145)
Total Bilirubin: 0.4 mg/dL (ref 0.2–1.2)
Total Protein: 7.5 g/dL (ref 6.0–8.3)

## 2020-10-31 LAB — LIPID PANEL
Cholesterol: 139 mg/dL (ref 0–200)
HDL: 48.1 mg/dL (ref >39.00)
LDL Cholesterol: 79 mg/dL (ref 0–99)
NonHDL: 90.86
Total CHOL/HDL Ratio: 3
Triglycerides: 57 mg/dL (ref 0.0–149.0)
VLDL: 11.4 mg/dL (ref 0.0–40.0)

## 2020-10-31 LAB — HEMOGLOBIN A1C: Hgb A1c MFr Bld: 6 % (ref 4.6–6.5)

## 2020-10-31 NOTE — Progress Notes (Signed)
No critical labs need to be addressed urgently. We will discuss labs in detail at upcoming office visit.   

## 2020-11-03 ENCOUNTER — Telehealth: Payer: Self-pay

## 2020-11-03 ENCOUNTER — Ambulatory Visit: Payer: Medicare HMO

## 2020-11-03 ENCOUNTER — Other Ambulatory Visit: Payer: Self-pay

## 2020-11-03 NOTE — Telephone Encounter (Signed)
Called patient 3 times trying to complete his AWV. Patient never answered the phone. Recording kept saying, " I'm sorry, the customer has a voicemail that is not setup yet, goodbye." Appointment cancelled.

## 2020-11-09 ENCOUNTER — Encounter: Payer: Self-pay | Admitting: Family Medicine

## 2020-11-09 ENCOUNTER — Ambulatory Visit (INDEPENDENT_AMBULATORY_CARE_PROVIDER_SITE_OTHER): Payer: Medicare HMO | Admitting: Family Medicine

## 2020-11-09 ENCOUNTER — Other Ambulatory Visit: Payer: Self-pay

## 2020-11-09 VITALS — BP 124/90 | HR 76 | Temp 97.7°F | Ht 66.0 in | Wt 202.8 lb

## 2020-11-09 DIAGNOSIS — E1169 Type 2 diabetes mellitus with other specified complication: Secondary | ICD-10-CM

## 2020-11-09 DIAGNOSIS — Z0100 Encounter for examination of eyes and vision without abnormal findings: Secondary | ICD-10-CM

## 2020-11-09 DIAGNOSIS — E785 Hyperlipidemia, unspecified: Secondary | ICD-10-CM

## 2020-11-09 DIAGNOSIS — Z0000 Encounter for general adult medical examination without abnormal findings: Secondary | ICD-10-CM | POA: Diagnosis not present

## 2020-11-09 DIAGNOSIS — I152 Hypertension secondary to endocrine disorders: Secondary | ICD-10-CM | POA: Diagnosis not present

## 2020-11-09 DIAGNOSIS — E119 Type 2 diabetes mellitus without complications: Secondary | ICD-10-CM

## 2020-11-09 DIAGNOSIS — Z6831 Body mass index (BMI) 31.0-31.9, adult: Secondary | ICD-10-CM

## 2020-11-09 DIAGNOSIS — E6609 Other obesity due to excess calories: Secondary | ICD-10-CM | POA: Diagnosis not present

## 2020-11-09 DIAGNOSIS — E1159 Type 2 diabetes mellitus with other circulatory complications: Secondary | ICD-10-CM

## 2020-11-09 DIAGNOSIS — Z8546 Personal history of malignant neoplasm of prostate: Secondary | ICD-10-CM | POA: Diagnosis not present

## 2020-11-09 DIAGNOSIS — G4733 Obstructive sleep apnea (adult) (pediatric): Secondary | ICD-10-CM | POA: Diagnosis not present

## 2020-11-09 NOTE — Assessment & Plan Note (Signed)
Stable, chronic. Diet controlled.   

## 2020-11-09 NOTE — Progress Notes (Signed)
Patient ID: Chase Moreno, male    DOB: [birth year], 70 y.o.   MRN: [id number]  This visit was conducted in person.  Pulse 76   Temp 97.7 F (36.5 C) (Temporal)   Ht 5\' 6"  (1.676 m)   Wt 202 lb 12 oz (92 kg)   SpO2 98%   BMI 32.72 kg/m    CC:  Chief Complaint  Patient presents with  . Medicare Wellness    Subjective:   HPI: Chase Moreno is a 70 y.o. male presenting on 11/09/2020 for Medicare Wellness  The patient presents for annual medicare wellness, complete physical and review of chronic health problems. He/She also has the following acute concerns today: none  I have personally reviewed the Medicare Annual Wellness questionnaire and have noted 1. The patient's medical and social history 2. Their use of alcohol, tobacco or illicit drugs 3. Their current medications and supplements 4. The patient's functional ability including ADL's, fall risks, home safety risks and hearing or visual             impairment. 5. Diet and physical activities 6. Evidence for depression or mood disorders 7.         Updated provider list Cognitive evaluation was performed and recorded on pt medicare questionnaire form. The patients weight, height, BMI and visual acuity have been recorded in the chart  I have made referrals, counseling and provided education to the patient based review of the above and I have provided the pt with a written personalized care plan for preventive services.   Documentation of this information was scanned into the electronic record under the media tab.   Advance directives and end of life planning reviewed in detail with patient and documented in EMR. Patient given handout on advance care directives if needed. HCPOA and living will updated if needed.  Fall Risk  11/09/2020 06/08/2019 05/29/2018 05/27/2017 11/28/2015  Falls in the past year? 0 0 0 No No  Number falls in past yr: - 0 - - -  Injury with Fall? - 0 - - -  Risk for fall due to : - Medication side effect  - - -  Follow up - Falls evaluation completed;Falls prevention discussed - - -    Flowsheet Row Office Visit from 11/09/2020 in [place] at [facility] Total Score 0       Hearing Screening   Method: Audiometry   125Hz  250Hz  500Hz  1000Hz  2000Hz  3000Hz  4000Hz  6000Hz  8000Hz   Right ear:   20 0 20  0    Left ear:   20 0 20  0      Visual Acuity Screening   Right eye Left eye Both eyes  Without correction: 20/25 20/25 20/20   With correction:        Diabetes:   Stable control  with diet. Lab Results  Component Value Date   HGBA1C 6.0 10/31/2020  Using medications without difficulties: Hypoglycemic episodes: Hyperglycemic episodes: Feet problems: no ulcers Blood Sugars averaging: not  chekcing eye exam within last year: Overdue  Hypertension:    Borderline diastolic BP today in office.. on losartan 50 mg daily. BP Readings from Last 3 Encounters:  11/09/20 124/90  02/29/20 122/86  06/15/19 120/90  Using medication without problems or lightheadedness:  none Chest pain with exertion:none Edema:none Short of breath:none Average home BPs: Other issues:  Elevated Cholesterol:  LDL at goal < 100 on  atorvastatin 10 mg daily. Lab Results  Component Value Date  CHOL 139 10/31/2020   HDL 48.10 10/31/2020   LDLCALC 79 10/31/2020   LDLDIRECT 169.8 02/24/2012   TRIG 57.0 10/31/2020   CHOLHDL 3 10/31/2020  Using medications without problems: none Muscle aches:  none Diet compliance: good Exercise: Other complaints:  Obesity causing OSA and DM.. stable on CPAp Body mass index is 32.72 kg/m.  Hx of prostate cancer: S/P prostatectomy followed by Urology, Dr. Alinda Money.      Relevant past medical, surgical, family and social history reviewed and updated as indicated. Interim medical history since our last visit reviewed. Allergies and medications reviewed and updated. Outpatient Medications Prior to Visit  Medication Sig Dispense Refill  . atorvastatin  (LIPITOR) 10 MG tablet Take 1 tablet by mouth once daily 90 tablet 2  . losartan (COZAAR) 50 MG tablet Take 1 tablet by mouth once daily 90 tablet 0   No facility-administered medications prior to visit.     Per HPI unless specifically indicated in ROS section below Review of Systems  Constitutional: Negative for fatigue and fever.  HENT: Negative for ear pain.   Eyes: Negative for pain.  Respiratory: Negative for cough and shortness of breath.   Cardiovascular: Negative for chest pain, palpitations and leg swelling.  Gastrointestinal: Negative for abdominal pain.  Genitourinary: Negative for dysuria.  Musculoskeletal: Negative for arthralgias.  Neurological: Negative for syncope, light-headedness and headaches.  Psychiatric/Behavioral: Negative for dysphoric mood.   Objective:  Pulse 76   Temp 97.7 F (36.5 C) (Temporal)   Ht 5\' 6"  (1.676 m)   Wt 202 lb 12 oz (92 kg)   SpO2 98%   BMI 32.72 kg/m   Wt Readings from Last 3 Encounters:  11/09/20 202 lb 12 oz (92 kg)  02/29/20 197 lb (89.4 kg)  06/15/19 199 lb (90.3 kg)      Physical Exam Constitutional:      Appearance: He is well-developed.  HENT:     Head: Normocephalic.     Right Ear: Hearing normal.     Left Ear: Hearing normal.     Nose: Nose normal.  Neck:     Thyroid: No thyroid mass or thyromegaly.     Vascular: No carotid bruit.     Trachea: Trachea normal.  Cardiovascular:     Rate and Rhythm: Normal rate and regular rhythm.     Pulses: Normal pulses.     Heart sounds: Heart sounds not distant. No murmur heard. No friction rub. No gallop.      Comments: No peripheral edema Pulmonary:     Effort: Pulmonary effort is normal. No respiratory distress.     Breath sounds: Normal breath sounds.  Skin:    General: Skin is warm and dry.     Findings: No rash.  Psychiatric:        Speech: Speech normal.        Behavior: Behavior normal.        Thought Content: Thought content normal.       Results for  orders placed or performed in visit on 10/31/20  Comprehensive metabolic panel  Result Value Ref Range   Sodium 140 135 - 145 mEq/L   Potassium 4.1 3.5 - 5.1 mEq/L   Chloride 106 96 - 112 mEq/L   CO2 29 19 - 32 mEq/L   Glucose, Bld 83 70 - 99 mg/dL   BUN 13 6 - 23 mg/dL   Creatinine, Ser 1.23 0.40 - 1.50 mg/dL   Total Bilirubin 0.4 0.2 - 1.2 mg/dL  Alkaline Phosphatase 69 39 - 117 U/L   AST 17 0 - 37 U/L   ALT 18 0 - 53 U/L   Total Protein 7.5 6.0 - 8.3 g/dL   Albumin 4.0 3.5 - 5.2 g/dL   GFR 59.40 (L) >60.00 mL/min   Calcium 9.1 8.4 - 10.5 mg/dL  Lipid panel  Result Value Ref Range   Cholesterol 139 0 - 200 mg/dL   Triglycerides 57.0 0.0 - 149.0 mg/dL   HDL 48.10 >39.00 mg/dL   VLDL 11.4 0.0 - 40.0 mg/dL   LDL Cholesterol 79 0 - 99 mg/dL   Total CHOL/HDL Ratio 3    NonHDL 90.86   Hemoglobin A1c  Result Value Ref Range   Hgb A1c MFr Bld 6.0 4.6 - 6.5 %    This visit occurred during the SARS-CoV-2 public health emergency.  Safety protocols were in place, including screening questions prior to the visit, additional usage of staff PPE, and extensive cleaning of exam room while observing appropriate contact time as indicated for disinfecting solutions.   COVID 19 screen:  No recent travel or known exposure to COVID19 The patient denies respiratory symptoms of COVID 19 at this time. The importance of social distancing was discussed today.   Assessment and Plan The patient's preventative maintenance and recommended screening tests for an annual wellness exam were reviewed in full today. Brought up to date unless services declined.  Counselled on the importance of diet, exercise, and its role in overall health and mortality. The patient's FH and SH was reviewed, including their home life, tobacco status, and drug and alcohol status.   Vaccines: uptodateflu, PNAdue for td and newshingles vaccine .  Discussed COVID19 vaccine side effects and benefits. Strongly encouraged the  patient to get the vaccine. Questions answered... discuss booster. Colon: 10/09/2007 Dr. Fuller Plan, polyps, Cologuard 2020 negative... repeat in 3 years Hep C:neg nonsmoker Prostate:02/2017 S/p Robotic assisted prostatectomyfollowed byDr. Alinda Money.  Yearly visit ETOH: none   Problem List Items Addressed This Visit    Class 1 obesity due to excess calories with serious comorbidity and body mass index (BMI) of 31.0 to 31.9 in adult   Diabetic eye exam [facility])   Relevant Orders   Ambulatory referral to Ophthalmology   History of prostate cancer     Followed by Urology Dr. Alinda Money. S/p prostatectomy      Hyperlipidemia associated with type 2 diabetes mellitus (HCC)    Stable, chronic.  Continue current medication.  Atorvastatin 10 mg daily       Hypertension associated with diabetes ([place])    Borderline control in office today.. follow BPs at home on losartan 50 mg daily. If > 140/90 regularly.. call for medication adjustment.      OSA (obstructive sleep apnea)    Followed by pulmonary stable on CPAP.      Type 2 diabetes mellitus with other circulatory complications HTN (HCC)    Stable, chronic.   Diet controlled.       Other Visit Diagnoses    Medicare annual wellness visit, subsequent    -  Primary      Orders Placed This Encounter  Procedures  . Ambulatory referral to Ophthalmology    Referral Priority:   Routine    Referral Type:   Consultation    Referral Reason:   Specialty Services Required    Requested Specialty:   Ophthalmology    Number of Visits Requested:   1     Eliezer Lofts, MD

## 2020-11-09 NOTE — Assessment & Plan Note (Signed)
Followed by pulmonary stable on CPAP.

## 2020-11-09 NOTE — Assessment & Plan Note (Signed)
Borderline control in office today.. follow BPs at home on losartan 50 mg daily. If > 140/90 regularly.. call for medication adjustment.

## 2020-11-09 NOTE — Assessment & Plan Note (Signed)
Followed by Urology Dr. Alinda Money. S/p prostatectomy

## 2020-11-09 NOTE — Patient Instructions (Addendum)
They will call you  for yearly eye exam for diabetes and have the opthalmologist send [place] a copy of the evaluation for the chart. Follow BPs at home on losartan 50 mg daily. If > 140/90 regularly.. call for medication adjustment.    Preventive Care 71 Years and Older, Male Preventive care refers to lifestyle choices and visits with your health care provider that can promote health and wellness. This includes:  A yearly physical exam. This is also called an annual wellness visit.  Regular dental and eye exams.  Immunizations.  Screening for certain conditions.  Healthy lifestyle choices, such as: ? Eating a healthy diet. ? Getting regular exercise. ? Not using drugs or products that contain nicotine and tobacco. ? Limiting alcohol use. What can I expect for my preventive care visit? Physical exam Your health care provider will check your:  Height and weight. These may be used to calculate your BMI (body mass index). BMI is a measurement that tells if you are at a healthy weight.  Heart rate and blood pressure.  Body temperature.  Skin for abnormal spots. Counseling Your health care provider may ask you questions about your:  Past medical problems.  Family's medical history.  Alcohol, tobacco, and drug use.  Emotional well-being.  Home life and relationship well-being.  Sexual activity.  Diet, exercise, and sleep habits.  History of falls.  Memory and ability to understand (cognition).  Work and work Statistician.  Access to firearms. What immunizations do I need? Vaccines are usually given at various ages, according to a schedule. Your health care provider will recommend vaccines for you based on your age, medical history, and lifestyle or other factors, such as travel or where you work.   What tests do I need? Blood tests  Lipid and cholesterol levels. These may be checked every 5 years, or more often depending on your overall health.  Hepatitis C  test.  Hepatitis B test. Screening  Lung cancer screening. You may have this screening every year starting at age 43 if you have a 30-pack-year history of smoking and currently smoke or have quit within the past 15 years.  Colorectal cancer screening. ? All adults should have this screening starting at age 9 and continuing until age 82. ? Your health care provider may recommend screening at age 48 if you are at increased risk. ? You will have tests every 1-10 years, depending on your results and the type of screening test.  Prostate cancer screening. Recommendations will vary depending on your family history and other risks.  Genital exam to check for testicular cancer or hernias.  Diabetes screening. ? This is done by checking your blood sugar (glucose) after you have not eaten for a while (fasting). ? You may have this done every 1-3 years.  Abdominal aortic aneurysm (AAA) screening. You may need this if you are a current or former smoker.  STD (sexually transmitted disease) testing, if you are at risk. Follow these instructions at home: Eating and drinking  Eat a diet that includes fresh fruits and vegetables, whole grains, lean protein, and low-fat dairy products. Limit your intake of foods with high amounts of sugar, saturated fats, and salt.  Take vitamin and mineral supplements as recommended by your health care provider.  Do not drink alcohol if your health care provider tells you not to drink.  If you drink alcohol: ? Limit how much you have to 0-2 drinks a day. ? Be aware of how much alcohol is  in your drink. In the [place] one drink equals one 12 oz bottle of beer (355 mL), one 5 oz glass of wine (148 mL), or one 1 oz glass of hard liquor (44 mL).   Lifestyle  Take daily care of your teeth and gums. Brush your teeth every morning and night with fluoride toothpaste. Floss one time each day.  Stay active. Exercise for at least 30 minutes 5 or more days each week.  Do  not use any products that contain nicotine or tobacco, such as cigarettes, e-cigarettes, and chewing tobacco. If you need help quitting, ask your health care provider.  Do not use drugs.  If you are sexually active, practice safe sex. Use a condom or other form of protection to prevent STIs (sexually transmitted infections).  Talk with your health care provider about taking a low-dose aspirin or statin.  Find healthy ways to cope with stress, such as: ? Meditation, yoga, or listening to music. ? Journaling. ? Talking to a trusted person. ? Spending time with friends and family. Safety  Always wear your seat belt while driving or riding in a vehicle.  Do not drive: ? If you have been drinking alcohol. Do not ride with someone who has been drinking. ? When you are tired or distracted. ? While texting.  Wear a helmet and other protective equipment during sports activities.  If you have firearms in your house, make sure you follow all gun safety procedures. What's next?  Visit your health care provider once a year for an annual wellness visit.  Ask your health care provider how often you should have your eyes and teeth checked.  Stay up to date on all vaccines. This information is not intended to replace advice given to you by your health care provider. Make sure you discuss any questions you have with your health care provider. Document Revised: 03/30/2019 Document Reviewed: 06/25/2018 Elsevier Patient Education  2021 [employer].

## 2020-11-09 NOTE — Assessment & Plan Note (Signed)
Stable, chronic.  Continue current medication.   Atorvastatin 10 mg daily 

## 2020-11-10 DIAGNOSIS — N5201 Erectile dysfunction due to arterial insufficiency: Secondary | ICD-10-CM | POA: Diagnosis not present

## 2020-11-10 DIAGNOSIS — C61 Malignant neoplasm of prostate: Secondary | ICD-10-CM | POA: Diagnosis not present

## 2020-12-13 ENCOUNTER — Other Ambulatory Visit: Payer: Self-pay | Admitting: Family Medicine

## 2021-02-01 ENCOUNTER — Telehealth: Payer: Self-pay

## 2021-02-01 NOTE — Telephone Encounter (Signed)
Pts wife (DPR signed) said pt tested + covid on 01/31/21; pt had symptoms first on 01/30/21 with stiffness in his joints, prod cough with grey colored phlegm; and sinus congestion.pt has an inhaler but has not needed to use inhaler.No CP and no SOB.pt has no fever and no other covid symptoms other than the ones listed in this note. Pts wife said pt is not having any wheezing and is in no distress with his breathing. No available appts today at [facility] and scheduled video visit with Dr Darnell Level on 02/02/21 at 10;00 AM. Pts wife will have vitals ready in AM; UC & ED precautions given and pt and wife voiced understanding and advised pt and his wife pt should self quarantine,drink plenty of fluids, rest and tylenol if spikes a fever. Both pt and his wife voiced understanding. Sending note to Dr Darnell Level; DR Diona Browner as PCP and will send teams to Dr Darnell Level and Dr Diona Browner as well.

## 2021-02-01 NOTE — Telephone Encounter (Signed)
Noted. Thanks you for seeing patient

## 2021-02-02 ENCOUNTER — Encounter: Payer: Self-pay | Admitting: Family Medicine

## 2021-02-02 ENCOUNTER — Telehealth: Payer: Self-pay

## 2021-02-02 ENCOUNTER — Telehealth (INDEPENDENT_AMBULATORY_CARE_PROVIDER_SITE_OTHER): Payer: Medicare HMO | Admitting: Family Medicine

## 2021-02-02 VITALS — BP 142/96 | HR 82 | Temp 98.5°F | Ht 66.0 in | Wt 200.0 lb

## 2021-02-02 DIAGNOSIS — Z6831 Body mass index (BMI) 31.0-31.9, adult: Secondary | ICD-10-CM | POA: Diagnosis not present

## 2021-02-02 DIAGNOSIS — U071 COVID-19: Secondary | ICD-10-CM

## 2021-02-02 DIAGNOSIS — I152 Hypertension secondary to endocrine disorders: Secondary | ICD-10-CM | POA: Diagnosis not present

## 2021-02-02 DIAGNOSIS — E1159 Type 2 diabetes mellitus with other circulatory complications: Secondary | ICD-10-CM

## 2021-02-02 DIAGNOSIS — E6609 Other obesity due to excess calories: Secondary | ICD-10-CM | POA: Diagnosis not present

## 2021-02-02 MED ORDER — NIRMATRELVIR/RITONAVIR (PAXLOVID) TABLET (RENAL DOSING): 2.0000 | ORAL_TABLET | Freq: Two times a day (BID) | ORAL | 0 refills | Status: AC

## 2021-02-02 NOTE — Telephone Encounter (Signed)
Pt has 10:00 MyChart video visit today with Dr. Darnell Level.  Need to get pt ready for appt.   Attempted to contact pt.  No answer.  No vm.

## 2021-02-02 NOTE — Assessment & Plan Note (Signed)
Recommend: Renally dosed paxlovid (GFR 59) Drug interactions:  Atorvastatin - rec hold while on paxlovid.  Reviewed currently approved EUA treatments.  Reviewed expected course of illness, anticipated course of recovery, as well as red flags to suggested COVID pneumonia or to seek urgent in-person care. Reviewed latest CDC isolation/quarantining guidelines.  Encouraged fluids and rest. Reviewed further supportive care measures at home including vit C, vit D, zinc, tylenol PRN.

## 2021-02-02 NOTE — Progress Notes (Signed)
Patient ID: Chase Moreno, male    DOB: [birth year], 70 y.o.   MRN: [id number]  Virtual visit attempted through [place], a video enabled telemedicine application. Due to national recommendations of social distancing due to COVID-19, a virtual visit is felt to be most appropriate for this patient at this time. Reviewed limitations, risks, security and privacy concerns of performing a virtual visit and the availability of in person appointments. I also reviewed that there may be a patient responsible charge related to this service. The patient agreed to proceed.   Interactive audio and video telecommunications were attempted between myself and [place], however failed due to patient having technical difficulties OR patient not having access to video capability.  We continued and completed visit with audio only.  Time: 10:09am - 10:21am   Patient location: home Provider location: [place] at [facility], office Persons participating in this virtual visit: patient, provider, wife Vaughan Basta also present  If any vitals were documented, they were collected by patient at home unless specified below.    BP (!) 142/96   Pulse 82   Temp 98.5 F (36.9 C)   Ht '5\' 6"'$  (1.676 m)   Wt 200 lb (90.7 kg)   BMI 32.28 kg/m    CC: COVID infection Subjective:   HPI: Chase Moreno is a 70 y.o. male presenting on 02/02/2021 for Generalized Body Aches (C/o body aches, prod cough- with gray phlegm and sinus congestion.  C/o pain in chest and face when coughing.  Sxs started 01/30/21.  Pos home COVID test on 01/31/21.  Has had 1 Moderna booster, not sure of date.)   First day of symptoms: 01/30/2021 Tested COVID positive: 01/31/2021  Current symptoms: body aches, mostly dry cough sometimes productive of gray phlegm, sinus congestion, throat and chest pain with cough  No: fevers/chills, abd pain, nausea, diarrhea, loss of taste/smell, dyspnea or wheezing.  Treatments to date: vit C, D.  Risk factors include: diabetes,  hypertension, age   COVID vaccination status: Moderna x2, moderna booster x1       Relevant past medical, surgical, family and social history reviewed and updated as indicated. Interim medical history since our last visit reviewed. Allergies and medications reviewed and updated. Outpatient Medications Prior to Visit  Medication Sig Dispense Refill   atorvastatin (LIPITOR) 10 MG tablet Take 1 tablet by mouth once daily 90 tablet 2   losartan (COZAAR) 50 MG tablet Take 1 tablet by mouth once daily 90 tablet 1   No facility-administered medications prior to visit.     Per HPI unless specifically indicated in ROS section below Review of Systems Objective:  BP (!) 142/96   Pulse 82   Temp 98.5 F (36.9 C)   Ht '5\' 6"'$  (1.676 m)   Wt 200 lb (90.7 kg)   BMI 32.28 kg/m   Wt Readings from Last 3 Encounters:  02/02/21 200 lb (90.7 kg)  11/09/20 202 lb 12 oz (92 kg)  02/29/20 197 lb (89.4 kg)       Physical exam: Gen: alert, NAD, not ill appearing Pulm: speaks in complete sentences without increased work of breathing Psych: normal mood, normal thought content      Results for orders placed or performed in visit on 10/31/20  Comprehensive metabolic panel  Result Value Ref Range   Sodium 140 135 - 145 mEq/L   Potassium 4.1 3.5 - 5.1 mEq/L   Chloride 106 96 - 112 mEq/L   CO2 29 19 - 32 mEq/L  Glucose, Bld 83 70 - 99 mg/dL   BUN 13 6 - 23 mg/dL   Creatinine, Ser 1.23 0.40 - 1.50 mg/dL   Total Bilirubin 0.4 0.2 - 1.2 mg/dL   Alkaline Phosphatase 69 39 - 117 U/L   AST 17 0 - 37 U/L   ALT 18 0 - 53 U/L   Total Protein 7.5 6.0 - 8.3 g/dL   Albumin 4.0 3.5 - 5.2 g/dL   GFR 59.40 (L) >60.00 mL/min   Calcium 9.1 8.4 - 10.5 mg/dL  Lipid panel  Result Value Ref Range   Cholesterol 139 0 - 200 mg/dL   Triglycerides 57.0 0.0 - 149.0 mg/dL   HDL 48.10 >39.00 mg/dL   VLDL 11.4 0.0 - 40.0 mg/dL   LDL Cholesterol 79 0 - 99 mg/dL   Total CHOL/HDL Ratio 3    NonHDL 90.86    Hemoglobin A1c  Result Value Ref Range   Hgb A1c MFr Bld 6.0 4.6 - 6.5 %   Assessment & Plan:   Problem List Items Addressed This Visit     Hypertension associated with diabetes ([place])   Type 2 diabetes mellitus with other circulatory complications HTN (HCC)   Class 1 obesity due to excess calories with serious comorbidity and body mass index (BMI) of 31.0 to 31.9 in adult   COVID-19 virus infection - Primary    Recommend: Renally dosed paxlovid (GFR 59) Drug interactions:  Atorvastatin - rec hold while on paxlovid.  Reviewed currently approved EUA treatments.  Reviewed expected course of illness, anticipated course of recovery, as well as red flags to suggested COVID pneumonia or to seek urgent in-person care. Reviewed latest CDC isolation/quarantining guidelines.  Encouraged fluids and rest. Reviewed further supportive care measures at home including vit C, vit D, zinc, tylenol PRN.         Relevant Medications   nirmatrelvir/ritonavir EUA, renal dosing, (PAXLOVID) TABS     Meds ordered this encounter  Medications   nirmatrelvir/ritonavir EUA, renal dosing, (PAXLOVID) TABS    Sig: Take 2 tablets by mouth 2 (two) times daily for 5 days. (Take nirmatrelvir 150 mg one tablet twice daily for 5 days and ritonavir 100 mg one tablet twice daily for 5 days) Patient GFR is 59    Dispense:  20 tablet    Refill:  0   No orders of the defined types were placed in this encounter.   I discussed the assessment and treatment plan with the patient. The patient was provided an opportunity to ask questions and all were answered. The patient agreed with the plan and demonstrated an understanding of the instructions. The patient was advised to call back or seek an in-person evaluation if the symptoms worsen or if the condition fails to improve as anticipated.  Follow up plan: No follow-ups on file.  Ria Bush, MD

## 2021-02-02 NOTE — Telephone Encounter (Signed)
Pt completed virtual visit with Dr. Darnell Level.

## 2021-03-04 ENCOUNTER — Other Ambulatory Visit: Payer: Self-pay | Admitting: Family Medicine

## 2021-03-31 ENCOUNTER — Encounter: Payer: Self-pay | Admitting: Family Medicine

## 2021-03-31 DIAGNOSIS — Z8546 Personal history of malignant neoplasm of prostate: Secondary | ICD-10-CM | POA: Diagnosis not present

## 2021-03-31 DIAGNOSIS — Z809 Family history of malignant neoplasm, unspecified: Secondary | ICD-10-CM | POA: Diagnosis not present

## 2021-03-31 DIAGNOSIS — Z8249 Family history of ischemic heart disease and other diseases of the circulatory system: Secondary | ICD-10-CM | POA: Diagnosis not present

## 2021-03-31 DIAGNOSIS — G4733 Obstructive sleep apnea (adult) (pediatric): Secondary | ICD-10-CM | POA: Diagnosis not present

## 2021-03-31 DIAGNOSIS — Z6831 Body mass index (BMI) 31.0-31.9, adult: Secondary | ICD-10-CM | POA: Diagnosis not present

## 2021-03-31 DIAGNOSIS — Z7982 Long term (current) use of aspirin: Secondary | ICD-10-CM | POA: Diagnosis not present

## 2021-03-31 DIAGNOSIS — E669 Obesity, unspecified: Secondary | ICD-10-CM | POA: Diagnosis not present

## 2021-03-31 DIAGNOSIS — Z87891 Personal history of nicotine dependence: Secondary | ICD-10-CM | POA: Diagnosis not present

## 2021-03-31 DIAGNOSIS — N529 Male erectile dysfunction, unspecified: Secondary | ICD-10-CM | POA: Diagnosis not present

## 2021-03-31 DIAGNOSIS — I10 Essential (primary) hypertension: Secondary | ICD-10-CM | POA: Diagnosis not present

## 2021-03-31 DIAGNOSIS — Z008 Encounter for other general examination: Secondary | ICD-10-CM | POA: Diagnosis not present

## 2021-03-31 DIAGNOSIS — E785 Hyperlipidemia, unspecified: Secondary | ICD-10-CM | POA: Diagnosis not present

## 2021-03-31 LAB — HM DIABETES EYE EXAM

## 2021-04-09 ENCOUNTER — Telehealth: Payer: Self-pay | Admitting: Family Medicine

## 2021-04-09 MED ORDER — LOSARTAN POTASSIUM 50 MG PO TABS: 50.0000 mg | ORAL_TABLET | Freq: Every day | ORAL | 1 refills | Status: DC

## 2021-04-09 NOTE — Telephone Encounter (Signed)
Refills sent as requested

## 2021-04-09 NOTE — Telephone Encounter (Signed)
  Encourage patient to contact the pharmacy for refills or they can request refills through [place]:  Please schedule appointment if longer than 1 year  NEXT APPOINTMENT DATE:05/11/21  MEDICATION:losartan (COZAAR) 50 MG tablet  Is the patient out of medication? Yes...  [place], [place] - [address]  Let patient know to contact pharmacy at the end of the day to make sure medication is ready.  Please notify patient to allow 48-72 hours to process  CLINICAL FILLS OUT ALL BELOW:   LAST REFILL:  QTY:  REFILL DATE:    OTHER COMMENTS:    Okay for refill?  Please advise

## 2021-04-18 ENCOUNTER — Telehealth: Payer: Self-pay | Admitting: Family Medicine

## 2021-04-18 DIAGNOSIS — E1159 Type 2 diabetes mellitus with other circulatory complications: Secondary | ICD-10-CM

## 2021-04-18 NOTE — Telephone Encounter (Signed)
-----   Message from Ellamae Sia sent at 04/11/2021  3:44 PM EDT ----- Regarding: Lab orders for Friday, 10.21.22 Patient is scheduled for CPX labs, please order future labs, Thanks , Karna Christmas

## 2021-05-04 ENCOUNTER — Other Ambulatory Visit: Payer: Medicare HMO

## 2021-05-07 ENCOUNTER — Other Ambulatory Visit: Payer: Medicare HMO

## 2021-05-11 ENCOUNTER — Ambulatory Visit (INDEPENDENT_AMBULATORY_CARE_PROVIDER_SITE_OTHER): Payer: Medicare HMO | Admitting: Family Medicine

## 2021-05-11 ENCOUNTER — Encounter: Payer: Self-pay | Admitting: Family Medicine

## 2021-05-11 ENCOUNTER — Other Ambulatory Visit: Payer: Self-pay | Admitting: Family Medicine

## 2021-05-11 ENCOUNTER — Other Ambulatory Visit: Payer: Self-pay

## 2021-05-11 VITALS — BP 122/78 | HR 70 | Temp 97.2°F | Ht 66.0 in | Wt 201.0 lb

## 2021-05-11 DIAGNOSIS — E785 Hyperlipidemia, unspecified: Secondary | ICD-10-CM

## 2021-05-11 DIAGNOSIS — Z23 Encounter for immunization: Secondary | ICD-10-CM | POA: Diagnosis not present

## 2021-05-11 DIAGNOSIS — I152 Hypertension secondary to endocrine disorders: Secondary | ICD-10-CM

## 2021-05-11 DIAGNOSIS — E1169 Type 2 diabetes mellitus with other specified complication: Secondary | ICD-10-CM | POA: Diagnosis not present

## 2021-05-11 DIAGNOSIS — Z8546 Personal history of malignant neoplasm of prostate: Secondary | ICD-10-CM

## 2021-05-11 DIAGNOSIS — E1159 Type 2 diabetes mellitus with other circulatory complications: Secondary | ICD-10-CM

## 2021-05-11 LAB — COMPREHENSIVE METABOLIC PANEL
ALT: 12 U/L (ref 0–53)
AST: 20 U/L (ref 0–37)
Albumin: 4.2 g/dL (ref 3.5–5.2)
Alkaline Phosphatase: 60 U/L (ref 39–117)
BUN: 14 mg/dL (ref 6–23)
CO2: 27 mEq/L (ref 19–32)
Calcium: 9 mg/dL (ref 8.4–10.5)
Chloride: 108 mEq/L (ref 96–112)
Creatinine, Ser: 1.26 mg/dL (ref 0.40–1.50)
GFR: 57.49 mL/min — ABNORMAL LOW (ref >60.00)
Glucose, Bld: 99 mg/dL (ref 70–99)
Potassium: 4.4 mEq/L (ref 3.5–5.1)
Sodium: 140 mEq/L (ref 135–145)
Total Bilirubin: 0.3 mg/dL (ref 0.2–1.2)
Total Protein: 7.7 g/dL (ref 6.0–8.3)

## 2021-05-11 LAB — LIPID PANEL
Cholesterol: 128 mg/dL (ref 0–200)
HDL: 38.1 mg/dL — ABNORMAL LOW (ref >39.00)
LDL Cholesterol: 78 mg/dL (ref 0–99)
NonHDL: 90.11
Total CHOL/HDL Ratio: 3
Triglycerides: 62 mg/dL (ref 0.0–149.0)
VLDL: 12.4 mg/dL (ref 0.0–40.0)

## 2021-05-11 LAB — HM DIABETES FOOT EXAM

## 2021-05-11 LAB — HEMOGLOBIN A1C: Hgb A1c MFr Bld: 5.8 % (ref 4.6–6.5)

## 2021-05-11 LAB — PSA, MEDICARE: PSA: 0.01 ng/ml — ABNORMAL LOW (ref 0.10–4.00)

## 2021-05-11 NOTE — Patient Instructions (Signed)
Please stop at the lab to have labs drawn.  We will call with lab results.

## 2021-05-11 NOTE — Assessment & Plan Note (Signed)
Stable, chronic.  Continue current medication.  losartan 50 mg daily   

## 2021-05-11 NOTE — Addendum Note (Signed)
Addended by: Dalia Heading R on: 05/11/2021 09:50 AM   Modules accepted: Orders

## 2021-05-11 NOTE — Assessment & Plan Note (Signed)
Previously well controlled with diet. A1C pending.

## 2021-05-11 NOTE — Progress Notes (Signed)
Patient ID: Chase Moreno, male    DOB: 29-Sep-1950, 70 y.o.   MRN: [id number]  This visit was conducted in person.  BP 122/78   Pulse 70   Temp (!) 97.2 F (36.2 C) (Temporal)   Ht 5\' 6"  (1.676 m)   Wt 201 lb (91.2 kg)   SpO2 98%   BMI 32.44 kg/m    CC Subjective:   HPI: Chase Moreno is a 70 y.o. male presenting on 05/11/2021 for Diabetes For 6 month follow up.  Diabetes: Lab eval pending Using medications without difficulties: Hypoglycemic episodes: Hyperglycemic episodes: Feet problems: Blood Sugars averaging: eye exam within last year: he states he has not had.  Hypertension:   Good control on losartan 50 mg daily BP Readings from Last 3 Encounters:  05/11/21 122/78  02/02/21 (!) 142/96  11/09/20 124/90  Using medication without problems or lightheadedness:  none Chest pain with exertion: none Edema:none Short of breath:none Average home BPs: Other issues:  Elevated Cholesterol:  Lab eval pending.. previously well controlled on atorvastatin 10 mg daily Using medications without problems: Muscle aches:  Diet compliance: Exercise: Other complaints:        Relevant past medical, surgical, family and social history reviewed and updated as indicated. Interim medical history since our last visit reviewed. Allergies and medications reviewed and updated. Outpatient Medications Prior to Visit  Medication Sig Dispense Refill   atorvastatin (LIPITOR) 10 MG tablet Take 1 tablet by mouth once daily 90 tablet 2   losartan (COZAAR) 50 MG tablet Take 1 tablet (50 mg total) by mouth daily. 90 tablet 1   No facility-administered medications prior to visit.     Per HPI unless specifically indicated in ROS section below Review of Systems  Constitutional:  Negative for fatigue and fever.  HENT:  Negative for ear pain.   Eyes:  Negative for pain.  Respiratory:  Negative for cough and shortness of breath.   Cardiovascular:  Negative for chest pain, palpitations and  leg swelling.  Gastrointestinal:  Negative for abdominal pain.  Genitourinary:  Negative for dysuria.  Musculoskeletal:  Negative for arthralgias.  Neurological:  Negative for syncope, light-headedness and headaches.  Psychiatric/Behavioral:  Negative for dysphoric mood.   Objective:  BP 122/78   Pulse 70   Temp (!) 97.2 F (36.2 C) (Temporal)   Ht 5\' 6"  (1.676 m)   Wt 201 lb (91.2 kg)   SpO2 98%   BMI 32.44 kg/m   Wt Readings from Last 3 Encounters:  05/11/21 201 lb (91.2 kg)  02/02/21 200 lb (90.7 kg)  11/09/20 202 lb 12 oz (92 kg)      Physical Exam Constitutional:      Appearance: He is well-developed.  HENT:     Head: Normocephalic.     Right Ear: Hearing normal.     Left Ear: Hearing normal.     Nose: Nose normal.  Neck:     Thyroid: No thyroid mass or thyromegaly.     Vascular: No carotid bruit.     Trachea: Trachea normal.  Cardiovascular:     Rate and Rhythm: Normal rate and regular rhythm.     Pulses: Normal pulses.     Heart sounds: Heart sounds not distant. No murmur heard.   No friction rub. No gallop.     Comments: No peripheral edema Pulmonary:     Effort: Pulmonary effort is normal. No respiratory distress.     Breath sounds: Normal breath sounds.  Skin:  General: Skin is warm and dry.     Findings: No rash.  Psychiatric:        Speech: Speech normal.        Behavior: Behavior normal.        Thought Content: Thought content normal.   Diabetic foot exam: Normal inspection No skin breakdown No calluses  Normal DP pulses Normal sensation to light touch and monofilament Nails normal     Results for orders placed or performed in visit on 04/30/21  HM DIABETES EYE EXAM  Result Value Ref Range   HM Diabetic Eye Exam No Retinopathy No Retinopathy    This visit occurred during the SARS-CoV-2 public health emergency.  Safety protocols were in place, including screening questions prior to the visit, additional usage of staff PPE, and extensive  cleaning of exam room while observing appropriate contact time as indicated for disinfecting solutions.   COVID 19 screen:  No recent travel or known exposure to COVID19 The patient denies respiratory symptoms of COVID 19 at this time. The importance of social distancing was discussed today.   Assessment and Plan    Problem List Items Addressed This Visit     History of prostate cancer   Relevant Orders   PSA, Medicare   Hyperlipidemia associated with type 2 diabetes mellitus ([place]) - Primary    Due for re-eval. Labs pending.      Hypertension associated with diabetes (HCC)    Stable, chronic.  Continue current medication.    losartan 50 mg daily      Type 2 diabetes mellitus with other circulatory complications HTN ([place])    Previously well controlled with diet. A1C pending.      Relevant Orders   Ambulatory referral to Ophthalmology   S/P prostate cancer: Pt requested PSA for urologist appt coming up.  Chase Lofts, MD

## 2021-05-11 NOTE — Assessment & Plan Note (Signed)
Due for re-eval. Labs pending.

## 2021-10-17 DIAGNOSIS — N529 Male erectile dysfunction, unspecified: Secondary | ICD-10-CM | POA: Diagnosis not present

## 2021-10-17 DIAGNOSIS — E669 Obesity, unspecified: Secondary | ICD-10-CM | POA: Diagnosis not present

## 2021-10-17 DIAGNOSIS — Z8546 Personal history of malignant neoplasm of prostate: Secondary | ICD-10-CM | POA: Diagnosis not present

## 2021-10-17 DIAGNOSIS — E785 Hyperlipidemia, unspecified: Secondary | ICD-10-CM | POA: Diagnosis not present

## 2021-10-17 DIAGNOSIS — Z87891 Personal history of nicotine dependence: Secondary | ICD-10-CM | POA: Diagnosis not present

## 2021-10-17 DIAGNOSIS — Z008 Encounter for other general examination: Secondary | ICD-10-CM | POA: Diagnosis not present

## 2021-10-17 DIAGNOSIS — Z6832 Body mass index (BMI) 32.0-32.9, adult: Secondary | ICD-10-CM | POA: Diagnosis not present

## 2021-10-17 DIAGNOSIS — I10 Essential (primary) hypertension: Secondary | ICD-10-CM | POA: Diagnosis not present

## 2021-11-26 ENCOUNTER — Ambulatory Visit (INDEPENDENT_AMBULATORY_CARE_PROVIDER_SITE_OTHER): Payer: Medicare HMO

## 2021-11-26 VITALS — Ht 67.0 in | Wt 200.0 lb

## 2021-11-26 DIAGNOSIS — Z0000 Encounter for general adult medical examination without abnormal findings: Secondary | ICD-10-CM

## 2021-11-26 NOTE — Patient Instructions (Signed)
Chase Moreno , ?Thank you for taking time to come for your Medicare Wellness Visit. I appreciate your ongoing commitment to your health goals. Please review the following plan we discussed and let me know if I can assist you in the future.  ? ?Screening recommendations/referrals: ?Colonoscopy: cologuard 06/30/2019, due 06/29/2022 ?Recommended yearly ophthalmology/optometry visit for glaucoma screening and checkup ?Recommended yearly dental visit for hygiene and checkup ? ?Vaccinations: ?Influenza vaccine: due 02/12/2022 ?Pneumococcal vaccine: completed 05/27/2017 ?Tdap vaccine: due ?Shingles vaccine: discussed   ?Covid-19:  08/20/2019, 07/23/2019 ? ?Advanced directives: Advance directive discussed with you today.  ? ?Conditions/risks identified: none ? ?Next appointment: Follow up in one year for your annual wellness visit.  ? ?Preventive Care 29 Years and Older, Male ?Preventive care refers to lifestyle choices and visits with your health care provider that can promote health and wellness. ?What does preventive care include? ?A yearly physical exam. This is also called an annual well check. ?Dental exams once or twice a year. ?Routine eye exams. Ask your health care provider how often you should have your eyes checked. ?Personal lifestyle choices, including: ?Daily care of your teeth and gums. ?Regular physical activity. ?Eating a healthy diet. ?Avoiding tobacco and drug use. ?Limiting alcohol use. ?Practicing safe sex. ?Taking low doses of aspirin every day. ?Taking vitamin and mineral supplements as recommended by your health care provider. ?What happens during an annual well check? ?The services and screenings done by your health care provider during your annual well check will depend on your age, overall health, lifestyle risk factors, and family history of disease. ?Counseling  ?Your health care provider may ask you questions about your: ?Alcohol use. ?Tobacco use. ?Drug use. ?Emotional well-being. ?Home and  relationship well-being. ?Sexual activity. ?Eating habits. ?History of falls. ?Memory and ability to understand (cognition). ?Work and work Statistician. ?Screening  ?You may have the following tests or measurements: ?Height, weight, and BMI. ?Blood pressure. ?Lipid and cholesterol levels. These may be checked every 5 years, or more frequently if you are over 3 years old. ?Skin check. ?Lung cancer screening. You may have this screening every year starting at age 75 if you have a 30-pack-year history of smoking and currently smoke or have quit within the past 15 years. ?Fecal occult blood test (FOBT) of the stool. You may have this test every year starting at age 50. ?Flexible sigmoidoscopy or colonoscopy. You may have a sigmoidoscopy every 5 years or a colonoscopy every 10 years starting at age 100. ?Prostate cancer screening. Recommendations will vary depending on your family history and other risks. ?Hepatitis C blood test. ?Hepatitis B blood test. ?Sexually transmitted disease (STD) testing. ?Diabetes screening. This is done by checking your blood sugar (glucose) after you have not eaten for a while (fasting). You may have this done every 1-3 years. ?Abdominal aortic aneurysm (AAA) screening. You may need this if you are a current or former smoker. ?Osteoporosis. You may be screened starting at age 1 if you are at high risk. ?Talk with your health care provider about your test results, treatment options, and if necessary, the need for more tests. ?Vaccines  ?Your health care provider may recommend certain vaccines, such as: ?Influenza vaccine. This is recommended every year. ?Tetanus, diphtheria, and acellular pertussis (Tdap, Td) vaccine. You may need a Td booster every 10 years. ?Zoster vaccine. You may need this after age 38. ?Pneumococcal 13-valent conjugate (PCV13) vaccine. One dose is recommended after age 21. ?Pneumococcal polysaccharide (PPSV23) vaccine. One dose is recommended after age  55. ?Talk to your  health care provider about which screenings and vaccines you need and how often you need them. ?This information is not intended to replace advice given to you by your health care provider. Make sure you discuss any questions you have with your health care provider. ?Document Released: 07/28/2015 Document Revised: 03/20/2016 Document Reviewed: 05/02/2015 ?Elsevier Interactive Patient Education ? 2017 [place]. ? ?Fall Prevention in the Home ?Falls can cause injuries. They can happen to people of all ages. There are many things you can do to make your home safe and to help prevent falls. ?What can I do on the outside of my home? ?Regularly fix the edges of walkways and driveways and fix any cracks. ?Remove anything that might make you trip as you walk through a door, such as a raised step or threshold. ?Trim any bushes or trees on the path to your home. ?Use bright outdoor lighting. ?Clear any walking paths of anything that might make someone trip, such as rocks or tools. ?Regularly check to see if handrails are loose or broken. Make sure that both sides of any steps have handrails. ?Any raised decks and porches should have guardrails on the edges. ?Have any leaves, snow, or ice cleared regularly. ?Use sand or salt on walking paths during winter. ?Clean up any spills in your garage right away. This includes oil or grease spills. ?What can I do in the bathroom? ?Use night lights. ?Install grab bars by the toilet and in the tub and shower. Do not use towel bars as grab bars. ?Use non-skid mats or decals in the tub or shower. ?If you need to sit down in the shower, use a plastic, non-slip stool. ?Keep the floor dry. Clean up any water that spills on the floor as soon as it happens. ?Remove soap buildup in the tub or shower regularly. ?Attach bath mats securely with double-sided non-slip rug tape. ?Do not have throw rugs and other things on the floor that can make you trip. ?What can I do in the bedroom? ?Use night  lights. ?Make sure that you have a light by your bed that is easy to reach. ?Do not use any sheets or blankets that are too big for your bed. They should not hang down onto the floor. ?Have a firm chair that has side arms. You can use this for support while you get dressed. ?Do not have throw rugs and other things on the floor that can make you trip. ?What can I do in the kitchen? ?Clean up any spills right away. ?Avoid walking on wet floors. ?Keep items that you use a lot in easy-to-reach places. ?If you need to reach something above you, use a strong step stool that has a grab bar. ?Keep electrical cords out of the way. ?Do not use floor polish or wax that makes floors slippery. If you must use wax, use non-skid floor wax. ?Do not have throw rugs and other things on the floor that can make you trip. ?What can I do with my stairs? ?Do not leave any items on the stairs. ?Make sure that there are handrails on both sides of the stairs and use them. Fix handrails that are broken or loose. Make sure that handrails are as long as the stairways. ?Check any carpeting to make sure that it is firmly attached to the stairs. Fix any carpet that is loose or worn. ?Avoid having throw rugs at the top or bottom of the stairs. If you do have  throw rugs, attach them to the floor with carpet tape. ?Make sure that you have a light switch at the top of the stairs and the bottom of the stairs. If you do not have them, ask someone to add them for you. ?What else can I do to help prevent falls? ?Wear shoes that: ?Do not have high heels. ?Have rubber bottoms. ?Are comfortable and fit you well. ?Are closed at the toe. Do not wear sandals. ?If you use a stepladder: ?Make sure that it is fully opened. Do not climb a closed stepladder. ?Make sure that both sides of the stepladder are locked into place. ?Ask someone to hold it for you, if possible. ?Clearly mark and make sure that you can see: ?Any grab bars or handrails. ?First and last  steps. ?Where the edge of each step is. ?Use tools that help you move around (mobility aids) if they are needed. These include: ?Canes. ?Walkers. ?Scooters. ?Crutches. ?Turn on the lights when you go into a dark are

## 2021-11-26 NOTE — Progress Notes (Signed)
?I connected with Bonnita Levan today by telephone and verified that I am speaking with the correct person using two identifiers. ?Location patient: home ?Location provider: work ?Persons participating in the virtual visit: Teague, Goynes LPN. ?  ?I discussed the limitations, risks, security and privacy concerns of performing an evaluation and management service by telephone and the availability of in person appointments. I also discussed with the patient that there may be a patient responsible charge related to this service. The patient expressed understanding and verbally consented to this telephonic visit.  ?  ?Interactive audio and video telecommunications were attempted between this provider and patient, however failed, due to patient having technical difficulties OR patient did not have access to video capability.  We continued and completed visit with audio only. ? ?  ? ?Vital signs may be patient reported or missing. ? ?Subjective:  ? Chase Moreno is a 71 y.o. male who presents for Medicare Annual/Subsequent preventive examination. ? ?Review of Systems    ? ?Cardiac Risk Factors include: advanced age (>63mn, >>57women);diabetes mellitus;dyslipidemia;hypertension;male gender;obesity (BMI >30kg/m2) ? ?   ?Objective:  ?  ?Today's Vitals  ? 11/26/21 1514  ?Weight: 200 lb (90.7 kg)  ?Height: '5\' 7"'$  (1.702 m)  ? ?Body mass index is 31.32 kg/m?. ? ? ?  11/26/2021  ?  3:18 PM 06/08/2019  ?  9:27 AM 05/29/2018  ?  8:19 AM 05/27/2017  ? 10:08 AM 02/17/2017  ?  2:04 PM 02/12/2017  ? 11:28 AM 11/28/2015  ? 12:19 PM  ?Advanced Directives  ?Does Patient Have a Medical Advance Directive? No Yes Yes No No No Yes  ?Type of ACorporate treasurerof ADeWittLiving will Living will    HVanleerLiving will  ?Does patient want to make changes to medical advance directive?       No - Patient declined  ?Copy of [place] Chart?  No - copy requested     No - copy  requested  ?Would patient like information on creating a medical advance directive?    Yes (MAU/Ambulatory/Procedural Areas - Information given) No - Patient declined No - Patient declined   ? ? ?Current Medications (verified) ?Outpatient Encounter Medications as of 11/26/2021  ?Medication Sig  ? atorvastatin (LIPITOR) 10 MG tablet Take 1 tablet by mouth once daily  ? losartan (COZAAR) 50 MG tablet Take 1 tablet (50 mg total) by mouth daily.  ? ?No facility-administered encounter medications on file as of 11/26/2021.  ? ? ?Allergies (verified) ?Patient has no known allergies.  ? ?History: ?Past Medical History:  ?Diagnosis Date  ? Allergic rhinitis, cause unspecified   ? [facility]   ? prostate cancer 2018  ? [place]   ? patient denies although dx in 2017 with a Dr BDiona Browner; last hgA1c was however 5.5   ? Unspecified essential hypertension   ? Unspecified sleep apnea   ? CPAP machine use   ? ?Past Surgical History:  ?Procedure Laterality Date  ? FRACTURE SURGERY    ? left arm   ? LYMPHADENECTOMY Bilateral 02/17/2017  ? Procedure: LYMPHADENECTOMY;  Surgeon: BRaynelle Bring MD;  Location: [facility];  Service: Urology;  Laterality: Bilateral;  ? ROBOT ASSISTED LAPAROSCOPIC RADICAL PROSTATECTOMY N/A 02/17/2017  ? Procedure: XI ROBOTIC ASSISTED LAPAROSCOPIC RADICAL PROSTATECTOMY LEVEL 2;  Surgeon: BRaynelle Bring MD;  Location: [facility];  Service: Urology;  Laterality: N/A;  ? TONSILLECTOMY    ? ?Family History  ?  Problem Relation Age of Onset  ? Arthritis Mother   ? Arrhythmia Mother   ? Stroke Paternal Grandmother   ? ?Social History  ? ?Socioeconomic History  ? Marital status: Married  ?  Spouse name: Not on file  ? Number of children: 2  ? Years of education: Not on file  ? Highest education level: Not on file  ?Occupational History  ? Occupation: machine shop  ?  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 Smoking status: Former  ? Smokeless tobacco: Never  ? Tobacco comments:  ?  quit  over 40 years ago  ?Vaping Use  ? Vaping Use: Never used  ?Substance and Sexual Activity  ? Alcohol use: No  ?  Alcohol/week: 0.0 standard drinks  ? Drug use: No  ? Sexual activity: Not on file  ?Other Topics Concern  ? Not on file  ?Social History Narrative  ? Regular exercise---yes, rabbit hunting, walking 2 times  A week.  ?   ? Diet---eating fruit and veggies, limiting meat.  ?   ? Widowed: Remarried.  ?   ?   ? ?Social Determinants of Health  ? ?Financial Resource Strain: Low Risk   ? Difficulty of Paying Living Expenses: Not hard at all  ?Food Insecurity: No Food Insecurity  ? Worried About Charity fundraiser in the Last Year: Never true  ? Ran Out of Food in the Last Year: Never true  ?Transportation Needs: No Transportation Needs  ? Lack of Transportation (Medical): No  ? Lack of Transportation (Non-Medical): No  ?Physical Activity: Inactive  ? Days of Exercise per Week: 0 days  ? Minutes of Exercise per Session: 0 min  ?Stress: No Stress Concern Present  ? Feeling of Stress : Not at all  ?Social Connections: Not on file  ? ? ?Tobacco Counseling ?Counseling given: Not Answered ?Tobacco comments: quit over 40 years ago ? ? ?Clinical Intake: ? ?Pre-visit preparation completed: Yes ? ?Pain : No/denies pain ? ?  ? ?Nutritional Status: BMI > 30  Obese ?Nutritional Risks: None ?Diabetes: Yes ? ?How often do you need to have someone help you when you read instructions, pamphlets, or other written materials from your doctor or pharmacy?: 1 - Never ?What is the last grade level you completed in school?: 12th grade ? ?Diabetic? Yes ?Nutrition Risk Assessment: ? ?Has the patient had any N/V/D within the last 2 months?  No  ?Does the patient have any non-healing wounds?  No  ?Has the patient had any unintentional weight loss or weight gain?  No  ? ?Diabetes: ? ?Is the patient diabetic?  Yes  ?If diabetic, was a CBG obtained today?  No  ?Did the patient bring in their glucometer from home?  No  ?How often do you monitor  your CBG's? Checks at doctor.  ? ?Financial Strains and Diabetes Management: ? ?Are you having any financial strains with the device, your supplies or your medication? No .  ?Does the patient want to be seen by Chronic Care Management for management of their diabetes?  No  ?Would the patient like to be referred to a Nutritionist or for Diabetic Management?  No  ? ?Diabetic Exams: ? ?Diabetic Eye Exam: Completed 03/31/2021 ?Diabetic Foot Exam: Completed 05/11/2021 ? ? ?Interpreter Needed?: No ? ?Information entered by :: NAllen LPN ? ? ?Activities of Daily Living ? ?  11/26/2021  ?  3:19 PM 05/11/2021  ?  9:11 AM  ?In your present state of health,  do you have any difficulty performing the following activities:  ?Hearing? 0 1  ?Vision? 0 0  ?Difficulty concentrating or making decisions? 0 0  ?Walking or climbing stairs? 0 0  ?Dressing or bathing? 0 0  ?Doing errands, shopping? 0 0  ?Preparing Food and eating ? N   ?Using the Toilet? N   ?In the past six months, have you accidently leaked urine? N   ?Do you have problems with loss of bowel control? N   ?Managing your Medications? N   ?Managing your Finances? N   ?Housekeeping or managing your Housekeeping? N   ? ? ?Patient Care Team: ?Jinny Sanders, MD as PCP - General ?Raynelle Bring, MD as Consulting Physician (Urology) ?Festus Aloe, MD as Consulting Physician (Urology) ?Parrett, Fonnie Mu, NP as Nurse Practitioner (Pulmonary Disease) ? ?Indicate any recent Medical Services you may have received from other than [facility] providers in the past year (date may be approximate). ? ?   ?Assessment:  ? This is a routine wellness examination for Scott. ? ?Hearing/Vision screen ?Vision Screening - Comments:: Regular eye exams,  ? ?Dietary issues and exercise activities discussed: ?Current Exercise Habits: The patient has a physically strenuous job, but has no regular exercise apart from work. ? ? Goals Addressed   ? ?  ?  ?  ?  ? This Visit's Progress  ?  Patient Stated     ?   11/26/2021, no goals ?  ? ?  ? ?Depression Screen ? ?  11/26/2021  ?  3:19 PM 11/09/2020  ?  8:50 AM 06/08/2019  ?  9:31 AM 05/29/2018  ?  8:15 AM 05/27/2017  ?  9:44 AM 11/28/2015  ? 11:23 AM  ?PHQ 2/9 Score

## 2022-01-09 ENCOUNTER — Other Ambulatory Visit: Payer: Self-pay | Admitting: Family Medicine

## 2022-01-16 ENCOUNTER — Encounter: Payer: Self-pay | Admitting: Family Medicine

## 2022-01-16 ENCOUNTER — Encounter: Payer: Medicare HMO | Admitting: Family Medicine

## 2022-01-16 ENCOUNTER — Ambulatory Visit (INDEPENDENT_AMBULATORY_CARE_PROVIDER_SITE_OTHER): Payer: Medicare HMO | Admitting: Family Medicine

## 2022-01-16 VITALS — BP 130/82 | HR 65 | Temp 97.7°F | Ht 66.25 in | Wt 205.5 lb

## 2022-01-16 DIAGNOSIS — E1159 Type 2 diabetes mellitus with other circulatory complications: Secondary | ICD-10-CM | POA: Diagnosis not present

## 2022-01-16 DIAGNOSIS — Z0000 Encounter for general adult medical examination without abnormal findings: Secondary | ICD-10-CM

## 2022-01-16 DIAGNOSIS — G4733 Obstructive sleep apnea (adult) (pediatric): Secondary | ICD-10-CM | POA: Diagnosis not present

## 2022-01-16 DIAGNOSIS — E785 Hyperlipidemia, unspecified: Secondary | ICD-10-CM | POA: Diagnosis not present

## 2022-01-16 DIAGNOSIS — I152 Hypertension secondary to endocrine disorders: Secondary | ICD-10-CM | POA: Diagnosis not present

## 2022-01-16 DIAGNOSIS — M7582 Other shoulder lesions, left shoulder: Secondary | ICD-10-CM

## 2022-01-16 DIAGNOSIS — E6609 Other obesity due to excess calories: Secondary | ICD-10-CM | POA: Diagnosis not present

## 2022-01-16 DIAGNOSIS — C61 Malignant neoplasm of prostate: Secondary | ICD-10-CM

## 2022-01-16 DIAGNOSIS — Z6831 Body mass index (BMI) 31.0-31.9, adult: Secondary | ICD-10-CM

## 2022-01-16 DIAGNOSIS — E1169 Type 2 diabetes mellitus with other specified complication: Secondary | ICD-10-CM

## 2022-01-16 LAB — COMPREHENSIVE METABOLIC PANEL
ALT: 12 U/L (ref 0–53)
AST: 18 U/L (ref 0–37)
Albumin: 4.4 g/dL (ref 3.5–5.2)
Alkaline Phosphatase: 61 U/L (ref 39–117)
BUN: 18 mg/dL (ref 6–23)
CO2: 26 mEq/L (ref 19–32)
Calcium: 9.2 mg/dL (ref 8.4–10.5)
Chloride: 106 mEq/L (ref 96–112)
Creatinine, Ser: 1.24 mg/dL (ref 0.40–1.50)
GFR: 58.33 mL/min — ABNORMAL LOW (ref >60.00)
Glucose, Bld: 73 mg/dL (ref 70–99)
Potassium: 3.9 mEq/L (ref 3.5–5.1)
Sodium: 138 mEq/L (ref 135–145)
Total Bilirubin: 0.4 mg/dL (ref 0.2–1.2)
Total Protein: 7.2 g/dL (ref 6.0–8.3)

## 2022-01-16 LAB — LIPID PANEL
Cholesterol: 152 mg/dL (ref 0–200)
HDL: 48.7 mg/dL (ref >39.00)
LDL Cholesterol: 91 mg/dL (ref 0–99)
NonHDL: 103.56
Total CHOL/HDL Ratio: 3
Triglycerides: 61 mg/dL (ref 0.0–149.0)
VLDL: 12.2 mg/dL (ref 0.0–40.0)

## 2022-01-16 LAB — HEMOGLOBIN A1C: Hgb A1c MFr Bld: 6.1 % (ref 4.6–6.5)

## 2022-01-16 NOTE — Assessment & Plan Note (Signed)
Acute  Can use topical diclfofenac gel OTC as needed for shoulder pain.  Start home physical therapy.

## 2022-01-16 NOTE — Patient Instructions (Addendum)
Please stop at the lab to have labs drawn.  Can use topical diclfofenac gel OTC as needed for shoulder pain.  Start home physical therapy.  Can get Td at pharmacy.

## 2022-01-16 NOTE — Assessment & Plan Note (Signed)
Due fore re-eval.

## 2022-01-16 NOTE — Progress Notes (Signed)
Patient ID: Chase Moreno, male    DOB: 12-14-1950, 71 y.o.   MRN: [id number]  This visit was conducted in person.  BP 130/82   Pulse 65   Temp 97.7 F (36.5 C) (Oral)   Ht 5' 6.25" (1.683 m)   Wt 205 lb 8 oz (93.2 kg)   SpO2 95%   BMI 32.92 kg/m    CC:  Chief Complaint  Patient presents with   Annual Exam    Part 2  ([place] 11/26/21)    Subjective:   HPI: Chase Moreno is a 71 y.o. male presenting on 01/16/2022 for Annual Exam (Part 2  ([place] 11/26/21))  The patient presents for  complete physical and review of chronic health problems. He/She also has the following acute concerns today: left shoulder pain... occ waking him up at night.. feels better with moving.  No issues during the day with activity.  Elevated Cholesterol:  LDL at goal < 100 on  atorvastatin 10 mg daily in past.. due for re-eval. Lab Results  Component Value Date   CHOL 128 05/11/2021   HDL 38.10 (L) 05/11/2021   LDLCALC 78 05/11/2021   LDLDIRECT 169.8 02/24/2012   TRIG 62.0 05/11/2021   CHOLHDL 3 05/11/2021  Using medications without problems: none Muscle aches:  none Diet compliance: healthy eating.. fair Exercise: walking daily, very active Other complaints:  Hypertension:  At goal on losartan 50 mg p.o. daily BP Readings from Last 3 Encounters:  01/16/22 130/82  05/11/21 122/78  02/02/21 (!) 142/96   Using medication without problems or lightheadedness:  none Chest pain with exertion: none Edema: none Short of breath: none Average home BPs: Other issues:  Diabetes: Diet controlled in past.. due for re-eval. Lab Results  Component Value Date   HGBA1C 5.8 05/11/2021  Using medications without difficulties: Hypoglycemic episodes: Hyperglycemic episodes: Feet problems: Blood Sugars averaging: eye exam within last year:  Obesity causing OSA and DM.. stable on CPAp    Wt Readings from Last 3 Encounters:  01/16/22 205 lb 8 oz (93.2 kg)  11/26/21 200 lb (90.7 kg)  05/11/21 201 lb (91.2  kg)     Relevant past medical, surgical, family and social history reviewed and updated as indicated. Interim medical history since our last visit reviewed. Allergies and medications reviewed and updated. Outpatient Medications Prior to Visit  Medication Sig Dispense Refill   atorvastatin (LIPITOR) 10 MG tablet Take 1 tablet by mouth once daily 90 tablet 2   losartan (COZAAR) 50 MG tablet Take 1 tablet by mouth once daily 90 tablet 0   No facility-administered medications prior to visit.     Per HPI unless specifically indicated in ROS section below Review of Systems  Constitutional:  Negative for fatigue and fever.  HENT:  Negative for ear pain.   Eyes:  Negative for pain.  Respiratory:  Negative for cough and shortness of breath.   Cardiovascular:  Negative for chest pain, palpitations and leg swelling.  Gastrointestinal:  Negative for abdominal pain.  Genitourinary:  Negative for dysuria.  Musculoskeletal:  Negative for arthralgias.  Neurological:  Negative for syncope, light-headedness and headaches.  Psychiatric/Behavioral:  Negative for dysphoric mood.    Objective:  BP 130/82   Pulse 65   Temp 97.7 F (36.5 C) (Oral)   Ht 5' 6.25" (1.683 m)   Wt 205 lb 8 oz (93.2 kg)   SpO2 95%   BMI 32.92 kg/m   Wt Readings from Last 3 Encounters:  01/16/22 205 lb  8 oz (93.2 kg)  11/26/21 200 lb (90.7 kg)  05/11/21 201 lb (91.2 kg)      Physical Exam Constitutional:      Appearance: He is well-developed.  HENT:     Head: Normocephalic.     Right Ear: Hearing normal.     Left Ear: Hearing normal.     Nose: Nose normal.  Neck:     Thyroid: No thyroid mass or thyromegaly.     Vascular: No carotid bruit.     Trachea: Trachea normal.  Cardiovascular:     Rate and Rhythm: Normal rate and regular rhythm.     Pulses: Normal pulses.     Heart sounds: Heart sounds not distant. No murmur heard.    No friction rub. No gallop.     Comments: No peripheral edema Pulmonary:      Effort: Pulmonary effort is normal. No respiratory distress.     Breath sounds: Normal breath sounds.  Skin:    General: Skin is warm and dry.     Findings: No rash.  Psychiatric:        Speech: Speech normal.        Behavior: Behavior normal.        Thought Content: Thought content normal.       Results for orders placed or performed in visit on 05/11/21  Comprehensive metabolic panel  Result Value Ref Range   Sodium 140 135 - 145 mEq/L   Potassium 4.4 3.5 - 5.1 mEq/L   Chloride 108 96 - 112 mEq/L   CO2 27 19 - 32 mEq/L   Glucose, Bld 99 70 - 99 mg/dL   BUN 14 6 - 23 mg/dL   Creatinine, Ser 1.26 0.40 - 1.50 mg/dL   Total Bilirubin 0.3 0.2 - 1.2 mg/dL   Alkaline Phosphatase 60 39 - 117 U/L   AST 20 0 - 37 U/L   ALT 12 0 - 53 U/L   Total Protein 7.7 6.0 - 8.3 g/dL   Albumin 4.2 3.5 - 5.2 g/dL   GFR 57.49 (L) >60.00 mL/min   Calcium 9.0 8.4 - 10.5 mg/dL  Lipid panel  Result Value Ref Range   Cholesterol 128 0 - 200 mg/dL   Triglycerides 62.0 0.0 - 149.0 mg/dL   HDL 38.10 (L) >39.00 mg/dL   VLDL 12.4 0.0 - 40.0 mg/dL   LDL Cholesterol 78 0 - 99 mg/dL   Total CHOL/HDL Ratio 3    NonHDL 90.11   Hemoglobin A1c  Result Value Ref Range   Hgb A1c MFr Bld 5.8 4.6 - 6.5 %  PSA, Medicare  Result Value Ref Range   PSA 0.01 (L) 0.10 - 4.00 ng/ml  HM DIABETES FOOT EXAM  Result Value Ref Range   HM Diabetic Foot Exam done      COVID 19 screen:  No recent travel or known exposure to COVID19 The patient denies respiratory symptoms of COVID 19 at this time. The importance of social distancing was discussed today.   Assessment and Plan   The patient's preventative maintenance and recommended screening tests for an annual wellness exam were reviewed in full today. Brought up to date unless services declined.  Counselled on the importance of diet, exercise, and its role in overall health and mortality. The patient's FH and SH was reviewed, including their home life, tobacco  status, and drug and alcohol status.   Vaccines: uptodate flu, PNA due for td and new shingles vaccine ( refused).  Discussed COVID19 vaccine  side effects and benefits. Strongly encouraged the patient to get the vaccine. Questions answered... discuss booster. Colon: 10/09/2007 Dr. Fuller Plan, polyps,  Cologuard 2020 negative... repeat in 3 years Hep C: neg  nonsmoker Prostate:  02/2017 S/p  Robotic assisted prostatectomy followed by Dr. Alinda Money.  Yearly visit  ETOH: none    Problem List Items Addressed This Visit     Class 1 obesity due to excess calories with serious comorbidity and body mass index (BMI) of 31.0 to 31.9 in adult    Encouraged exercise, weight loss, healthy eating habits.       Hyperlipidemia associated with type 2 diabetes mellitus ([place])    Due fore re-eval.      Hypertension associated with diabetes ([place])    Stable, chronic.  Continue current medication.   Losartan 50 mg daily      OSA (obstructive sleep apnea)    stable on CPAp      Tendonitis of left rotator cuff     Acute  Can use topical diclfofenac gel OTC as needed for shoulder pain.  Start home physical therapy.       Type 2 diabetes mellitus with other circulatory complications HTN (HCC)    Due fore re-eval.      Relevant Orders   Hemoglobin A1c   Lipid panel   Comprehensive metabolic panel   Other Visit Diagnoses     Routine general medical examination at a health care facility    -  Primary   Prostate cancer [facility])       Relevant Orders   PSA, Medicare      Orders Placed This Encounter  Procedures   Hemoglobin A1c   Lipid panel   Comprehensive metabolic panel   PSA, Medicare    Standing Status:   Future    Standing Expiration Date:   04/26/2022     Eliezer Lofts, MD

## 2022-01-16 NOTE — Assessment & Plan Note (Signed)
stable on CPAp

## 2022-01-16 NOTE — Assessment & Plan Note (Signed)
Encouraged exercise, weight loss, healthy eating habits. ? ?

## 2022-01-16 NOTE — Assessment & Plan Note (Signed)
Stable, chronic.  Continue current medication.  Losartan 50 mg daily 

## 2022-01-17 ENCOUNTER — Encounter: Payer: Self-pay | Admitting: *Deleted

## 2022-01-29 ENCOUNTER — Encounter: Payer: Medicare HMO | Admitting: Family Medicine

## 2022-03-13 ENCOUNTER — Other Ambulatory Visit: Payer: Self-pay | Admitting: Family Medicine

## 2022-04-19 ENCOUNTER — Encounter: Payer: Self-pay | Admitting: Family Medicine

## 2022-04-19 ENCOUNTER — Ambulatory Visit (INDEPENDENT_AMBULATORY_CARE_PROVIDER_SITE_OTHER): Payer: Medicare HMO | Admitting: Family Medicine

## 2022-04-19 VITALS — BP 136/90 | HR 62 | Temp 98.2°F | Ht 66.25 in | Wt 207.2 lb

## 2022-04-19 DIAGNOSIS — I152 Hypertension secondary to endocrine disorders: Secondary | ICD-10-CM | POA: Diagnosis not present

## 2022-04-19 DIAGNOSIS — E1159 Type 2 diabetes mellitus with other circulatory complications: Secondary | ICD-10-CM | POA: Diagnosis not present

## 2022-04-19 DIAGNOSIS — Z23 Encounter for immunization: Secondary | ICD-10-CM | POA: Diagnosis not present

## 2022-04-19 LAB — POCT GLYCOSYLATED HEMOGLOBIN (HGB A1C): Hemoglobin A1C: 5.6 % (ref 4.0–5.6)

## 2022-04-19 NOTE — Progress Notes (Signed)
Patient ID: Chase Moreno, male    DOB: [birth year], 71 y.o.   MRN: [id number]  This visit was conducted in person.  BP (!) 136/90   Pulse 62   Temp 98.2 F (36.8 C) (Oral)   Ht 5' 6.25" (1.683 m)   Wt 207 lb 4 oz (94 kg)   SpO2 97%   BMI 33.20 kg/m    CC:  Chief Complaint  Patient presents with   Diabetes   Medicare Wellness Visit with nurse, followed by fasting labs then  20 minute CPE with Dr. Diona Browner   Subjective:   HPI: Chase Moreno is a 71 y.o. male presenting on 04/19/2022 for Diabetes  Diabetes:   Well controlled with diet... improved from last check Lab Results  Component Value Date   HGBA1C 5.6 04/19/2022  Using medications without difficulties: Hypoglycemic episodes: Hyperglycemic episodes: Feet problems: Blood Sugars averaging: eye exam within last year:  Wt Readings from Last 3 Encounters:  04/19/22 207 lb 4 oz (94 kg)  01/16/22 205 lb 8 oz (93.2 kg)  11/26/21 200 lb (90.7 kg)   He has been walking some in last few weeks.  No CP, no SOB.      Relevant past medical, surgical, family and social history reviewed and updated as indicated. Interim medical history since our last visit reviewed. Allergies and medications reviewed and updated. Outpatient Medications Prior to Visit  Medication Sig Dispense Refill   Ascorbic Acid (VITAMIN C PO) Take 1 tablet by mouth daily.     aspirin EC 81 MG tablet Take 81 mg by mouth daily. Swallow whole.     atorvastatin (LIPITOR) 10 MG tablet Take 1 tablet by mouth once daily 90 tablet 3   losartan (COZAAR) 50 MG tablet Take 1 tablet by mouth once daily 90 tablet 0   VITAMIN D PO Take 1 tablet by mouth daily.     No facility-administered medications prior to visit.     Per HPI unless specifically indicated in ROS section below Review of Systems  Constitutional:  Negative for fatigue and fever.  HENT:  Negative for ear pain.   Eyes:  Negative for pain.  Respiratory:  Negative for cough and shortness of breath.    Cardiovascular:  Negative for chest pain, palpitations and leg swelling.  Gastrointestinal:  Negative for abdominal pain.  Genitourinary:  Negative for dysuria.  Musculoskeletal:  Negative for arthralgias.  Neurological:  Negative for syncope, light-headedness and headaches.  Psychiatric/Behavioral:  Negative for dysphoric mood.    Objective:  BP (!) 136/90   Pulse 62   Temp 98.2 F (36.8 C) (Oral)   Ht 5' 6.25" (1.683 m)   Wt 207 lb 4 oz (94 kg)   SpO2 97%   BMI 33.20 kg/m   Wt Readings from Last 3 Encounters:  04/19/22 207 lb 4 oz (94 kg)  01/16/22 205 lb 8 oz (93.2 kg)  11/26/21 200 lb (90.7 kg)      Physical Exam Constitutional:      Appearance: He is well-developed.  HENT:     Head: Normocephalic.     Right Ear: Hearing normal.     Left Ear: Hearing normal.     Nose: Nose normal.  Neck:     Thyroid: No thyroid mass or thyromegaly.     Vascular: No carotid bruit.     Trachea: Trachea normal.  Cardiovascular:     Rate and Rhythm: Normal rate and regular rhythm.     Pulses: Normal pulses.  Heart sounds: Heart sounds not distant. No murmur heard.    No friction rub. No gallop.     Comments: No peripheral edema Pulmonary:     Effort: Pulmonary effort is normal. No respiratory distress.     Breath sounds: Normal breath sounds.  Skin:    General: Skin is warm and dry.     Findings: No rash.  Psychiatric:        Speech: Speech normal.        Behavior: Behavior normal.        Thought Content: Thought content normal.       Results for orders placed or performed in visit on 04/19/22  POCT glycosylated hemoglobin (Hb A1C)  Result Value Ref Range   Hemoglobin A1C 5.6 4.0 - 5.6 %   HbA1c POC (<> result, manual entry)     HbA1c, POC (prediabetic range)     HbA1c, POC (controlled diabetic range)       COVID 19 screen:  No recent travel or known exposure to COVID19 The patient denies respiratory symptoms of COVID 19 at this time. The importance of social  distancing was discussed today.   Assessment and Plan    Problem List Items Addressed This Visit     Hypertension associated with diabetes ([place])    Stable, chronic.  Continue current medication.   Losartan 50 mg p.o. daily      Relevant Medications   aspirin EC 81 MG tablet   Type 2 diabetes mellitus with other circulatory complications HTN (HCC) - Primary    Chronic, improving control with lifestyle.  We discussed increasing exercise and he will be getting back to hunting soon.      Relevant Medications   aspirin EC 81 MG tablet   Other Relevant Orders   POCT glycosylated hemoglobin (Hb A1C) (Completed)   Other Visit Diagnoses     Need for influenza vaccination       Relevant Orders   Flu Vaccine QUAD High Dose(Fluad) (Completed)        Chase Lofts, MD

## 2022-04-19 NOTE — Assessment & Plan Note (Signed)
Chronic, improving control with lifestyle.  We discussed increasing exercise and he will be getting back to hunting soon.

## 2022-04-19 NOTE — Assessment & Plan Note (Signed)
Stable, chronic.  Continue current medication.  Losartan 50 mg p.o. daily 

## 2022-05-06 ENCOUNTER — Other Ambulatory Visit: Payer: Self-pay | Admitting: Family Medicine

## 2022-07-12 ENCOUNTER — Other Ambulatory Visit: Payer: Medicare HMO

## 2022-07-19 ENCOUNTER — Ambulatory Visit: Payer: Medicare HMO | Admitting: Family Medicine

## 2022-08-08 ENCOUNTER — Encounter: Payer: Self-pay | Admitting: Family Medicine

## 2022-08-08 ENCOUNTER — Ambulatory Visit (INDEPENDENT_AMBULATORY_CARE_PROVIDER_SITE_OTHER): Payer: Medicare HMO | Admitting: Family Medicine

## 2022-08-08 VITALS — BP 134/78 | HR 78 | Temp 98.0°F | Resp 16 | Ht 66.25 in | Wt 205.1 lb

## 2022-08-08 DIAGNOSIS — M722 Plantar fascial fibromatosis: Secondary | ICD-10-CM | POA: Diagnosis not present

## 2022-08-08 NOTE — Progress Notes (Signed)
Patient ID: Chase Moreno, male    DOB: 1951/05/04, 72 y.o.   MRN: [id number]  This visit was conducted in person.  BP 134/78   Pulse 78   Temp 98 F (36.7 C)   Resp 16   Ht 5' 6.25" (1.683 m)   Wt 205 lb 2 oz (93 kg)   SpO2 98%   BMI 32.86 kg/m    CC:  Chief Complaint  Patient presents with   Foot Injury    Heel pain sore but not all the time X 6 months    Subjective:   HPI: Chase Moreno is a 72 y.o. male presenting on 08/08/2022 for Foot Injury (Heel pain sore but not all the time X 6 months)  He has noted chronic pain in his right heel for the last 6 months. Progressively getting worse. No redness, no swelling.  No known injury.  No known fall.  He points to anterior point of heel as location of pain.  Notes after resting he has heel pain, better after walking.   Has not taken any me.  Tried different insoles, shoes.       Relevant past medical, surgical, family and social history reviewed and updated as indicated. Interim medical history since our last visit reviewed. Allergies and medications reviewed and updated. Outpatient Medications Prior to Visit  Medication Sig Dispense Refill   Ascorbic Acid (VITAMIN C PO) Take 1 tablet by mouth daily.     aspirin EC 81 MG tablet Take 81 mg by mouth daily. Swallow whole.     atorvastatin (LIPITOR) 10 MG tablet Take 1 tablet by mouth once daily 90 tablet 3   losartan (COZAAR) 50 MG tablet Take 1 tablet by mouth once daily 90 tablet 2   VITAMIN D PO Take 1 tablet by mouth daily.     No facility-administered medications prior to visit.     Per HPI unless specifically indicated in ROS section below Review of Systems  Constitutional:  Negative for fatigue and fever.  HENT:  Negative for ear pain.   Eyes:  Negative for pain.  Respiratory:  Negative for cough and shortness of breath.   Cardiovascular:  Negative for chest pain, palpitations and leg swelling.  Gastrointestinal:  Negative for abdominal pain.   Genitourinary:  Negative for dysuria.  Musculoskeletal:  Negative for arthralgias.  Neurological:  Negative for syncope, light-headedness and headaches.  Psychiatric/Behavioral:  Negative for dysphoric mood.    Objective:  BP 134/78   Pulse 78   Temp 98 F (36.7 C)   Resp 16   Ht 5' 6.25" (1.683 m)   Wt 205 lb 2 oz (93 kg)   SpO2 98%   BMI 32.86 kg/m   Wt Readings from Last 3 Encounters:  08/08/22 205 lb 2 oz (93 kg)  04/19/22 207 lb 4 oz (94 kg)  01/16/22 205 lb 8 oz (93.2 kg)      Physical Exam Constitutional:      Appearance: He is well-developed.  HENT:     Head: Normocephalic.     Right Ear: Hearing normal.     Left Ear: Hearing normal.     Nose: Nose normal.  Neck:     Thyroid: No thyroid mass or thyromegaly.     Vascular: No carotid bruit.     Trachea: Trachea normal.  Cardiovascular:     Rate and Rhythm: Normal rate and regular rhythm.     Pulses: Normal pulses.     Heart sounds:  Heart sounds not distant. No murmur heard.    No friction rub. No gallop.     Comments: No peripheral edema Pulmonary:     Effort: Pulmonary effort is normal. No respiratory distress.     Breath sounds: Normal breath sounds.  Musculoskeletal:     Right ankle: Normal. Normal range of motion. Normal pulse.     Left ankle: Normal. Normal range of motion. Normal pulse.     Right foot: Normal range of motion. Deformity present.     Left foot: Normal. Normal range of motion.     Comments: Ttp over insertion of plantar fascia  Skin:    General: Skin is warm and dry.     Findings: No rash.  Psychiatric:        Speech: Speech normal.        Behavior: Behavior normal.        Thought Content: Thought content normal.       Results for orders placed or performed in visit on 04/19/22  POCT glycosylated hemoglobin (Hb A1C)  Result Value Ref Range   Hemoglobin A1C 5.6 4.0 - 5.6 %   HbA1c POC (<> result, manual entry)     HbA1c, POC (prediabetic range)     HbA1c, POC (controlled  diabetic range)      Assessment and Plan  Plantar fasciitis, right Assessment & Plan:  Chronic... on feet a lot.  Recommended BID ice massage, NSAIDS prn pain and home PT.     Return if symptoms worsen or fail to improve.   Eliezer Lofts, MD

## 2022-08-08 NOTE — Assessment & Plan Note (Signed)
Chronic... on feet a lot.  Recommended BID ice massage, NSAIDS prn pain and home PT.

## 2022-08-08 NOTE — Patient Instructions (Signed)
Start ice massage and home physical therapy for foot pain.

## 2022-11-26 ENCOUNTER — Telehealth: Payer: Self-pay | Admitting: Family Medicine

## 2022-11-26 NOTE — Telephone Encounter (Signed)
Called patient to schedule Medicare Annual Wellness Visit (AWV). Left message for patient to call back and schedule Medicare Annual Wellness Visit (AWV). and Unable to reach patient.  Last date of AWV: 11/23/2021  Please schedule an appointment at any time with [facility] .  If any questions, please contact me at [phone].  Thank you ,  Randon Goldsmith Care Guide [facility] AWV TEAM Direct Dial: [phone]

## 2023-01-01 ENCOUNTER — Ambulatory Visit (INDEPENDENT_AMBULATORY_CARE_PROVIDER_SITE_OTHER): Payer: Medicare HMO

## 2023-01-01 VITALS — Ht 67.0 in | Wt 200.0 lb

## 2023-01-01 DIAGNOSIS — Z1211 Encounter for screening for malignant neoplasm of colon: Secondary | ICD-10-CM

## 2023-01-01 DIAGNOSIS — Z0000 Encounter for general adult medical examination without abnormal findings: Secondary | ICD-10-CM | POA: Diagnosis not present

## 2023-01-01 NOTE — Patient Instructions (Signed)
Chase Moreno , Thank you for taking time to come for your Medicare Wellness Visit. I appreciate your ongoing commitment to your health goals. Please review the following plan we discussed and let me know if I can assist you in the future.   These are the goals we discussed:  Goals      Increase physical activity     Starting 05/29/2018, I will continue to walk or to hunt for at least 60 min 3-4 days per week.      Patient Stated     06/08/2019, I will maintain and continue medications as prescribed.      Patient Stated     11/26/2021, no goals     Patient Stated     No new goals        This is a list of the screening recommended for you and due dates:  Health Maintenance  Topic Date Due   Yearly kidney health urinalysis for diabetes  07/26/2015   DTaP/Tdap/Td vaccine (2 - Tdap) 08/23/2017   COVID-19 Vaccine (3 - Moderna risk series) 09/17/2019   Zoster (Shingles) Vaccine (2 of 2) 11/14/2021   Eye exam for diabetics  03/31/2022   Complete foot exam   05/11/2022   Cologuard (Stool DNA test)  06/29/2022   Hemoglobin A1C  10/19/2022   Yearly kidney function blood test for diabetes  01/17/2023   Flu Shot  02/13/2023   Medicare Annual Wellness Visit  01/01/2024   Pneumonia Vaccine  Completed   Hepatitis C Screening  Completed   HPV Vaccine  Aged Out   Colon Cancer Screening  Discontinued    Advanced directives: none  Conditions/risks identified: Aim for 30 minutes of exercise or brisk walking, 6-8 glasses of water, and 5 servings of fruits and vegetables each day.   Next appointment: Follow up in one year for your annual wellness visit. 01/05/24 @ 8:15 telephone  Preventive Care 65 Years and Older, Male  Preventive care refers to lifestyle choices and visits with your health care provider that can promote health and wellness. What does preventive care include? A yearly physical exam. This is also called an annual well check. Dental exams once or twice a year. Routine eye  exams. Ask your health care provider how often you should have your eyes checked. Personal lifestyle choices, including: Daily care of your teeth and gums. Regular physical activity. Eating a healthy diet. Avoiding tobacco and drug use. Limiting alcohol use. Practicing safe sex. Taking low doses of aspirin every day. Taking vitamin and mineral supplements as recommended by your health care provider. What happens during an annual well check? The services and screenings done by your health care provider during your annual well check will depend on your age, overall health, lifestyle risk factors, and family history of disease. Counseling  Your health care provider may ask you questions about your: Alcohol use. Tobacco use. Drug use. Emotional well-being. Home and relationship well-being. Sexual activity. Eating habits. History of falls. Memory and ability to understand (cognition). Work and work Astronomer. Screening  You may have the following tests or measurements: Height, weight, and BMI. Blood pressure. Lipid and cholesterol levels. These may be checked every 5 years, or more frequently if you are over 2 years old. Skin check. Lung cancer screening. You may have this screening every year starting at age 105 if you have a 30-pack-year history of smoking and currently smoke or have quit within the past 15 years. Fecal occult blood test (FOBT) of the  stool. You may have this test every year starting at age 73. Flexible sigmoidoscopy or colonoscopy. You may have a sigmoidoscopy every 5 years or a colonoscopy every 10 years starting at age 73. Prostate cancer screening. Recommendations will vary depending on your family history and other risks. Hepatitis C blood test. Hepatitis B blood test. Sexually transmitted disease (STD) testing. Diabetes screening. This is done by checking your blood sugar (glucose) after you have not eaten for a while (fasting). You may have this done every  1-3 years. Abdominal aortic aneurysm (AAA) screening. You may need this if you are a current or former smoker. Osteoporosis. You may be screened starting at age 73 if you are at high risk. Talk with your health care provider about your test results, treatment options, and if necessary, the need for more tests. Vaccines  Your health care provider may recommend certain vaccines, such as: Influenza vaccine. This is recommended every year. Tetanus, diphtheria, and acellular pertussis (Tdap, Td) vaccine. You may need a Td booster every 10 years. Zoster vaccine. You may need this after age 53. Pneumococcal 13-valent conjugate (PCV13) vaccine. One dose is recommended after age 73. Pneumococcal polysaccharide (PPSV23) vaccine. One dose is recommended after age 73. Talk to your health care provider about which screenings and vaccines you need and how often you need them. This information is not intended to replace advice given to you by your health care provider. Make sure you discuss any questions you have with your health care provider. Document Released: 07/28/2015 Document Revised: 03/20/2016 Document Reviewed: 05/02/2015 Elsevier Interactive Patient Education  2017 [employer].  Fall Prevention in the Home Falls can cause injuries. They can happen to people of all ages. There are many things you can do to make your home safe and to help prevent falls. What can I do on the outside of my home? Regularly fix the edges of walkways and driveways and fix any cracks. Remove anything that might make you trip as you walk through a door, such as a raised step or threshold. Trim any bushes or trees on the path to your home. Use bright outdoor lighting. Clear any walking paths of anything that might make someone trip, such as rocks or tools. Regularly check to see if handrails are loose or broken. Make sure that both sides of any steps have handrails. Any raised decks and porches should have guardrails on  the edges. Have any leaves, snow, or ice cleared regularly. Use sand or salt on walking paths during winter. Clean up any spills in your garage right away. This includes oil or grease spills. What can I do in the bathroom? Use night lights. Install grab bars by the toilet and in the tub and shower. Do not use towel bars as grab bars. Use non-skid mats or decals in the tub or shower. If you need to sit down in the shower, use a plastic, non-slip stool. Keep the floor dry. Clean up any water that spills on the floor as soon as it happens. Remove soap buildup in the tub or shower regularly. Attach bath mats securely with double-sided non-slip rug tape. Do not have throw rugs and other things on the floor that can make you trip. What can I do in the bedroom? Use night lights. Make sure that you have a light by your bed that is easy to reach. Do not use any sheets or blankets that are too big for your bed. They should not hang down onto the floor.  Have a firm chair that has side arms. You can use this for support while you get dressed. Do not have throw rugs and other things on the floor that can make you trip. What can I do in the kitchen? Clean up any spills right away. Avoid walking on wet floors. Keep items that you use a lot in easy-to-reach places. If you need to reach something above you, use a strong step stool that has a grab bar. Keep electrical cords out of the way. Do not use floor polish or wax that makes floors slippery. If you must use wax, use non-skid floor wax. Do not have throw rugs and other things on the floor that can make you trip. What can I do with my stairs? Do not leave any items on the stairs. Make sure that there are handrails on both sides of the stairs and use them. Fix handrails that are broken or loose. Make sure that handrails are as long as the stairways. Check any carpeting to make sure that it is firmly attached to the stairs. Fix any carpet that is loose  or worn. Avoid having throw rugs at the top or bottom of the stairs. If you do have throw rugs, attach them to the floor with carpet tape. Make sure that you have a light switch at the top of the stairs and the bottom of the stairs. If you do not have them, ask someone to add them for you. What else can I do to help prevent falls? Wear shoes that: Do not have high heels. Have rubber bottoms. Are comfortable and fit you well. Are closed at the toe. Do not wear sandals. If you use a stepladder: Make sure that it is fully opened. Do not climb a closed stepladder. Make sure that both sides of the stepladder are locked into place. Ask someone to hold it for you, if possible. Clearly mark and make sure that you can see: Any grab bars or handrails. First and last steps. Where the edge of each step is. Use tools that help you move around (mobility aids) if they are needed. These include: Canes. Walkers. Scooters. Crutches. Turn on the lights when you go into a dark area. Replace any light bulbs as soon as they burn out. Set up your furniture so you have a clear path. Avoid moving your furniture around. If any of your floors are uneven, fix them. If there are any pets around you, be aware of where they are. Review your medicines with your doctor. Some medicines can make you feel dizzy. This can increase your chance of falling. Ask your doctor what other things that you can do to help prevent falls. This information is not intended to replace advice given to you by your health care provider. Make sure you discuss any questions you have with your health care provider. Document Released: 04/27/2009 Document Revised: 12/07/2015 Document Reviewed: 08/05/2014 Elsevier Interactive Patient Education  2017 [employer].

## 2023-01-01 NOTE — Progress Notes (Signed)
Subjective:   Chase Moreno is a 72 y.o. male who presents for Medicare Annual/Subsequent preventive examination.  Visit Complete: Virtual  I connected with  Chase Moreno on 01/01/23 by a audio enabled telemedicine application and verified that I am speaking with the correct person using two identifiers.  Patient Location: Home  Provider Location: Office/Clinic  I discussed the limitations of evaluation and management by telemedicine. The patient expressed understanding and agreed to proceed.  Review of Systems     Cardiac Risk Factors include: advanced age (>44men, >35 women);hypertension;obesity (BMI >30kg/m2);dyslipidemia     Objective:    Today's Vitals   01/01/23 0850  Weight: 200 lb (90.7 kg)  Height: 5\' 7"  (1.702 m)   Body mass index is 31.32 kg/m.     01/01/2023    9:03 AM 11/26/2021    3:18 PM 06/08/2019    9:27 AM 05/29/2018    8:19 AM 05/27/2017   10:08 AM 02/17/2017    2:04 PM 02/12/2017   11:28 AM  Advanced Directives  Does Patient Have a Medical Advance Directive? No No Yes Yes No No No  Type of Best boy of [place];Living will Living will     Copy of Healthcare Power of Attorney in Chart?   No - copy requested      Would patient like information on creating a medical advance directive? No - Patient declined    Yes (MAU/Ambulatory/Procedural Areas - Information given) No - Patient declined No - Patient declined    Current Medications (verified) Outpatient Encounter Medications as of 01/01/2023  Medication Sig   Ascorbic Acid (VITAMIN C PO) Take 1 tablet by mouth daily.   aspirin EC 81 MG tablet Take 81 mg by mouth daily. Swallow whole.   atorvastatin (LIPITOR) 10 MG tablet Take 1 tablet by mouth once daily   losartan (COZAAR) 50 MG tablet Take 1 tablet by mouth once daily   VITAMIN D PO Take 1 tablet by mouth daily.   No facility-administered encounter medications on file as of 01/01/2023.    Allergies (verified) Patient has  no known allergies.   History: Past Medical History:  Diagnosis Date   Allergic rhinitis, cause unspecified    Cancer (HCC)    prostate cancer 2018   Diabetes mellitus without complication (HCC)    patient denies although dx in 2017 with a Dr Ermalene Searing ; last hgA1c was however 5.5    Unspecified essential hypertension    Unspecified sleep apnea    CPAP machine use    Past Surgical History:  Procedure Laterality Date   FRACTURE SURGERY     left arm    LYMPHADENECTOMY Bilateral 02/17/2017   Procedure: LYMPHADENECTOMY;  Surgeon: Heloise Purpura, MD;  Location: [facility];  Service: Urology;  Laterality: Bilateral;   ROBOT ASSISTED LAPAROSCOPIC RADICAL PROSTATECTOMY N/A 02/17/2017   Procedure: XI ROBOTIC ASSISTED LAPAROSCOPIC RADICAL PROSTATECTOMY LEVEL 2;  Surgeon: Heloise Purpura, MD;  Location: [facility];  Service: Urology;  Laterality: N/A;   TONSILLECTOMY     Family History  Problem Relation Age of Onset   Arthritis Mother    Arrhythmia Mother    Stroke Paternal Grandmother    Social History   Socioeconomic History   Marital status: Married    Spouse name: Not on file   Number of children: 2   Years of education: Not on file   Highest education level: Not on file  Occupational History   Occupation: machine shop    Comment:  [employer]ufacturing company  Tobacco Use   Smoking status: Former   Smokeless tobacco: Never   Tobacco comments:    quit over 40 years ago  Vaping Use   Vaping Use: Never used  Substance and Sexual Activity   Alcohol use: No    Alcohol/week: 0.0 standard drinks of alcohol   Drug use: No   Sexual activity: Not on file  Other Topics Concern   Not on file  Social History Narrative   Regular exercise---yes, rabbit hunting, walking 2 times  A week.      Diet---eating fruit and veggies, limiting meat.      Widowed: Remarried.         Social Determinants of Health   Financial Resource Strain: Low Risk  (01/01/2023)   Overall Financial Resource Strain (CARDIA)     Difficulty of Paying Living Expenses: Not hard at all  Food Insecurity: No Food Insecurity (01/01/2023)   Hunger Vital Sign    Worried About Running Out of Food in the Last Year: Never true    Ran Out of Food in the Last Year: Never true  Transportation Needs: No Transportation Needs (01/01/2023)   PRAPARE - Administrator, Civil Service (Medical): No    Lack of Transportation (Non-Medical): No  Physical Activity: Insufficiently Active (01/01/2023)   Exercise Vital Sign    Days of Exercise per Week: 2 days    Minutes of Exercise per Session: 30 min  Stress: No Stress Concern Present (01/01/2023)   [employer] of Occupational Health - Occupational Stress Questionnaire    Feeling of Stress : Not at all  Social Connections: Socially Integrated (01/01/2023)   Social Connection and Isolation Panel [NHANES]    Frequency of Communication with Friends and Family: More than three times a week    Frequency of Social Gatherings with Friends and Family: More than three times a week    Attends Religious Services: More than 4 times per year    Active Member of [employer] or Organizations: Yes    Attends Engineer, structural: More than 4 times per year    Marital Status: Married    Tobacco Counseling Counseling given: Not Answered Tobacco comments: quit over 40 years ago   Clinical Intake:  Pre-visit preparation completed: Yes  Pain : No/denies pain     Nutritional Risks: None Diabetes: No  How often do you need to have someone help you when you read instructions, pamphlets, or other written materials from your doctor or pharmacy?: 1 - Never  Interpreter Needed?: No  Information entered by :: R. Laws LPN   Activities of Daily Living    01/01/2023    9:03 AM  In your present state of health, do you have any difficulty performing the following activities:  Hearing? 1  Comment some difficulty  Vision? 0  Difficulty concentrating or making decisions? 0   Walking or climbing stairs? 0  Dressing or bathing? 0  Doing errands, shopping? 0  Preparing Food and eating ? N  Using the Toilet? N  In the past six months, have you accidently leaked urine? N  Do you have problems with loss of bowel control? N  Managing your Medications? N  Managing your Finances? N  Housekeeping or managing your Housekeeping? N    Patient Care Team: Excell Seltzer, MD as PCP - Rosezetta Schlatter, MD as Consulting Physician (Urology) Jerilee Field, MD as Consulting Physician (Urology) Parrett, Virgel Bouquet, NP as Nurse Practitioner (Pulmonary Disease)  Indicate any recent Medical Services you may have received from other than [facility] providers in the past year (date may be approximate).     Assessment:   This is a routine wellness examination for Rupert.  Hearing/Vision screen Hearing Screening - Comments:: Some difficulty at times Vision Screening - Comments:: Readers, [facility]  Dietary issues and exercise activities discussed:     Goals Addressed             This Visit's Progress    Patient Stated       No new goals       Depression Screen    01/01/2023    9:01 AM 08/08/2022   10:46 AM 11/26/2021    3:19 PM 11/09/2020    8:50 AM 06/08/2019    9:31 AM 05/29/2018    8:15 AM 05/27/2017    9:44 AM  PHQ 2/9 Scores  PHQ - 2 Score 0 0 0 0 0 0 0  PHQ- 9 Score     0 0 0    Fall Risk    01/01/2023    8:55 AM 08/08/2022   10:45 AM 11/26/2021    3:19 PM 11/09/2020    8:50 AM 06/08/2019    9:29 AM  Fall Risk   Falls in the past year? 0 0 0 0 0  Number falls in past yr: 0 0 0  0  Injury with Fall? 0 0 0  0  Risk for fall due to : No Fall Risks No Fall Risks Medication side effect  Medication side effect  Follow up Falls prevention discussed;Falls evaluation completed  Falls evaluation completed;Education provided;Falls prevention discussed  Falls evaluation completed;Falls prevention discussed    MEDICARE RISK AT HOME:  Medicare Risk  at Home - 01/01/23 0907     Any stairs in or around the home? No    If so, are there any without handrails? No    Home free of loose throw rugs in walkways, pet beds, electrical cords, etc? Yes    Adequate lighting in your home to reduce risk of falls? Yes    Life alert? No    Use of a cane, walker or w/c? No    Grab bars in the bathroom? No    Shower chair or bench in shower? No    Elevated toilet seat or a handicapped toilet? Yes               Cognitive Function:    06/08/2019    9:39 AM 05/29/2018    8:15 AM 05/27/2017    9:44 AM  MMSE - Mini Mental State Exam  Orientation to time 5 5 5   Orientation to Place 5 5 5   Registration 3 3 3   Attention/ Calculation 5 0 0  Recall 3 3 3   Language- name 2 objects  0 0  Language- repeat 1 1 1   Language- follow 3 step command  3 3  Language- read & follow direction  0 0  Write a sentence  0 0  Copy design  0 0  Total score  20 20        01/01/2023    9:06 AM 11/26/2021    3:20 PM  6CIT Screen  What Year? 0 points 0 points  What month? 0 points 0 points  What time? 0 points 3 points  Count back from 20 0 points 0 points  Months in reverse 0 points 0 points  Repeat phrase 0 points 4 points  Total  Score 0 points 7 points    Immunizations Immunization History  Administered Date(s) Administered   Fluad Quad(high Dose 65+) 04/29/2019, 05/11/2021, 04/19/2022   Influenza Nasal 04/14/2013   Influenza,inj,Quad PF,6+ Mos 09/12/2015, 04/13/2018   Influenza,trivalent, recombinat, inj, PF 05/04/2017   Influenza-Unspecified 04/18/2014   Moderna Sars-Covid-2 Vaccination 07/23/2019, 08/20/2019   Pneumococcal Conjugate-13 09/12/2015   Pneumococcal Polysaccharide-23 05/27/2017   Td 08/24/2007   Zoster Recombinat (Shingrix) 09/19/2021   Zoster, Live 11/09/2014    TDAP status: Due, Education has been provided regarding the importance of this vaccine. Advised may receive this vaccine at local pharmacy or Health Dept. Aware to  provide a copy of the vaccination record if obtained from local pharmacy or Health Dept. Verbalized acceptance and understanding.  Flu Vaccine status: Up to date  Pneumococcal vaccine status: Up to date  Covid-19 vaccine status: Declined, Education has been provided regarding the importance of this vaccine but patient still declined. Advised may receive this vaccine at local pharmacy or Health Dept.or vaccine clinic. Aware to provide a copy of the vaccination record if obtained from local pharmacy or Health Dept. Verbalized acceptance and understanding.  Qualifies for Shingles Vaccine? Yes   Zostavax completed No   Shingrix Completed?: No.    Education has been provided regarding the importance of this vaccine. Patient has been advised to call insurance company to determine out of pocket expense if they have not yet received this vaccine. Advised may also receive vaccine at local pharmacy or Health Dept. Verbalized acceptance and understanding.  Screening Tests Health Maintenance  Topic Date Due   Diabetic kidney evaluation - Urine ACR  07/26/2015   DTaP/Tdap/Td (2 - Tdap) 08/23/2017   COVID-19 Vaccine (3 - Moderna risk series) 09/17/2019   Zoster Vaccines- Shingrix (2 of 2) 11/14/2021   OPHTHALMOLOGY EXAM  03/31/2022   FOOT EXAM  05/11/2022   Fecal DNA (Cologuard)  06/29/2022   HEMOGLOBIN A1C  10/19/2022   Diabetic kidney evaluation - eGFR measurement  01/17/2023   INFLUENZA VACCINE  02/13/2023   Medicare Annual Wellness (AWV)  01/01/2024   Pneumonia Vaccine 81+ Years old  Completed   Hepatitis C Screening  Completed   HPV VACCINES  Aged Out   Colonoscopy  Discontinued    Health Maintenance  Health Maintenance Due  Topic Date Due   Diabetic kidney evaluation - Urine ACR  07/26/2015   DTaP/Tdap/Td (2 - Tdap) 08/23/2017   COVID-19 Vaccine (3 - Moderna risk series) 09/17/2019   Zoster Vaccines- Shingrix (2 of 2) 11/14/2021   OPHTHALMOLOGY EXAM  03/31/2022   FOOT EXAM   05/11/2022   Fecal DNA (Cologuard)  06/29/2022   HEMOGLOBIN A1C  10/19/2022   Diabetic kidney evaluation - eGFR measurement  01/17/2023    Colorectal cancer screening: Type of screening: Cologuard. Completed 06/30/19. Repeat every 3 years order placed  Lung Cancer Screening: (Low Dose CT Chest recommended if Age 69-80 years, 20 pack-year currently smoking OR have quit w/in 15years.) does not qualify.   Lung Cancer Screening Referral: no  Additional Screening:  Hepatitis C Screening: does qualify; Completed 11/04/14  Vision Screening: Recommended annual ophthalmology exams for early detection of glaucoma and other disorders of the eye. Is the patient up to date with their annual eye exam?  No patient to call for appt Who is the provider or what is the name of the office in which the patient attends annual eye exams? [facility] If pt is not established with a provider, would they like to be referred  to a provider to establish care? Yes .   Dental Screening: Recommended annual dental exams for proper oral hygiene   Community Resource Referral / Chronic Care Management: CRR required this visit?  No   CCM required this visit?  No     Plan:     I have personally reviewed and noted the following in the patient's chart:   Medical and social history Use of alcohol, tobacco or illicit drugs  Current medications and supplements including opioid prescriptions. Patient is not currently taking opioid prescriptions. Functional ability and status Nutritional status Physical activity Advanced directives List of other physicians Hospitalizations, surgeries, and ER visits in previous 12 months Vitals Screenings to include cognitive, depression, and falls Referrals and appointments  In addition, I have reviewed and discussed with patient certain preventive protocols, quality metrics, and best practice recommendations. A written personalized care plan for preventive services as well as  general preventive health recommendations were provided to patient.     Maryan Puls, LPN   1/61/0960   After Visit Summary: (Declined) Due to this being a telephonic visit, with patients personalized plan was offered to patient but patient Declined AVS at this time   Nurse Notes: Order placed for cologuard. Patient declined covid vaccine. Patient denies being diabetic.

## 2023-01-15 ENCOUNTER — Telehealth: Payer: Self-pay | Admitting: *Deleted

## 2023-01-15 DIAGNOSIS — E1169 Type 2 diabetes mellitus with other specified complication: Secondary | ICD-10-CM

## 2023-01-15 DIAGNOSIS — E1159 Type 2 diabetes mellitus with other circulatory complications: Secondary | ICD-10-CM

## 2023-01-15 NOTE — Telephone Encounter (Signed)
-----   Message from Lovena Neighbours, RT sent at 01/15/2023  1:55 PM EDT ----- Regarding: Labs 7.16.24 Patient is scheduled for CPX labs, please order future labs. Thank you, Denny Peon

## 2023-01-28 ENCOUNTER — Other Ambulatory Visit: Payer: Medicare HMO

## 2023-02-01 DIAGNOSIS — G4733 Obstructive sleep apnea (adult) (pediatric): Secondary | ICD-10-CM | POA: Diagnosis not present

## 2023-02-04 ENCOUNTER — Ambulatory Visit (INDEPENDENT_AMBULATORY_CARE_PROVIDER_SITE_OTHER): Payer: Medicare HMO | Admitting: Family Medicine

## 2023-02-04 ENCOUNTER — Encounter: Payer: Self-pay | Admitting: Family Medicine

## 2023-02-04 VITALS — BP 120/78 | HR 70 | Temp 97.4°F | Ht 66.0 in | Wt 199.1 lb

## 2023-02-04 DIAGNOSIS — Z8546 Personal history of malignant neoplasm of prostate: Secondary | ICD-10-CM

## 2023-02-04 DIAGNOSIS — G4733 Obstructive sleep apnea (adult) (pediatric): Secondary | ICD-10-CM

## 2023-02-04 DIAGNOSIS — E785 Hyperlipidemia, unspecified: Secondary | ICD-10-CM

## 2023-02-04 DIAGNOSIS — E1159 Type 2 diabetes mellitus with other circulatory complications: Secondary | ICD-10-CM | POA: Diagnosis not present

## 2023-02-04 DIAGNOSIS — Z6831 Body mass index (BMI) 31.0-31.9, adult: Secondary | ICD-10-CM

## 2023-02-04 DIAGNOSIS — Z0000 Encounter for general adult medical examination without abnormal findings: Secondary | ICD-10-CM | POA: Diagnosis not present

## 2023-02-04 DIAGNOSIS — R809 Proteinuria, unspecified: Secondary | ICD-10-CM

## 2023-02-04 DIAGNOSIS — E1169 Type 2 diabetes mellitus with other specified complication: Secondary | ICD-10-CM

## 2023-02-04 DIAGNOSIS — E6609 Other obesity due to excess calories: Secondary | ICD-10-CM

## 2023-02-04 DIAGNOSIS — I152 Hypertension secondary to endocrine disorders: Secondary | ICD-10-CM | POA: Diagnosis not present

## 2023-02-04 LAB — COMPREHENSIVE METABOLIC PANEL
ALT: 12 U/L (ref 0–53)
AST: 18 U/L (ref 0–37)
Albumin: 4.1 g/dL (ref 3.5–5.2)
Alkaline Phosphatase: 63 U/L (ref 39–117)
BUN: 12 mg/dL (ref 6–23)
CO2: 28 mEq/L (ref 19–32)
Calcium: 9.1 mg/dL (ref 8.4–10.5)
Chloride: 107 mEq/L (ref 96–112)
Creatinine, Ser: 1.14 mg/dL (ref 0.40–1.50)
GFR: 64.05 mL/min (ref >60.00)
Glucose, Bld: 91 mg/dL (ref 70–99)
Potassium: 4.3 mEq/L (ref 3.5–5.1)
Sodium: 141 mEq/L (ref 135–145)
Total Bilirubin: 0.6 mg/dL (ref 0.2–1.2)
Total Protein: 7.4 g/dL (ref 6.0–8.3)

## 2023-02-04 LAB — HEMOGLOBIN A1C: Hgb A1c MFr Bld: 6.2 % (ref 4.6–6.5)

## 2023-02-04 LAB — MICROALBUMIN / CREATININE URINE RATIO
Creatinine,U: 152.4 mg/dL
Microalb Creat Ratio: 0.7 mg/g (ref 0.0–30.0)
Microalb, Ur: 1.1 mg/dL (ref 0.0–1.9)

## 2023-02-04 LAB — HM DIABETES FOOT EXAM

## 2023-02-04 LAB — PSA: PSA: 0.01 ng/mL — ABNORMAL LOW (ref 0.10–4.00)

## 2023-02-04 LAB — LIPID PANEL
Cholesterol: 130 mg/dL (ref 0–200)
HDL: 35.9 mg/dL — ABNORMAL LOW (ref >39.00)
LDL Cholesterol: 80 mg/dL (ref 0–99)
NonHDL: 94.24
Total CHOL/HDL Ratio: 4
Triglycerides: 73 mg/dL (ref 0.0–149.0)
VLDL: 14.6 mg/dL (ref 0.0–40.0)

## 2023-02-04 NOTE — Assessment & Plan Note (Signed)
Encouraged exercise, weight loss, healthy eating habits. ? ?

## 2023-02-04 NOTE — Patient Instructions (Addendum)
Please stop at the lab to have labs drawn.  Call Dr. Laverle Patter to set up prostate cancer follow up.

## 2023-02-04 NOTE — Assessment & Plan Note (Signed)
Due for re-eval. LDL previously at goal < 100 on  atorvastatin 10 mg daily

## 2023-02-04 NOTE — Assessment & Plan Note (Signed)
Due for re-eval. 

## 2023-02-04 NOTE — Assessment & Plan Note (Signed)
Stable, chronic.  Continue current medication.  Losartan 50 mg p.o. daily

## 2023-02-04 NOTE — Progress Notes (Signed)
Patient ID: Chase Moreno, male    DOB: 11-29-1950, 72 y.o.   MRN: [id number]  This visit was conducted in person.  BP 120/78 (BP Location: Left Arm, Patient Position: Sitting, Cuff Size: Large)   Pulse 70   Temp (!) 97.4 F (36.3 C) (Temporal)   Ht 5\' 6"  (1.676 m)   Wt 199 lb 2 oz (90.3 kg)   SpO2 97%   BMI 32.14 kg/m    CC:  Chief Complaint  Patient presents with   Annual Exam    Part 2    Subjective:   HPI: Chase Moreno is a 72 y.o. male presenting on 02/04/2023 for Annual Exam (Part 2)  The patient presents for  complete physical and review of chronic health problems. He/She also has the following acute concerns today:   The patient saw a LPN or RN for medicare wellness visit.01/01/2023  Prevention and wellness was reviewed in detail. Note reviewed and important notes copied below.  Elevated Cholesterol:  LDL previously at goal < 100 on  atorvastatin 10 mg daily in past.. due for re-eval. Lab Results  Component Value Date   CHOL 152 01/16/2022   HDL 48.70 01/16/2022   LDLCALC 91 01/16/2022   LDLDIRECT 169.8 02/24/2012   TRIG 61.0 01/16/2022   CHOLHDL 3 01/16/2022  Using medications without problems: none Muscle aches:  none Diet compliance: healthy eating.. fair Exercise: walking daily, very active Other complaints:  Hypertension:  At goal on losartan 50 mg p.o. daily BP Readings from Last 3 Encounters:  02/04/23 120/78  08/08/22 134/78  04/19/22 (!) 136/90  Using medication without problems or lightheadedness:  none Chest pain with exertion: none Edema: none Short of breath: none Average home BPs: Other issues:  Diabetes: Diet controlled in past.. due for re-eval. Lab Results  Component Value Date   HGBA1C 5.6 04/19/2022  Using medications without difficulties: Hypoglycemic episodes: Hyperglycemic episodes: Feet problems: no ulcers Blood Sugars averaging: eye exam within last year:due  Obesity causing OSA and DM.. stable on CPAP    Wt  Readings from Last 3 Encounters:  02/04/23 199 lb 2 oz (90.3 kg)  01/01/23 200 lb (90.7 kg)  08/08/22 205 lb 2 oz (93 kg)     Relevant past medical, surgical, family and social history reviewed and updated as indicated. Interim medical history since our last visit reviewed. Allergies and medications reviewed and updated. Outpatient Medications Prior to Visit  Medication Sig Dispense Refill   Ascorbic Acid (VITAMIN C PO) Take 1 tablet by mouth daily.     aspirin EC 81 MG tablet Take 81 mg by mouth daily. Swallow whole.     atorvastatin (LIPITOR) 10 MG tablet Take 1 tablet by mouth once daily 90 tablet 3   losartan (COZAAR) 50 MG tablet Take 1 tablet by mouth once daily 90 tablet 2   VITAMIN D PO Take 1 tablet by mouth daily.     No facility-administered medications prior to visit.     Per HPI unless specifically indicated in ROS section below Review of Systems  Constitutional:  Negative for fatigue and fever.  HENT:  Negative for ear pain.   Eyes:  Negative for pain.  Respiratory:  Negative for cough and shortness of breath.   Cardiovascular:  Negative for chest pain, palpitations and leg swelling.  Gastrointestinal:  Negative for abdominal pain.  Genitourinary:  Negative for dysuria.  Musculoskeletal:  Negative for arthralgias.  Neurological:  Negative for syncope, light-headedness and headaches.  Psychiatric/Behavioral:  Negative for dysphoric mood.    Objective:  BP 120/78 (BP Location: Left Arm, Patient Position: Sitting, Cuff Size: Large)   Pulse 70   Temp (!) 97.4 F (36.3 C) (Temporal)   Ht 5\' 6"  (1.676 m)   Wt 199 lb 2 oz (90.3 kg)   SpO2 97%   BMI 32.14 kg/m   Wt Readings from Last 3 Encounters:  02/04/23 199 lb 2 oz (90.3 kg)  01/01/23 200 lb (90.7 kg)  08/08/22 205 lb 2 oz (93 kg)      Physical Exam Constitutional:      Appearance: He is well-developed.  HENT:     Head: Normocephalic.     Right Ear: Hearing normal.     Left Ear: Hearing normal.      Nose: Nose normal.  Neck:     Thyroid: No thyroid mass or thyromegaly.     Vascular: No carotid bruit.     Trachea: Trachea normal.  Cardiovascular:     Rate and Rhythm: Normal rate and regular rhythm.     Pulses: Normal pulses.     Heart sounds: Heart sounds not distant. No murmur heard.    No friction rub. No gallop.     Comments: No peripheral edema Pulmonary:     Effort: Pulmonary effort is normal. No respiratory distress.     Breath sounds: Normal breath sounds.  Skin:    General: Skin is warm and dry.     Findings: No rash.  Psychiatric:        Speech: Speech normal.        Behavior: Behavior normal.        Thought Content: Thought content normal.    Diabetic foot exam: Normal inspection No skin breakdown No calluses  Normal DP pulses Normal sensation to light touch and monofilament Nails normal     Results for orders placed or performed in visit on 02/04/23  HM DIABETES FOOT EXAM  Result Value Ref Range   HM Diabetic Foot Exam done      COVID 19 screen:  No recent travel or known exposure to COVID19 The patient denies respiratory symptoms of COVID 19 at this time. The importance of social distancing was discussed today.   Assessment and Plan   The patient's preventative maintenance and recommended screening tests for an annual wellness exam were reviewed in full today. Brought up to date unless services declined.  Counselled on the importance of diet, exercise, and its role in overall health and mortality. The patient's FH and SH was reviewed, including their home life, tobacco status, and drug and alcohol status.   Vaccines: uptodate flu and  PNA,  due for td and new shingles vaccine ( refused).  Discussed COVID19 vaccine side effects and benefits. Strongly encouraged the patient to get the vaccine. Questions answered... discuss booster. Colon: 10/09/2007 Dr. Russella Dar, polyps,  Cologuard 2020 negative... repeat in 3 years.. sent off today. Hep C: neg   Nonsmoker Prostate:  02/2017 S/P  Robotic assisted prostatectomy followed by Dr. Laverle Patter.  Yearly visit in past... none  since 2022.Marland Kitchen asked pt to call to set up follow up, will check PSA with today's labs.  ETOH: none    Problem List Items Addressed This Visit     Class 1 obesity due to excess calories with serious comorbidity and body mass index (BMI) of 31.0 to 31.9 in adult    Encouraged exercise, weight loss, healthy eating habits.       History of prostate  cancer     Followed by Urology Dr. Laverle Patter. S/p prostatectomy  He has not seen Dr. Laverle Patter since 2022.  I will check a PSA today and ask him to call Dr. Vevelyn Royals office to set up a follow-up.      Relevant Orders   PSA   Hyperlipidemia associated with type 2 diabetes mellitus (HCC)    Due for re-eval. LDL previously at goal < 100 on  atorvastatin 10 mg daily      Hypertension associated with diabetes (HCC)    Stable, chronic.  Continue current medication.   Losartan 50 mg p.o. daily      Microalbuminuria    Due for re-eval.      OSA (obstructive sleep apnea)    stable on CPAP      Type 2 diabetes mellitus with other circulatory complications HTN (HCC)    Chronic, improving control with lifestyle.  We discussed increasing exercise and he will be getting back to hunting soon.      Other Visit Diagnoses     Routine general medical examination at a health care facility    -  Primary      Orders Placed This Encounter  Procedures   PSA   HM DIABETES FOOT EXAM    This external order was created through the Results Console.     Kerby Nora, MD

## 2023-02-04 NOTE — Assessment & Plan Note (Addendum)
Followed by Urology Dr. Laverle Patter. S/p prostatectomy  He has not seen Dr. Laverle Patter since 2022.  I will check a PSA today and ask him to call Dr. Vevelyn Royals office to set up a follow-up.

## 2023-02-04 NOTE — Assessment & Plan Note (Signed)
stable on CPAP

## 2023-02-04 NOTE — Assessment & Plan Note (Signed)
Chronic, improving control with lifestyle.  We discussed increasing exercise and he will be getting back to hunting soon.

## 2023-02-05 ENCOUNTER — Telehealth: Payer: Self-pay | Admitting: Family Medicine

## 2023-02-05 ENCOUNTER — Encounter: Payer: Self-pay | Admitting: *Deleted

## 2023-02-05 NOTE — Telephone Encounter (Signed)
Lab results and Dr. Daphine Deutscher comments discussed with patient via telephone.  Letter with results also mailed to patient for his records.  See result note.

## 2023-02-05 NOTE — Telephone Encounter (Signed)
Patient called in returning call he received. Thank you!

## 2023-02-20 DIAGNOSIS — Z1211 Encounter for screening for malignant neoplasm of colon: Secondary | ICD-10-CM | POA: Diagnosis not present

## 2023-03-04 DIAGNOSIS — G4733 Obstructive sleep apnea (adult) (pediatric): Secondary | ICD-10-CM | POA: Diagnosis not present

## 2023-03-28 ENCOUNTER — Other Ambulatory Visit: Payer: Self-pay | Admitting: Family Medicine

## 2023-04-04 DIAGNOSIS — G4733 Obstructive sleep apnea (adult) (pediatric): Secondary | ICD-10-CM | POA: Diagnosis not present

## 2023-05-02 DIAGNOSIS — G4733 Obstructive sleep apnea (adult) (pediatric): Secondary | ICD-10-CM | POA: Diagnosis not present

## 2023-05-19 DIAGNOSIS — H52222 Regular astigmatism, left eye: Secondary | ICD-10-CM | POA: Diagnosis not present

## 2023-05-19 DIAGNOSIS — H25813 Combined forms of age-related cataract, bilateral: Secondary | ICD-10-CM | POA: Diagnosis not present

## 2023-05-19 DIAGNOSIS — H5203 Hypermetropia, bilateral: Secondary | ICD-10-CM | POA: Diagnosis not present

## 2023-06-02 DIAGNOSIS — G4733 Obstructive sleep apnea (adult) (pediatric): Secondary | ICD-10-CM | POA: Diagnosis not present

## 2023-06-30 ENCOUNTER — Telehealth: Payer: Self-pay | Admitting: Family Medicine

## 2023-06-30 MED ORDER — ATORVASTATIN CALCIUM 10 MG PO TABS: 10.0000 mg | ORAL_TABLET | Freq: Every day | ORAL | 1 refills | Status: DC

## 2023-06-30 NOTE — Telephone Encounter (Signed)
Refill sent as requested. 

## 2023-06-30 NOTE — Telephone Encounter (Signed)
Prescription Request  06/30/2023  LOV: 02/04/2023  What is the name of the medication or equipment? atorvastatin (LIPITOR) 10 MG tablet   Have you contacted your pharmacy to request a refill? Yes pt's insurance called on his behalf   Which pharmacy would you like this sent to?  [facility] [address] [place] - [address] [place] [address] Phone: [phone] Fax: [phone]    Patient notified that their request is being sent to the clinical staff for review and that they should receive a response within 2 business days.   Please advise at [phone]  [employer], called for refill. [employer] requested a 90 day supply be prescribed

## 2023-07-02 DIAGNOSIS — G4733 Obstructive sleep apnea (adult) (pediatric): Secondary | ICD-10-CM | POA: Diagnosis not present

## 2023-09-21 ENCOUNTER — Other Ambulatory Visit: Payer: Self-pay | Admitting: Family Medicine

## 2023-10-02 DIAGNOSIS — H25811 Combined forms of age-related cataract, right eye: Secondary | ICD-10-CM | POA: Diagnosis not present

## 2023-12-03 DIAGNOSIS — H5711 Ocular pain, right eye: Secondary | ICD-10-CM | POA: Diagnosis not present

## 2023-12-03 DIAGNOSIS — H5789 Other specified disorders of eye and adnexa: Secondary | ICD-10-CM | POA: Diagnosis not present

## 2023-12-19 DIAGNOSIS — J069 Acute upper respiratory infection, unspecified: Secondary | ICD-10-CM | POA: Diagnosis not present

## 2023-12-19 DIAGNOSIS — J209 Acute bronchitis, unspecified: Secondary | ICD-10-CM | POA: Diagnosis not present

## 2023-12-21 ENCOUNTER — Other Ambulatory Visit: Payer: Self-pay

## 2023-12-21 ENCOUNTER — Emergency Department (HOSPITAL_COMMUNITY): Payer: Medicare (Managed Care)

## 2023-12-21 ENCOUNTER — Inpatient Hospital Stay (HOSPITAL_COMMUNITY)
Admission: EM | Admit: 2023-12-21 | Discharge: 2023-12-23 | DRG: 871 | Disposition: A | Discharge status: Home / Self Care | Payer: Medicare (Managed Care) | Attending: Family Medicine | Admitting: Family Medicine

## 2023-12-21 DIAGNOSIS — E1159 Type 2 diabetes mellitus with other circulatory complications: Secondary | ICD-10-CM | POA: Diagnosis not present

## 2023-12-21 DIAGNOSIS — Z7982 Long term (current) use of aspirin: Secondary | ICD-10-CM

## 2023-12-21 DIAGNOSIS — E7800 Pure hypercholesterolemia, unspecified: Secondary | ICD-10-CM | POA: Diagnosis present

## 2023-12-21 DIAGNOSIS — Z79899 Other long term (current) drug therapy: Secondary | ICD-10-CM | POA: Diagnosis not present

## 2023-12-21 DIAGNOSIS — Z6831 Body mass index (BMI) 31.0-31.9, adult: Secondary | ICD-10-CM | POA: Diagnosis not present

## 2023-12-21 DIAGNOSIS — W57XXXA Bitten or stung by nonvenomous insect and other nonvenomous arthropods, initial encounter: Secondary | ICD-10-CM | POA: Diagnosis present

## 2023-12-21 DIAGNOSIS — I152 Hypertension secondary to endocrine disorders: Secondary | ICD-10-CM | POA: Diagnosis present

## 2023-12-21 DIAGNOSIS — A419 Sepsis, unspecified organism: Secondary | ICD-10-CM | POA: Diagnosis not present

## 2023-12-21 DIAGNOSIS — J9600 Acute respiratory failure, unspecified whether with hypoxia or hypercapnia: Secondary | ICD-10-CM | POA: Diagnosis not present

## 2023-12-21 DIAGNOSIS — R6520 Severe sepsis without septic shock: Secondary | ICD-10-CM | POA: Diagnosis not present

## 2023-12-21 DIAGNOSIS — Z87891 Personal history of nicotine dependence: Secondary | ICD-10-CM | POA: Diagnosis not present

## 2023-12-21 DIAGNOSIS — Z1152 Encounter for screening for COVID-19: Secondary | ICD-10-CM | POA: Diagnosis not present

## 2023-12-21 DIAGNOSIS — E66811 Obesity, class 1: Secondary | ICD-10-CM | POA: Diagnosis not present

## 2023-12-21 DIAGNOSIS — Z8546 Personal history of malignant neoplasm of prostate: Secondary | ICD-10-CM

## 2023-12-21 DIAGNOSIS — R000 Tachycardia, unspecified: Secondary | ICD-10-CM | POA: Diagnosis not present

## 2023-12-21 DIAGNOSIS — Z823 Family history of stroke: Secondary | ICD-10-CM

## 2023-12-21 DIAGNOSIS — J189 Pneumonia, unspecified organism: Secondary | ICD-10-CM | POA: Diagnosis not present

## 2023-12-21 DIAGNOSIS — Z8261 Family history of arthritis: Secondary | ICD-10-CM | POA: Diagnosis not present

## 2023-12-21 LAB — CBC WITH DIFFERENTIAL/PLATELET
Abs Immature Granulocytes: 0.19 10*3/uL — ABNORMAL HIGH (ref 0.00–0.07)
Basophils Absolute: 0 10*3/uL (ref 0.0–0.1)
Basophils Relative: 0 %
Eosinophils Absolute: 0 10*3/uL (ref 0.0–0.5)
Eosinophils Relative: 0 %
HCT: 36.7 % — ABNORMAL LOW (ref 39.0–52.0)
Hemoglobin: 12.3 g/dL — ABNORMAL LOW (ref 13.0–17.0)
Immature Granulocytes: 1 %
Lymphocytes Relative: 4 %
Lymphs Abs: 0.6 10*3/uL — ABNORMAL LOW (ref 0.7–4.0)
MCH: 27.9 pg (ref 26.0–34.0)
MCHC: 33.5 g/dL (ref 30.0–36.0)
MCV: 83.2 fL (ref 80.0–100.0)
Monocytes Absolute: 2.3 10*3/uL — ABNORMAL HIGH (ref 0.1–1.0)
Monocytes Relative: 13 %
Neutro Abs: 14.6 10*3/uL — ABNORMAL HIGH (ref 1.7–7.7)
Neutrophils Relative %: 82 %
Platelets: 275 10*3/uL (ref 150–400)
RBC: 4.41 MIL/uL (ref 4.22–5.81)
RDW: 14.9 % (ref 11.5–15.5)
WBC: 17.7 10*3/uL — ABNORMAL HIGH (ref 4.0–10.5)
nRBC: 0 % (ref 0.0–0.2)

## 2023-12-21 LAB — COMPREHENSIVE METABOLIC PANEL WITH GFR
ALT: 35 U/L (ref 0–44)
AST: 69 U/L — ABNORMAL HIGH (ref 15–41)
Albumin: 2.8 g/dL — ABNORMAL LOW (ref 3.5–5.0)
Alkaline Phosphatase: 70 U/L (ref 38–126)
Anion gap: 10 (ref 5–15)
BUN: 16 mg/dL (ref 8–23)
CO2: 22 mmol/L (ref 22–32)
Calcium: 8.2 mg/dL — ABNORMAL LOW (ref 8.9–10.3)
Chloride: 100 mmol/L (ref 98–111)
Creatinine, Ser: 1.23 mg/dL (ref 0.61–1.24)
GFR, Estimated: >60 mL/min (ref >60)
Glucose, Bld: 127 mg/dL — ABNORMAL HIGH (ref 70–99)
Potassium: 3.7 mmol/L (ref 3.5–5.1)
Sodium: 132 mmol/L — ABNORMAL LOW (ref 135–145)
Total Bilirubin: 0.6 mg/dL (ref 0.0–1.2)
Total Protein: 7.5 g/dL (ref 6.5–8.1)

## 2023-12-21 LAB — URINALYSIS, W/ REFLEX TO CULTURE (INFECTION SUSPECTED)
Bilirubin Urine: NEGATIVE
Glucose, UA: NEGATIVE mg/dL
Ketones, ur: NEGATIVE mg/dL
Leukocytes,Ua: NEGATIVE
Nitrite: NEGATIVE
Protein, ur: 100 mg/dL — AB
Specific Gravity, Urine: 1.026 (ref 1.005–1.030)
pH: 5 (ref 5.0–8.0)

## 2023-12-21 LAB — PROTIME-INR
INR: 1.2 (ref 0.8–1.2)
Prothrombin Time: 15.8 s — ABNORMAL HIGH (ref 11.4–15.2)

## 2023-12-21 LAB — RESP PANEL BY RT-PCR (RSV, FLU A&B, COVID)  RVPGX2
Influenza A by PCR: NEGATIVE
Influenza B by PCR: NEGATIVE
Resp Syncytial Virus by PCR: NEGATIVE
SARS Coronavirus 2 by RT PCR: NEGATIVE

## 2023-12-21 LAB — LACTIC ACID, PLASMA: Lactic Acid, Venous: 1.2 mmol/L (ref 0.5–1.9)

## 2023-12-21 MED ORDER — ENOXAPARIN SODIUM 40 MG/0.4ML IJ SOSY
40.0000 mg | PREFILLED_SYRINGE | INTRAMUSCULAR | Status: DC
  Administered 2023-12-21 – 2023-12-22 (×2): 40 mg via SUBCUTANEOUS
  Filled 2023-12-21 (×2): qty 0.4

## 2023-12-21 MED ORDER — LACTATED RINGERS IV SOLN: INTRAVENOUS | Status: AC

## 2023-12-21 MED ORDER — GUAIFENESIN-DM 100-10 MG/5ML PO SYRP
15.0000 mL | ORAL_SOLUTION | Freq: Three times a day (TID) | ORAL | Status: AC
  Administered 2023-12-21 – 2023-12-22 (×3): 15 mL via ORAL
  Filled 2023-12-21 (×3): qty 15

## 2023-12-21 MED ORDER — LOSARTAN POTASSIUM 25 MG PO TABS
50.0000 mg | ORAL_TABLET | Freq: Every day | ORAL | Status: DC
  Administered 2023-12-22 – 2023-12-23 (×2): 50 mg via ORAL
  Filled 2023-12-21 (×2): qty 2

## 2023-12-21 MED ORDER — SODIUM CHLORIDE 0.9 % IV SOLN
2.0000 g | INTRAVENOUS | Status: DC
  Administered 2023-12-22: 2 g via INTRAVENOUS
  Filled 2023-12-21: qty 20

## 2023-12-21 MED ORDER — ACETAMINOPHEN 325 MG PO TABS
650.0000 mg | ORAL_TABLET | Freq: Four times a day (QID) | ORAL | Status: DC | PRN
  Administered 2023-12-22 (×2): 650 mg via ORAL
  Filled 2023-12-21 (×2): qty 2

## 2023-12-21 MED ORDER — ACETAMINOPHEN 500 MG PO TABS
1000.0000 mg | ORAL_TABLET | Freq: Once | ORAL | Status: AC
  Administered 2023-12-21: 1000 mg via ORAL
  Filled 2023-12-21: qty 2

## 2023-12-21 MED ORDER — LACTATED RINGERS IV SOLN
INTRAVENOUS | Status: DC
Start: 2023-12-21 — End: 2023-12-21

## 2023-12-21 MED ORDER — SODIUM CHLORIDE 0.9 % IV BOLUS
1000.0000 mL | Freq: Once | INTRAVENOUS | Status: AC
  Administered 2023-12-21: 1000 mL via INTRAVENOUS

## 2023-12-21 MED ORDER — ONDANSETRON HCL 4 MG/2ML IJ SOLN: 4.0000 mg | Freq: Four times a day (QID) | INTRAMUSCULAR | Status: DC | PRN

## 2023-12-21 MED ORDER — ASPIRIN 81 MG PO TBEC
81.0000 mg | DELAYED_RELEASE_TABLET | Freq: Every day | ORAL | Status: DC
  Administered 2023-12-21 – 2023-12-23 (×3): 81 mg via ORAL
  Filled 2023-12-21 (×3): qty 1

## 2023-12-21 MED ORDER — SODIUM CHLORIDE 0.9 % IV SOLN
2.0000 g | Freq: Once | INTRAVENOUS | Status: AC
  Administered 2023-12-21: 2 g via INTRAVENOUS
  Filled 2023-12-21: qty 20

## 2023-12-21 MED ORDER — SODIUM CHLORIDE 0.9 % IV SOLN
500.0000 mg | INTRAVENOUS | Status: DC
  Administered 2023-12-22: 500 mg via INTRAVENOUS
  Filled 2023-12-21: qty 5

## 2023-12-21 MED ORDER — SODIUM CHLORIDE 0.9 % IV SOLN
500.0000 mg | Freq: Once | INTRAVENOUS | Status: AC
  Administered 2023-12-21: 500 mg via INTRAVENOUS
  Filled 2023-12-21: qty 5

## 2023-12-21 MED ORDER — ONDANSETRON HCL 4 MG PO TABS: 4.0000 mg | ORAL_TABLET | Freq: Four times a day (QID) | ORAL | Status: DC | PRN

## 2023-12-21 MED ORDER — ENSURE PLUS HIGH PROTEIN PO LIQD
237.0000 mL | Freq: Two times a day (BID) | ORAL | Status: DC
  Administered 2023-12-22 – 2023-12-23 (×3): 237 mL via ORAL

## 2023-12-21 MED ORDER — ACETAMINOPHEN 650 MG RE SUPP: 650.0000 mg | Freq: Four times a day (QID) | RECTAL | Status: DC | PRN

## 2023-12-21 MED ORDER — POLYETHYLENE GLYCOL 3350 17 G PO PACK: 17.0000 g | PACK | Freq: Every day | ORAL | Status: DC | PRN

## 2023-12-21 NOTE — Assessment & Plan Note (Signed)
 Controlled.  Not on medication.  Last A1c 01/2023 was 6.2.

## 2023-12-21 NOTE — Assessment & Plan Note (Signed)
Stable. -Resume losartan

## 2023-12-21 NOTE — ED Triage Notes (Signed)
 Pt with fatigue for past week, cough, loss of appetite. ? Fever + chills

## 2023-12-21 NOTE — ED Notes (Signed)
 Sepsis starts at 1644 and and sepsis ends at 1727

## 2023-12-21 NOTE — ED Provider Notes (Signed)
 [place] EMERGENCY DEPARTMENT AT [facility] Provider Note   CSN: [id number] Arrival date & time: 12/21/23  1609     History  Chief Complaint  Patient presents with   Fatigue    Chase Moreno is a 73 y.o. male.  HPI   This patient is a 73 year old male who has a history of hypertension on losartan , high cholesterol on Lipitor and was placed on cefdinir to treat "a virus" a couple of days ago at the urgent care.  He reports that for about a week he has had increasing fatigue and loss of appetite, he has had some significant nausea after eating but denies abdominal pain, denies chest pain, he is not short of breath but states he has had some progressive cough over the week.  He denies swelling of his legs, he did have a tick on him about a week ago but denies any rashes.  No diarrhea, no headache, no neck stiffness, no sore throat.  His weakness is progressive and when he arrives today he is both febrile and tachycardic  Home Medications Prior to Admission medications   Medication Sig Start Date End Date Taking? Authorizing Provider  Ascorbic Acid (VITAMIN C PO) Take 1 tablet by mouth daily.    [provider]  aspirin EC 81 MG tablet Take 81 mg by mouth daily. Swallow whole.    [provider]  atorvastatin  (LIPITOR) 10 MG tablet Take 1 tablet (10 mg total) by mouth daily. 06/30/23   Judithann Novas, MD  losartan  (COZAAR ) 50 MG tablet Take 1 tablet by mouth once daily 09/22/23   Bedsole, Amy E, MD  VITAMIN D PO Take 1 tablet by mouth daily.    [provider]      Allergies    Patient has no known allergies.    Review of Systems   Review of Systems  All other systems reviewed and are negative.   Physical Exam Updated Vital Signs BP 136/78   Pulse (!) 112   Temp (!) 103.2 F (39.6 C) (Oral)   Resp 20   Ht 1.676 m (5\' 6" )   Wt 75.3 kg   SpO2 91%   BMI 26.79 kg/m  Physical Exam Vitals and nursing note reviewed.  Constitutional:       General: He is in acute distress.     Appearance: He is well-developed. He is ill-appearing.  HENT:     Head: Normocephalic and atraumatic.     Nose: No congestion or rhinorrhea.     Mouth/Throat:     Pharynx: No oropharyngeal exudate.  Eyes:     General: No scleral icterus.       Right eye: No discharge.        Left eye: No discharge.     Conjunctiva/sclera: Conjunctivae normal.     Pupils: Pupils are equal, round, and reactive to light.  Neck:     Thyroid: No thyromegaly.     Vascular: No JVD.  Cardiovascular:     Rate and Rhythm: Regular rhythm. Tachycardia present.     Heart sounds: Normal heart sounds. No murmur heard.    No friction rub. No gallop.  Pulmonary:     Effort: Pulmonary effort is normal. No respiratory distress.     Breath sounds: No wheezing or rales.  Abdominal:     General: Bowel sounds are normal. There is no distension.     Palpations: Abdomen is soft. There is no mass.     Tenderness:  There is no abdominal tenderness.  Musculoskeletal:        General: No tenderness. Normal range of motion.     Cervical back: Normal range of motion and neck supple.     Right lower leg: Edema present.     Left lower leg: Edema present.     Comments: Symmetrical scant pitting edema of the lower extremities in the pretibial region  Lymphadenopathy:     Cervical: No cervical adenopathy.  Skin:    General: Skin is warm and dry.     Findings: No erythema or rash.  Neurological:     General: No focal deficit present.     Mental Status: He is alert.     Coordination: Coordination normal.  Psychiatric:        Behavior: Behavior normal.     ED Results / Procedures / Treatments   Labs (all labs ordered are listed, but only abnormal results are displayed) Labs Reviewed  CBC WITH DIFFERENTIAL/PLATELET - Abnormal; Notable for the following components:      Result Value   WBC 17.7 (*)    Hemoglobin 12.3 (*)    HCT 36.7 (*)    Neutro Abs 14.6 (*)    Lymphs Abs 0.6 (*)     Monocytes Absolute 2.3 (*)    Abs Immature Granulocytes 0.19 (*)    All other components within normal limits  CULTURE, BLOOD (ROUTINE X 2)  CULTURE, BLOOD (ROUTINE X 2)  RESP PANEL BY RT-PCR (RSV, FLU A&B, COVID)  [id number]  COMPREHENSIVE METABOLIC PANEL WITH GFR  LACTIC ACID, PLASMA  LACTIC ACID, PLASMA  PROTIME-INR  URINALYSIS, W/ REFLEX TO CULTURE (INFECTION SUSPECTED)    EKG EKG Interpretation Date/Time:  Sunday December 21 2023 16:53:29 EDT Ventricular Rate:  100 PR Interval:  152 QRS Duration:  81 QT Interval:  332 QTC Calculation: 429 R Axis:   51  Text Interpretation: Sinus tachycardia Probable left atrial enlargement Confirmed by Early Glisson ([id number]) on 12/21/2023 4:58:05 PM  Radiology DG Chest Port 1 View Result Date: 12/21/2023 CLINICAL DATA:  Sepsis EXAM: PORTABLE CHEST 1 VIEW COMPARISON:  Chest x-ray performed October 03, 2015 FINDINGS: A peripheral airspace opacity is present in the right mid and lower lung zone. There is blunting of the right costophrenic angle. Low lung volumes. Thoracic aorta is ectatic. IMPRESSION: 1. Peripheral airspace opacity in the right mid and lower lung zones. Differential considerations include pneumonia. Possible superimposed small right pleural effusion. Recommend follow-up radiograph to demonstrate resolution. Electronically Signed   By: Reagan Camera M.D.   On: 12/21/2023 16:51    Procedures .Critical Care  Performed by: Early Glisson, MD Authorized by: Early Glisson, MD   Critical care provider statement:    Critical care time (minutes):  45   Critical care time was exclusive of:  Separately billable procedures and treating other patients   Critical care was necessary to treat or prevent imminent or life-threatening deterioration of the following conditions:  Respiratory failure and sepsis   Critical care was time spent personally by me on the following activities:  Development of treatment plan with patient or surrogate, discussions with  consultants, evaluation of patient's response to treatment, examination of patient, obtaining history from patient or surrogate, review of old charts, re-evaluation of patient's condition, pulse oximetry, ordering and review of radiographic studies, ordering and review of laboratory studies and ordering and performing treatments and interventions   I assumed direction of critical care for this patient from another provider in my specialty:  no     Care discussed with: admitting provider   Comments:           Medications Ordered in ED Medications  lactated ringers  infusion (has no administration in time range)  cefTRIAXone  (ROCEPHIN ) 2 g in sodium chloride  0.9 % 100 mL IVPB (has no administration in time range)  azithromycin (ZITHROMAX) 500 mg in sodium chloride  0.9 % 250 mL IVPB (has no administration in time range)  sodium chloride  0.9 % bolus 1,000 mL (has no administration in time range)    ED Course/ Medical Decision Making/ A&P                                 Medical Decision Making Amount and/or Complexity of Data Reviewed Labs: ordered. Radiology: ordered.  Risk Prescription drug management. Decision regarding hospitalization.  Fever  This patient presents to the ED for concern of fever and tachycardia, this involves an extensive number of treatment options, and is a complaint that carries with it a high risk of complications and morbidity.  The differential diagnosis includes sepsis syndrome   Co morbidities / Chronic conditions that complicate the patient evaluation  Hypertension, elderly   Additional history obtained:  Additional history obtained from EMR External records from outside source obtained and reviewed including medical record No recent admissions to the hospital, he had been seen for possible type 2 diabetes and hypertension in the office as an outpatient but currently only takes antihypertensive medications   Lab Tests:  I Ordered, and personally  interpreted labs.  The pertinent results include: Leukocytosis of 17,000   Imaging Studies ordered:  I ordered imaging studies including chest x-ray I independently visualized and interpreted imaging which showed right-sided infiltrate consistent with pneumonia I agree with the radiologist interpretation   Cardiac Monitoring: / EKG:  The patient was maintained on a cardiac monitor.  I personally viewed and interpreted the cardiac monitored which showed an underlying rhythm of: Tachycardia   Problem List / ED Course / Critical interventions / Medication management  Septic with what appears to be pneumonia, he is febrile tachycardic, slightly hypoxic at 91% which makes him severely septic with some endorgan dysfunction due to respiratory failure.  He has been given antibiotics and IV fluids, lactate pending I ordered medication including Rocephin , Zithromax, IV fluids Reevaluation of the patient after these medicines showed that the patient tachycardia slightly improving, Tylenol  given for fever I have reviewed the patients home medicines and have made adjustments as needed   Consultations Obtained:  I requested consultation with the hospitalist,  and discussed lab and imaging findings as well as pertinent plan - they recommend: Patient   Social Determinants of Health:  Septic   Test / Admission - Considered:  The high level of care         Final Clinical Impression(s) / ED Diagnoses Final diagnoses:  Sepsis with acute respiratory failure without septic shock, due to unspecified organism, unspecified whether hypoxia or hypercapnia present [facility])    Rx / DC Orders ED Discharge Orders     None         Early Glisson, MD 12/21/23 1700

## 2023-12-21 NOTE — Assessment & Plan Note (Addendum)
 Meet sepsis criteria with fever of 103.2, tachycardia, heart rate 94-112, with leukocytosis of 17.7.  Lactic acid 1.2.  O2 sats 91 to 96% on room air.  COVID influenza RSV negative.  UA with rare bacteria not suggestive of infection. Chest x-ray with right middle and right lower lobe pneumonia. -IV ceftriaxone  and azithromycin -Mucolytics -1 L bolus given, cont N/s 100cc/hr x 10hrs - Follow-up blood cultures

## 2023-12-21 NOTE — H&P (Signed)
 History and Physical    Cameren Earnest ZOX:[id number] DOB: Nov 18, 1950 DOA: 12/21/2023  PCP: Judithann Novas, MD   Patient coming from: Home  I have personally briefly reviewed patient's old medical records in [facility]  Chief Complaint: Fever, cough  HPI: Halston Kintz is a 73 y.o. male with medical history significant for diabetes melitis, hypertension.  Patient presented to the ED with complaints of cough, decreased appetite, fever and chills over the past week.  He denies difficulty breathing, no lower extremity swelling, no chest pain.  He reported a tick bite about a week ago, but no rash. He went to an urgent care- 6/6, and was started on cefdinir 300 twice daily.  He took 4 doses of this.  ED Course: Tmax 103.2.  Tachycardic, heart rate 83-112.  Respiratory 20 - 22.  O2 sats 86% on room air.   WBC 17.7.  Lactic acid 1.2.  COVID influenza RSV negative.  Chest x-ray shows right middle and right lower lobe lung pneumonia. IV ceftriaxone  and azithromycin started.  Review of Systems: As per HPI all other systems reviewed and negative.  Past Medical History:  Diagnosis Date   Allergic rhinitis, cause unspecified    Cancer (HCC)    prostate cancer 2018   Diabetes mellitus without complication [facility])    patient denies although dx in 2017 with a Dr Cherlyn Cornet ; last hgA1c was however 5.5    Unspecified essential hypertension    Unspecified sleep apnea    CPAP machine use     Past Surgical History:  Procedure Laterality Date   FRACTURE SURGERY     left arm    LYMPHADENECTOMY Bilateral 02/17/2017   Procedure: LYMPHADENECTOMY;  Surgeon: Florencio Hunting, MD;  Location: [facility];  Service: Urology;  Laterality: Bilateral;   ROBOT ASSISTED LAPAROSCOPIC RADICAL PROSTATECTOMY N/A 02/17/2017   Procedure: XI ROBOTIC ASSISTED LAPAROSCOPIC RADICAL PROSTATECTOMY LEVEL 2;  Surgeon: Florencio Hunting, MD;  Location: [facility];  Service: Urology;  Laterality: N/A;   TONSILLECTOMY       reports that he  has quit smoking. He has never used smokeless tobacco. He reports that he does not drink alcohol and does not use drugs.  No Known Allergies  Family History  Problem Relation Age of Onset   Arthritis Mother    Arrhythmia Mother    Stroke Paternal Grandmother    Prior to Admission medications   Medication Sig Start Date End Date Taking? Authorizing Provider  aspirin EC 81 MG tablet Take 81 mg by mouth daily. Swallow whole.   Yes [provider]  atorvastatin  (LIPITOR) 10 MG tablet Take 1 tablet (10 mg total) by mouth daily. 06/30/23  Yes Bedsole, Amy E, MD  cefdinir (OMNICEF) 300 MG capsule Take 300 mg by mouth 2 (two) times daily. 12/19/23  Yes [provider]  losartan  (COZAAR ) 50 MG tablet Take 1 tablet by mouth once daily 09/22/23  Yes Bedsole, Amy E, MD    Physical Exam: Vitals:   12/21/23 1623 12/21/23 1624 12/21/23 1700 12/21/23 1800  BP:  136/78  128/74  Pulse: (!) 112 (!) 112  94  Resp:  20 (!) 22 20  Temp:  (!) 103.2 F (39.6 C)  100.1 F (37.8 C)  TempSrc:  Oral  Oral  SpO2: 93% 91%  94%  Weight: 75.3 kg     Height: 5\' 6"  (1.676 m)       Constitutional: Diaphoretic, calm, comfortable Vitals:   12/21/23 1623 12/21/23 1624 12/21/23 1700 12/21/23  1800  BP:  136/78  128/74  Pulse: (!) 112 (!) 112  94  Resp:  20 (!) 22 20  Temp:  (!) 103.2 F (39.6 C)  100.1 F (37.8 C)  TempSrc:  Oral  Oral  SpO2: 93% 91%  94%  Weight: 75.3 kg     Height: 5\' 6"  (1.676 m)      Eyes: PERRL, lids and conjunctivae normal ENMT: Mucous membranes are moist.  Neck: normal, supple, no masses, no thyromegaly Respiratory: On room air, sats 96%.  Clear to auscultation bilaterally, no wheezing, no crackles. Normal respiratory effort. No accessory muscle use.  Cardiovascular: Regular rate and rhythm, no murmurs / rubs / gallops. No extremity edema.  Extremities warm.  Abdomen: no tenderness, no masses palpated. No hepatosplenomegaly. Bowel sounds positive.  Musculoskeletal:  no clubbing / cyanosis. No joint deformity upper and lower extremities. .  Skin: no rashes, lesions, ulcers. No induration Neurologic: Moving extremities spontaneously, no facial asymmetry, speech fluent.   Psychiatric: Normal judgment and insight. Alert and oriented x 3. Normal mood.   Labs on Admission: I have personally reviewed following labs and imaging studies  CBC: Recent Labs  Lab 12/21/23 1644  WBC 17.7*  NEUTROABS 14.6*  HGB 12.3*  HCT 36.7*  MCV 83.2  PLT 275   Basic Metabolic Panel: Recent Labs  Lab 12/21/23 1644  NA 132*  K 3.7  CL 100  CO2 22  GLUCOSE 127*  BUN 16  CREATININE 1.23  CALCIUM  8.2*   GFR: Estimated Creatinine Clearance: 48.3 mL/min (by C-G formula based on SCr of 1.23 mg/dL). Liver Function Tests: Recent Labs  Lab 12/21/23 1644  AST 69*  ALT 35  ALKPHOS 70  BILITOT 0.6  PROT 7.5  ALBUMIN 2.8*   Coagulation Profile: Recent Labs  Lab 12/21/23 1644  INR 1.2   Urine analysis:    Component Value Date/Time   COLORURINE YELLOW 12/21/2023 1649   APPEARANCEUR HAZY (A) 12/21/2023 1649   LABSPEC 1.026 12/21/2023 1649   PHURINE 5.0 12/21/2023 1649   GLUCOSEU NEGATIVE 12/21/2023 1649   HGBUR SMALL (A) 12/21/2023 1649   HGBUR negative 09/08/2007 0851   BILIRUBINUR NEGATIVE 12/21/2023 1649   KETONESUR NEGATIVE 12/21/2023 1649   PROTEINUR 100 (A) 12/21/2023 1649   UROBILINOGEN negative 09/08/2007 0851   NITRITE NEGATIVE 12/21/2023 1649   LEUKOCYTESUR NEGATIVE 12/21/2023 1649    Radiological Exams on Admission: DG Chest Port 1 View Result Date: 12/21/2023 CLINICAL DATA:  Sepsis EXAM: PORTABLE CHEST 1 VIEW COMPARISON:  Chest x-ray performed October 03, 2015 FINDINGS: A peripheral airspace opacity is present in the right mid and lower lung zone. There is blunting of the right costophrenic angle. Low lung volumes. Thoracic aorta is ectatic. IMPRESSION: 1. Peripheral airspace opacity in the right mid and lower lung zones. Differential  considerations include pneumonia. Possible superimposed small right pleural effusion. Recommend follow-up radiograph to demonstrate resolution. Electronically Signed   By: Reagan Camera M.D.   On: 12/21/2023 16:51   EKG: Independently reviewed.  Sinus rhythm, rate 97, QTc 435.  No significant change from prior.  Assessment/Plan Principal Problem:   Sepsis due to pneumonia [facility]) Active Problems:   Hypertension associated with diabetes (HCC)   Type 2 diabetes mellitus with other circulatory complications HTN (HCC)   Assessment and Plan: * Sepsis due to pneumonia [facility]) Meet sepsis criteria with fever of 103.2, tachycardia, heart rate 94-112, with leukocytosis of 17.7.  Lactic acid 1.2.  O2 sats 91 to 96% on room air.  COVID influenza RSV negative.  UA with rare bacteria not suggestive of infection. Chest x-ray with right middle and right lower lobe pneumonia. -IV ceftriaxone  and azithromycin -Mucolytics -1 L bolus given, cont N/s 100cc/hr x 10hrs - Follow-up blood cultures  Type 2 diabetes mellitus with other circulatory complications HTN (HCC) Controlled.  Not on medication.  Last A1c 01/2023 was 6.2.  Hypertension associated with diabetes (HCC) Stable. -Resume losartan    DVT prophylaxis: Lovenox Code Status:  FULL Family Communication: Spouse and sister at bedside Disposition Plan: ~ 2 days Consults called: None  Admission status: Inpt Tele I certify that at the point of admission it is my clinical judgment that the patient will require inpatient hospital care spanning beyond 2 midnights from the point of admission due to high intensity of service, high risk for further deterioration and high frequency of surveillance required.    Author: Pati Bonine, MD 12/21/2023 8:02 PM  For on call review [email or web address].

## 2023-12-22 ENCOUNTER — Encounter (HOSPITAL_COMMUNITY): Payer: Self-pay | Admitting: Internal Medicine

## 2023-12-22 DIAGNOSIS — J189 Pneumonia, unspecified organism: Secondary | ICD-10-CM | POA: Diagnosis not present

## 2023-12-22 DIAGNOSIS — A419 Sepsis, unspecified organism: Secondary | ICD-10-CM | POA: Diagnosis not present

## 2023-12-22 LAB — CBC
HCT: 35.9 % — ABNORMAL LOW (ref 39.0–52.0)
Hemoglobin: 11.7 g/dL — ABNORMAL LOW (ref 13.0–17.0)
MCH: 27.3 pg (ref 26.0–34.0)
MCHC: 32.6 g/dL (ref 30.0–36.0)
MCV: 83.9 fL (ref 80.0–100.0)
Platelets: 253 10*3/uL (ref 150–400)
RBC: 4.28 MIL/uL (ref 4.22–5.81)
RDW: 15.2 % (ref 11.5–15.5)
WBC: 15.9 10*3/uL — ABNORMAL HIGH (ref 4.0–10.5)
nRBC: 0 % (ref 0.0–0.2)

## 2023-12-22 LAB — BASIC METABOLIC PANEL WITH GFR
Anion gap: 8 (ref 5–15)
BUN: 15 mg/dL (ref 8–23)
CO2: 22 mmol/L (ref 22–32)
Calcium: 7.6 mg/dL — ABNORMAL LOW (ref 8.9–10.3)
Chloride: 105 mmol/L (ref 98–111)
Creatinine, Ser: 1.09 mg/dL (ref 0.61–1.24)
GFR, Estimated: >60 mL/min (ref >60)
Glucose, Bld: 118 mg/dL — ABNORMAL HIGH (ref 70–99)
Potassium: 3.9 mmol/L (ref 3.5–5.1)
Sodium: 135 mmol/L (ref 135–145)

## 2023-12-22 MED ORDER — GUAIFENESIN-DM 100-10 MG/5ML PO SYRP
15.0000 mL | ORAL_SOLUTION | Freq: Three times a day (TID) | ORAL | Status: DC
  Administered 2023-12-22 – 2023-12-23 (×2): 15 mL via ORAL
  Filled 2023-12-22 (×2): qty 15

## 2023-12-22 MED ORDER — IBUPROFEN 600 MG PO TABS: 600.0000 mg | ORAL_TABLET | Freq: Four times a day (QID) | ORAL | Status: DC | PRN

## 2023-12-22 MED ORDER — ALBUTEROL SULFATE (2.5 MG/3ML) 0.083% IN NEBU
2.5000 mg | INHALATION_SOLUTION | RESPIRATORY_TRACT | Status: DC | PRN
  Administered 2023-12-22: 2.5 mg via RESPIRATORY_TRACT
  Filled 2023-12-22: qty 3

## 2023-12-22 MED ORDER — LACTATED RINGERS IV SOLN: INTRAVENOUS | Status: AC

## 2023-12-22 NOTE — Progress Notes (Signed)
   12/22/23 2217  BiPAP/CPAP/SIPAP  BiPAP/CPAP/SIPAP Pt Type Adult  BiPAP/CPAP/SIPAP DREAMSTATIOND  Mask Type Full face mask  Dentures removed? Not applicable  Mask Size Medium  Respiratory Rate 18 breaths/min  EPAP 12 cmH2O  FiO2 (%) 21 %  Patient Home Machine No  Patient Home Mask No  Patient Home Tubing No  Auto Titrate No  Device Plugged into RED Power Outlet Yes  BiPAP/CPAP /SiPAP Vitals  Pulse Rate 90  Resp 18  SpO2 90 %  Bilateral Breath Sounds Clear;Diminished

## 2023-12-22 NOTE — Progress Notes (Signed)
 PROGRESS NOTE    Patient: Chase Moreno                            PCP: Judithann Novas, MD                    DOB: 12/24/1950            DOA: 12/21/2023 WGN:[id number]             DOS: 12/22/2023, 11:16 AM   LOS: 1 day   Date of Service: The patient was seen and examined on 12/22/2023  Subjective:   The patient was seen and examined this morning. Hemodynamically stable. No issues overnight .  Brief Narrative:   Chase Moreno is a 73 y.o. male with medical history significant for diabetes melitis, hypertension.  Patient presented to the ED with complaints of cough, decreased appetite, fever and chills over the past week.  He denies difficulty breathing, no lower extremity swelling, no chest pain.  He reported a tick bite about a week ago, but no rash. He went to an urgent care- 6/6, and was started on cefdinir 300 twice daily.  He took 4 doses of it.    ED Course: Tmax 103.2.  Tachycardic, heart rate 83-112.  Respiratory 20 - 22.  O2 sats 96% on room air.   WBC 17.7.  Lactic acid 1.2.  COVID influenza RSV negative.  Chest x-ray shows right middle and right lower lobe lung pneumonia. IV ceftriaxone  and azithromycin started.  Principal Problem:   Sepsis due to pneumonia [facility]) Active Problems:   Hypertension associated with diabetes (HCC)   Type 2 diabetes mellitus with other circulatory complications HTN (HCC)  Assessment and Plan:  * Sepsis due to pneumonia [facility]) Still meeting sepsis criteria, with Tmax of 100.9 this morning, improved leukocytosis but still at 15.9, - Satting 94% on room air - shortness of breath and cough with exertion  POA: Meet sepsis criteria with fever of 103.2, tachycardia, heart rate 94-112, with leukocytosis of 17.7.  Lactic acid 1.2.  O2 sats 91 to 96% on room air.   COVID influenza RSV negative.   UA with rare bacteria not suggestive of infection.  Chest x-ray with right middle and right lower lobe pneumonia. -cont. IV ceftriaxone  and  azithromycin -Mucolytics -1 L bolus given, continue with maintenance IV fluids - Following blood cultures -no growth to date Will try to obtain sputum cultures   Type 2 diabetes mellitus with other circulatory complications HTN (HCC) Controlled.  Not on medication.   Last A1c 01/2023 was 6.2. -Stable   Hypertension associated with diabetes (HCC) Remained stable -Resume losartan     Assessment & Plan:        ------------------------------------------------------------------------------------------------------------------- Nutritional status:  The patient's BMI is: Body mass index is 31.14 kg/m. I agree with the assessment and plan as outlined ------------------------------------------------------------------------------------------------------------------ Cultures; Blood Cultures x 2 >> no growth Sputum Culture >> try to obtain   ------------------------------------------------------------------------------------------------------------------------------------------------  DVT prophylaxis:  enoxaparin (LOVENOX) injection 40 mg Start: 12/21/23 2200   Code Status:   Code Status: Full Code  Family Communication: No family member present at bedside--Advance care planning has been discussed.   Admission status:   Status is: Inpatient Remains inpatient appropriate because: Needing IV antibiotics   Disposition: From  - home             Planning for discharge in 1-2 days: to   Procedures:   No  admission procedures for hospital encounter.   Antimicrobials:  Anti-infectives (From admission, onward)    Start     Dose/Rate Route Frequency Ordered Stop   12/22/23 1800  cefTRIAXone  (ROCEPHIN ) 2 g in sodium chloride  0.9 % 100 mL IVPB        2 g 200 mL/hr over 30 Minutes Intravenous Every 24 hours 12/21/23 2037 12/27/23 1759   12/22/23 1700  azithromycin (ZITHROMAX) 500 mg in sodium chloride  0.9 % 250 mL IVPB        500 mg 250 mL/hr over 60 Minutes Intravenous Every 24  hours 12/21/23 2037 12/27/23 1659   12/21/23 1645  cefTRIAXone  (ROCEPHIN ) 2 g in sodium chloride  0.9 % 100 mL IVPB        2 g 200 mL/hr over 30 Minutes Intravenous Once 12/21/23 1642 12/21/23 1850   12/21/23 1645  azithromycin (ZITHROMAX) 500 mg in sodium chloride  0.9 % 250 mL IVPB        500 mg 250 mL/hr over 60 Minutes Intravenous  Once 12/21/23 1642 12/21/23 2008        Medication:   aspirin EC  81 mg Oral Daily   enoxaparin (LOVENOX) injection  40 mg Subcutaneous Q24H   feeding supplement  237 mL Oral BID BM   guaiFENesin-dextromethorphan  15 mL Oral Q8H   losartan   50 mg Oral Daily    acetaminophen  **OR** acetaminophen , albuterol, ibuprofen, ondansetron  **OR** ondansetron  (ZOFRAN ) IV, polyethylene glycol   Objective:   Vitals:   12/22/23 0204 12/22/23 0518 12/22/23 0607 12/22/23 0620  BP: (!) 135/90 135/84    Pulse: 97 93    Resp: (!) 24 (!) 24    Temp: 99.3 F (37.4 C) (!) 100.5 F (38.1 C)  (!) 100.9 F (38.3 C)  TempSrc: Oral Oral  Oral  SpO2: 100% 95% 94%   Weight:      Height:        Intake/Output Summary (Last 24 hours) at 12/22/2023 1116 Last data filed at 12/22/2023 0900 Gross per 24 hour  Intake 1045.19 ml  Output 200 ml  Net 845.19 ml   Filed Weights   12/21/23 1623 12/21/23 2040  Weight: 75.3 kg 87.5 kg     Physical examination:       General:  AAO x 3,  cooperative, no distress;   HEENT:  Normocephalic, PERRL, otherwise with in Normal limits   Neuro:  CNII-XII intact. , normal motor and sensation, reflexes intact   Lungs:   Clear to auscultation BL, Respirations unlabored,  No wheezes / crackles  Cardio:    S1/S2, RRR, No murmure, No Rubs or Gallops   Abdomen:  Soft, non-tender, bowel sounds active all four quadrants, no guarding or peritoneal signs.  Muscular  skeletal:  Limited exam -global generalized weaknesses - in bed, able to move all 4 extremities,   2+ pulses,  symmetric, No pitting edema  Skin:  Dry, warm to touch, negative  for any Rashes,  Wounds: Please see nursing documentation        -----------------------------------------------------------------------------------------------------------------------    LABs:     Latest Ref Rng & Units 12/22/2023    4:53 AM 12/21/2023    4:44 PM 02/18/2017    5:26 AM  CBC  WBC 4.0 - 10.5 K/uL 15.9  17.7    Hemoglobin 13.0 - 17.0 g/dL 72.5  36.6  44.0   Hematocrit 39.0 - 52.0 % 35.9  36.7  36.0   Platelets 150 - 400 K/uL 253  275  Latest Ref Rng & Units 12/22/2023    4:53 AM 12/21/2023    4:44 PM 02/04/2023   10:19 AM  CMP  Glucose 70 - 99 mg/dL 161  096  91   BUN 8 - 23 mg/dL 15  16  12    Creatinine 0.61 - 1.24 mg/dL 0.45  4.09  8.11   Sodium 135 - 145 mmol/L 135  132  141   Potassium 3.5 - 5.1 mmol/L 3.9  3.7  4.3   Chloride 98 - 111 mmol/L 105  100  107   CO2 22 - 32 mmol/L 22  22  28    Calcium  8.9 - 10.3 mg/dL 7.6  8.2  9.1   Total Protein 6.5 - 8.1 g/dL  7.5  7.4   Total Bilirubin 0.0 - 1.2 mg/dL  0.6  0.6   Alkaline Phos 38 - 126 U/L  70  63   AST 15 - 41 U/L  69  18   ALT 0 - 44 U/L  35  12        Micro Results Recent Results (from the past 240 hours)  Culture, blood (Routine x 2)     Status: None (Preliminary result)   Collection Time: 12/21/23  4:44 PM   Specimen: Left Antecubital; Blood  Result Value Ref Range Status   Specimen Description   Final    LEFT ANTECUBITAL BOTTLES DRAWN AEROBIC AND ANAEROBIC   Special Requests Blood Culture adequate volume  Final   Culture   Final    NO GROWTH < 24 HOURS Performed at [facility], [address] [place], [place] [address]    Report Status PENDING  Incomplete  Culture, blood (Routine x 2)     Status: None (Preliminary result)   Collection Time: 12/21/23  4:44 PM   Specimen: BLOOD  Result Value Ref Range Status   Specimen Description BLOOD BLOOD LEFT ARM  Final   Special Requests NONE  Final   Culture   Final    NO GROWTH < 24 HOURS Performed at [facility], [address]  [place], [place] [address]    Report Status PENDING  Incomplete  Resp panel by RT-PCR (RSV, Flu A&B, Covid) Anterior Nasal Swab     Status: None   Collection Time: 12/21/23  4:45 PM   Specimen: Anterior Nasal Swab  Result Value Ref Range Status   SARS Coronavirus 2 by RT PCR NEGATIVE NEGATIVE Final    Comment: (NOTE) SARS-CoV-2 target nucleic acids are NOT DETECTED.  The SARS-CoV-2 RNA is generally detectable in upper respiratory specimens during the acute phase of infection. The lowest concentration of SARS-CoV-2 viral copies this assay can detect is 138 copies/mL. A negative result does not preclude SARS-Cov-2 infection and should not be used as the sole basis for treatment or other patient management decisions. A negative result may occur with  improper specimen collection/handling, submission of specimen other than nasopharyngeal swab, presence of viral mutation(s) within the areas targeted by this assay, and inadequate number of viral copies(<138 copies/mL). A negative result must be combined with clinical observations, patient history, and epidemiological information. The expected result is Negative.  Fact Sheet for Patients:  BloggerCourse.com  Fact Sheet for Healthcare Providers:  SeriousBroker.it  This test is no t yet approved or cleared by the [place]  FDA and  has been authorized for detection and/or diagnosis of SARS-CoV-2 by FDA under an Emergency Use Authorization (EUA). This EUA will remain  in effect (meaning this test can  be used) for the duration of the COVID-19 declaration under Section 564(b)(1) of the Act, 21 U.S.C.section 360bbb-3(b)(1), unless the authorization is terminated  or revoked sooner.       Influenza A by PCR NEGATIVE NEGATIVE Final   Influenza B by PCR NEGATIVE NEGATIVE Final    Comment: (NOTE) The Xpert Xpress SARS-CoV-2/FLU/RSV plus assay is intended as an aid in the diagnosis of influenza  from Nasopharyngeal swab specimens and should not be used as a sole basis for treatment. Nasal washings and aspirates are unacceptable for Xpert Xpress SARS-CoV-2/FLU/RSV testing.  Fact Sheet for Patients: BloggerCourse.com  Fact Sheet for Healthcare Providers: SeriousBroker.it  This test is not yet approved or cleared by the [place]  FDA and has been authorized for detection and/or diagnosis of SARS-CoV-2 by FDA under an Emergency Use Authorization (EUA). This EUA will remain in effect (meaning this test can be used) for the duration of the COVID-19 declaration under Section 564(b)(1) of the Act, 21 U.S.C. section 360bbb-3(b)(1), unless the authorization is terminated or revoked.     Resp Syncytial Virus by PCR NEGATIVE NEGATIVE Final    Comment: (NOTE) Fact Sheet for Patients: BloggerCourse.com  Fact Sheet for Healthcare Providers: SeriousBroker.it  This test is not yet approved or cleared by the [place]  FDA and has been authorized for detection and/or diagnosis of SARS-CoV-2 by FDA under an Emergency Use Authorization (EUA). This EUA will remain in effect (meaning this test can be used) for the duration of the COVID-19 declaration under Section 564(b)(1) of the Act, 21 U.S.C. section 360bbb-3(b)(1), unless the authorization is terminated or revoked.  Performed at [facility], [address] [place], [place] [address]     Radiology Reports DG Chest Midway 1 View Result Date: 12/21/2023 CLINICAL DATA:  Sepsis EXAM: PORTABLE CHEST 1 VIEW COMPARISON:  Chest x-ray performed October 03, 2015 FINDINGS: A peripheral airspace opacity is present in the right mid and lower lung zone. There is blunting of the right costophrenic angle. Low lung volumes. Thoracic aorta is ectatic. IMPRESSION: 1. Peripheral airspace opacity in the right mid and lower lung zones. Differential  considerations include pneumonia. Possible superimposed small right pleural effusion. Recommend follow-up radiograph to demonstrate resolution. Electronically Signed   By: Reagan Camera M.D.   On: 12/21/2023 16:51    SIGNED: Bobbetta Burnet, MD, FHM. FAAFP. Arlin Benes - [facility] Time spent - 55 min.  In seeing, evaluating and examining the patient. Reviewing medical records, labs, drawn plan of care. [facility],  Pager (please use [email or web address] to page/ text) Please use Epic Secure Chat for non-urgent communication (7AM-7PM)  If 7PM-7AM, please contact night-coverage [email or web address], 12/22/2023, 11:16 AM

## 2023-12-22 NOTE — Plan of Care (Signed)

## 2023-12-22 NOTE — Hospital Course (Signed)
 Chase Moreno is a 73 y.o. male with medical history significant for diabetes melitis, hypertension.  Patient presented to the ED with complaints of cough, decreased appetite, fever and chills over the past week.  He denies difficulty breathing, no lower extremity swelling, no chest pain.  He reported a tick bite about a week ago, but no rash. He went to an urgent care- 6/6, and was started on cefdinir 300 twice daily.  He took 4 doses of it.    ED Course: Tmax 103.2.  Tachycardic, heart rate 83-112.  Respiratory 20 - 22.  O2 sats 96% on room air.   WBC 17.7.  Lactic acid 1.2.  COVID influenza RSV negative.  Chest x-ray shows right middle and right lower lobe lung pneumonia. IV ceftriaxone  and azithromycin started.    Assessment and Plan:  * Sepsis due to pneumonia [facility] [place]) POA: Meet sepsis criteria with fever of 103.2, tachycardia, heart rate 94-112, with leukocytosis of 17.7.  Lactic acid 1.2.  O2 sats 91 to 96% on room air.   COVID influenza RSV negative.   UA with rare bacteria not suggestive of infection.  Chest x-ray with right middle and right lower lobe pneumonia. -cont. IV ceftriaxone  and azithromycin -Mucolytics -1 L bolus given, cont N/s 100cc/hr x 10hrs - Follow-up blood cultures   Type 2 diabetes mellitus with other circulatory complications HTN (HCC) Controlled.  Not on medication.   Last A1c 01/2023 was 6.2.   Hypertension associated with diabetes (HCC) Stable. -Resume losartan 

## 2023-12-22 NOTE — Plan of Care (Signed)

## 2023-12-22 NOTE — TOC CM/SW Note (Signed)
 Transition of Care [facility]) - Inpatient Brief Assessment   Patient Details  Name: Chase Moreno MRN: [id number] Date of Birth: 07/03/1951  Transition of Care [facility]) CM/SW Contact:    Grandville Lax, LCSWA Phone Number: 12/22/2023, 8:35 AM   Clinical Narrative: Transition of Care Department [facility]) has reviewed patient and no TOC needs have been identified at this time. We will continue to monitor patient advancement through interdiciplinary progression rounds. If new patient transition needs arise, please place a TOC consult.  Transition of Care Asessment: Insurance and Status: Insurance coverage has been reviewed Patient has primary care physician: Yes Home environment has been reviewed: From home Prior level of function:: Independent Prior/Current Home Services: No current home services Social Drivers of Health Review: SDOH reviewed no interventions necessary Readmission risk has been reviewed: Yes Transition of care needs: no transition of care needs at this time

## 2023-12-23 DIAGNOSIS — A419 Sepsis, unspecified organism: Secondary | ICD-10-CM | POA: Diagnosis not present

## 2023-12-23 DIAGNOSIS — J189 Pneumonia, unspecified organism: Secondary | ICD-10-CM | POA: Diagnosis not present

## 2023-12-23 MED ORDER — LEVOFLOXACIN 750 MG PO TABS
750.0000 mg | ORAL_TABLET | Freq: Every day | ORAL | 0 refills | Status: AC
Start: 2023-12-24 — End: 2023-12-29

## 2023-12-23 MED ORDER — GUAIFENESIN-DM 100-10 MG/5ML PO SYRP: 15.0000 mL | ORAL_SOLUTION | Freq: Three times a day (TID) | ORAL | 0 refills | Status: AC

## 2023-12-23 NOTE — Discharge Summary (Signed)
 Physician Discharge Summary   Patient: Chase Moreno MRN: [id number] DOB: October 16, 1950  Admit date:     12/21/2023  Discharge date: 12/23/23  Discharge Physician: Bobbetta Burnet   PCP: Judithann Novas, MD   Recommendations at discharge:   Follow-up with PCP 2-4 weeks  Discharge Diagnoses: Principal Problem:   Sepsis due to pneumonia [facility]) Active Problems:   Hypertension associated with diabetes (HCC)   Type 2 diabetes mellitus with other circulatory complications HTN (HCC)  Resolved Problems:   * No resolved hospital problems. [facility] Course: Chase Moreno is a 73 y.o. male with medical history significant for diabetes melitis, hypertension.  Patient presented to the ED with complaints of cough, decreased appetite, fever and chills over the past week.  He denies difficulty breathing, no lower extremity swelling, no chest pain.  He reported a tick bite about a week ago, but no rash. He went to an urgent care- 6/6, and was started on cefdinir 300 twice daily.  He took 4 doses of it.    ED Course: Tmax 103.2.  Tachycardic, heart rate 83-112.  Respiratory 20 - 22.  O2 sats 96% on room air.   WBC 17.7.  Lactic acid 1.2.  COVID influenza RSV negative.  Chest x-ray shows right middle and right lower lobe lung pneumonia. IV ceftriaxone  and azithromycin started.    Assessment and Plan:  * Sepsis due to pneumonia (HCC) -Sepsis physiology resolved, hemodynamically stable  POA: Meet sepsis criteria with fever of 103.2, tachycardia, heart rate 94-112, with leukocytosis of 17.7.  Lactic acid 1.2.  O2 sats 91 to 96% on room air.   COVID influenza RSV negative.   UA with rare bacteria not suggestive of infection.  Chest x-ray with right middle and right lower lobe pneumonia. Was  IV ceftriaxone  and azithromycin--- switching to p.o. Levaquin -Mucolytics - s/p IV fluid hydration -Follow-up blood cultures no growth to date - Follow-up blood cultures   Type 2 diabetes mellitus with  other circulatory complications HTN (HCC) Controlled.  Not on medication.   Last A1c 01/2023 was 6.2. -Carb modified diet recommended   Hypertension associated with diabetes (HCC) Stable. -Resume losartan    Disposition: Home Diet recommendation:  Discharge Diet Orders (From admission, onward)     Start     Ordered   12/23/23 0000  Diet - low sodium heart healthy        12/23/23 1127           Cardiac and Carb modified diet DISCHARGE MEDICATION: Allergies as of 12/23/2023   No Known Allergies      Medication List     STOP taking these medications    cefdinir 300 MG capsule Commonly known as: OMNICEF       TAKE these medications    aspirin EC 81 MG tablet Take 81 mg by mouth daily. Swallow whole.   atorvastatin  10 MG tablet Commonly known as: LIPITOR Take 1 tablet (10 mg total) by mouth daily.   guaiFENesin-dextromethorphan 100-10 MG/5ML syrup Commonly known as: ROBITUSSIN DM Take 15 mLs by mouth every 8 (eight) hours for 5 days.   levofloxacin 750 MG tablet Commonly known as: Levaquin Take 1 tablet (750 mg total) by mouth daily for 5 days. Start taking on: December 24, 2023   losartan  50 MG tablet Commonly known as: COZAAR  Take 1 tablet by mouth once daily        Discharge Exam: Filed Weights   12/21/23 1623 12/21/23 2040  Weight: 75.3 kg  87.5 kg        General:  AAO x 3,  cooperative, no distress;   HEENT:  Normocephalic, PERRL, otherwise with in Normal limits   Neuro:  CNII-XII intact. , normal motor and sensation, reflexes intact   Lungs:   Clear to auscultation BL, Respirations unlabored,  No wheezes / crackles  Cardio:    S1/S2, RRR, No murmure, No Rubs or Gallops   Abdomen:  Soft, non-tender, bowel sounds active all four quadrants, no guarding or peritoneal signs.  Muscular  skeletal:  Limited exam -global generalized weaknesses - in bed, able to move all 4 extremities,   2+ pulses,  symmetric, No pitting edema  Skin:  Dry, warm  to touch, negative for any Rashes,  Wounds: Please see nursing documentation          Condition at discharge: good  The results of significant diagnostics from this hospitalization (including imaging, microbiology, ancillary and laboratory) are listed below for reference.   Imaging Studies: DG Chest Port 1 View Result Date: 12/21/2023 CLINICAL DATA:  Sepsis EXAM: PORTABLE CHEST 1 VIEW COMPARISON:  Chest x-ray performed October 03, 2015 FINDINGS: A peripheral airspace opacity is present in the right mid and lower lung zone. There is blunting of the right costophrenic angle. Low lung volumes. Thoracic aorta is ectatic. IMPRESSION: 1. Peripheral airspace opacity in the right mid and lower lung zones. Differential considerations include pneumonia. Possible superimposed small right pleural effusion. Recommend follow-up radiograph to demonstrate resolution. Electronically Signed   By: Reagan Camera M.D.   On: 12/21/2023 16:51    Microbiology: Results for orders placed or performed during the hospital encounter of 12/21/23  Culture, blood (Routine x 2)     Status: None (Preliminary result)   Collection Time: 12/21/23  4:44 PM   Specimen: Left Antecubital; Blood  Result Value Ref Range Status   Specimen Description   Final    LEFT ANTECUBITAL BOTTLES DRAWN AEROBIC AND ANAEROBIC   Special Requests Blood Culture adequate volume  Final   Culture   Final    NO GROWTH 2 DAYS Performed at [facility], [address], [place], [place] [address]    Report Status PENDING  Incomplete  Culture, blood (Routine x 2)     Status: None (Preliminary result)   Collection Time: 12/21/23  4:44 PM   Specimen: BLOOD  Result Value Ref Range Status   Specimen Description BLOOD BLOOD LEFT ARM  Final   Special Requests NONE  Final   Culture   Final    NO GROWTH 2 DAYS Performed at [facility], [address], [place], [place] [address]    Report Status PENDING  Incomplete  Resp panel by RT-PCR (RSV, Flu A&B,  Covid) Anterior Nasal Swab     Status: None   Collection Time: 12/21/23  4:45 PM   Specimen: Anterior Nasal Swab  Result Value Ref Range Status   SARS Coronavirus 2 by RT PCR NEGATIVE NEGATIVE Final    Comment: (NOTE) SARS-CoV-2 target nucleic acids are NOT DETECTED.  The SARS-CoV-2 RNA is generally detectable in upper respiratory specimens during the acute phase of infection. The lowest concentration of SARS-CoV-2 viral copies this assay can detect is 138 copies/mL. A negative result does not preclude SARS-Cov-2 infection and should not be used as the sole basis for treatment or other patient management decisions. A negative result may occur with  improper specimen collection/handling, submission of specimen other than nasopharyngeal swab, presence of viral mutation(s) within the areas  targeted by this assay, and inadequate number of viral copies(<138 copies/mL). A negative result must be combined with clinical observations, patient history, and epidemiological information. The expected result is Negative.  Fact Sheet for Patients:  BloggerCourse.com  Fact Sheet for Healthcare Providers:  SeriousBroker.it  This test is no t yet approved or cleared by the [place]  FDA and  has been authorized for detection and/or diagnosis of SARS-CoV-2 by FDA under an Emergency Use Authorization (EUA). This EUA will remain  in effect (meaning this test can be used) for the duration of the COVID-19 declaration under Section 564(b)(1) of the Act, 21 U.S.C.section 360bbb-3(b)(1), unless the authorization is terminated  or revoked sooner.       Influenza A by PCR NEGATIVE NEGATIVE Final   Influenza B by PCR NEGATIVE NEGATIVE Final    Comment: (NOTE) The Xpert Xpress SARS-CoV-2/FLU/RSV plus assay is intended as an aid in the diagnosis of influenza from Nasopharyngeal swab specimens and should not be used as a sole basis for treatment. Nasal  washings and aspirates are unacceptable for Xpert Xpress SARS-CoV-2/FLU/RSV testing.  Fact Sheet for Patients: BloggerCourse.com  Fact Sheet for Healthcare Providers: SeriousBroker.it  This test is not yet approved or cleared by the [place]  FDA and has been authorized for detection and/or diagnosis of SARS-CoV-2 by FDA under an Emergency Use Authorization (EUA). This EUA will remain in effect (meaning this test can be used) for the duration of the COVID-19 declaration under Section 564(b)(1) of the Act, 21 U.S.C. section 360bbb-3(b)(1), unless the authorization is terminated or revoked.     Resp Syncytial Virus by PCR NEGATIVE NEGATIVE Final    Comment: (NOTE) Fact Sheet for Patients: BloggerCourse.com  Fact Sheet for Healthcare Providers: SeriousBroker.it  This test is not yet approved or cleared by the [place]  FDA and has been authorized for detection and/or diagnosis of SARS-CoV-2 by FDA under an Emergency Use Authorization (EUA). This EUA will remain in effect (meaning this test can be used) for the duration of the COVID-19 declaration under Section 564(b)(1) of the Act, 21 U.S.C. section 360bbb-3(b)(1), unless the authorization is terminated or revoked.  Performed at [facility], [address] [place], [place] [address]     Labs: CBC: Recent Labs  Lab 12/21/23 1644 12/22/23 0453  WBC 17.7* 15.9*  NEUTROABS 14.6*  --   HGB 12.3* 11.7*  HCT 36.7* 35.9*  MCV 83.2 83.9  PLT 275 253   Basic Metabolic Panel: Recent Labs  Lab 12/21/23 1644 12/22/23 0453  NA 132* 135  K 3.7 3.9  CL 100 105  CO2 22 22  GLUCOSE 127* 118*  BUN 16 15  CREATININE 1.23 1.09  CALCIUM  8.2* 7.6*   Liver Function Tests: Recent Labs  Lab 12/21/23 1644  AST 69*  ALT 35  ALKPHOS 70  BILITOT 0.6  PROT 7.5  ALBUMIN 2.8*   CBG: No results for input(s): "GLUCAP" in  the last 168 hours.  Discharge time spent: greater than 30 minutes.  Signed: Bobbetta Burnet, MD [facility] 12/23/2023

## 2023-12-23 NOTE — Plan of Care (Signed)
  Problem: Clinical Measurements: Goal: Respiratory complications will improve Outcome: Progressing   Problem: Activity: Goal: Ability to tolerate increased activity will improve Outcome: Progressing   Problem: Clinical Measurements: Goal: Ability to maintain a body temperature in the normal range will improve Outcome: Progressing   Problem: Respiratory: Goal: Ability to maintain adequate ventilation will improve Outcome: Progressing Goal: Ability to maintain a clear airway will improve Outcome: Progressing

## 2023-12-24 ENCOUNTER — Telehealth: Payer: Self-pay

## 2023-12-24 NOTE — Patient Instructions (Signed)
 Visit Information  Thank you for taking time to visit with me today. Please don't hesitate to contact me if I can be of assistance to you before our next scheduled telephone appointment.  Our next appointment is by telephone on 12/31/2023 at 10:30 am  Following is a copy of your care plan:   Goals Addressed             This Visit's Progress    VBCI Transitions of Care (TOC) Care Plan       Problems:  Recent Hospitalization for treatment of Pulmonary Disease and Sepsis pneumonia Knowledge Deficit Related to disease process and follow up needs for post hospital  Goal:  Over the next 30 days, the patient will not experience hospital readmission  Interventions:  Transitions of Care: Doctor Visits  - discussed the importance of doctor visits Reviewed Signs and symptoms of infection  Patient Self Care Activities:  Attend all scheduled provider appointments Call pharmacy for medication refills 3-7 days in advance of running out of medications Call provider office for new concerns or questions  Notify RN Care Manager of TOC call rescheduling needs Participate in Transition of Care Program/Attend TOC scheduled calls  Plan:  Follow up with provider re: copy of his After Visit Summary from hospital; reviewed with patient and offered to mail, states they wish to get it from PCP visit tomorrow's appointment. The care management team will reach out to the patient again over the next 7 -10 business days. The patient has been provided with contact information for the care management team and has been advised to call with any health related questions or concerns.         Patient's wife states she will get a copy of AVS from appointment with Dr. Cherlyn Cornet tomorrow.   Telephone follow up appointment with care management team member scheduled for: Next PCP appointment scheduled for:   Please call the care guide team at [phone] if you need to cancel or reschedule your appointment.    Please call the [place]  National Suicide Prevention Lifeline: 917-212-2354 or TTY: (931)109-3976 TTY (541)[phone]) to talk to a trained counselor if you are experiencing a Mental Health or Behavioral Health Crisis or need someone to talk to.  Brown [place], RN, BSN, CCM [facility], [facility] RN Care Manager Direct Dial: [phone]

## 2023-12-24 NOTE — Transitions of Care (Post Inpatient/ED Visit) (Signed)
 12/24/2023  Name: Chase Moreno MRN: [id number] DOB: Apr 12, 1951  Today's TOC FU Call Status: Today's TOC FU Call Status:: Successful TOC FU Call Completed TOC FU Call Complete Date: 12/24/23 Patient's Name and Date of Birth confirmed.  Transition Care Management Follow-up Telephone Call Date of Discharge: 12/23/23 Discharge [facility] Type of Discharge: Inpatient Admission Primary Inpatient Discharge Diagnosis:: Pneumonia How have you been since you were released from the hospital?: Worse Any questions or concerns?: Yes Patient Questions/Concerns:: Still has a cough Patient Questions/Concerns Addressed: Other: (Patient states he doesn't have AVS/DC papers states, I think I left them at the hospital)  Items Reviewed: Did you receive and understand the discharge instructions provided?: No Medications obtained,verified, and reconciled?: Yes (Medications Reviewed) Any new allergies since your discharge?: No Dietary orders reviewed?: Yes Type of Diet Ordered:: Carb modified HH diet Do you have support at home?: Yes People in Home [RPT]: spouse Name of Support/Comfort Primary Source: [name]  Medications Reviewed Today: Medications Reviewed Today     Reviewed by Jamie Mccoy, RN (Registered Nurse) on 12/24/23 at 1418  Med List Status: <None>   Medication Order Taking? Sig Documenting Provider Last Dose Status Informant  aspirin EC 81 MG tablet [id number]  Take 81 mg by mouth daily. Swallow whole. [provider]  Active Self, Pharmacy Records  atorvastatin  (LIPITOR) 10 MG tablet [id number]  Take 1 tablet (10 mg total) by mouth daily. Judithann Novas, MD  Active Self, Pharmacy Records  guaiFENesin-dextromethorphan [facility] DM) 100-10 MG/5ML syrup [id number] Yes Take 15 mLs by mouth every 8 (eight) hours for 5 days. Bobbetta Burnet, MD Taking Active   levofloxacin (LEVAQUIN) 750 MG tablet [id number] Yes Take 1 tablet (750 mg total) by mouth daily for 5  days. Bobbetta Burnet, MD Taking Active   losartan  (COZAAR ) 50 MG tablet [id number]  Take 1 tablet by mouth once daily Judithann Novas, MD  Active Self, Pharmacy Records            Home Care and Equipment/Supplies: Were Home Health Services Ordered?: No Any new equipment or medical supplies ordered?: No     Follow up appointments reviewed: PCP Follow-up appointment confirmed?: Yes Date of PCP follow-up appointment?: 12/25/23 Follow-up Provider: Herby Lolling, MD Specialist Hospital Follow-up appointment confirmed?: No Do you need transportation to your follow-up appointment?: No Do you understand care options if your condition(s) worsen?: Yes-patient verbalized understanding (Follow up with PCP tomorrow)  SDOH Interventions Today    Flowsheet Row Most Recent Value  SDOH Interventions   Food Insecurity Interventions Intervention Not Indicated  Housing Interventions Intervention Not Indicated  Transportation Interventions Intervention Not Indicated  Utilities Interventions Intervention Not Indicated       Goals Addressed             This Visit's Progress    VBCI Transitions of Care (TOC) Care Plan       Problems:  Recent Hospitalization for treatment of Pulmonary Disease and Sepsis pneumonia Knowledge Deficit Related to disease process and follow up needs for post hospital  Goal:  Over the next 30 days, the patient will not experience hospital readmission  Interventions:  Transitions of Care: Doctor Visits  - discussed the importance of doctor visits Reviewed Signs and symptoms of infection  Patient Self Care Activities:  Attend all scheduled provider appointments Call pharmacy for medication refills 3-7 days in advance of running out of medications Call provider office for new concerns or questions  Notify RN Care Manager  of TOC call rescheduling needs Participate in Transition of Care Program/Attend TOC scheduled calls  Plan:  Follow up with provider re:  copy of his After Visit Summary from hospital; reviewed with patient and offered to mail, states they wish to get it from PCP visit tomorrow's appointment. The care management team will reach out to the patient again over the next 7 -10 business days. The patient has been provided with contact information for the care management team and has been advised to call with any health related questions or concerns.         Brown Cape, RN, BSN, CCM [facility], [facility] RN Care Manager Direct Dial: [phone]

## 2023-12-25 ENCOUNTER — Ambulatory Visit: Payer: Medicare (Managed Care) | Admitting: Family Medicine

## 2023-12-25 ENCOUNTER — Encounter: Payer: Self-pay | Admitting: Family Medicine

## 2023-12-25 VITALS — BP 130/80 | HR 97 | Temp 97.7°F | Wt 190.8 lb

## 2023-12-25 DIAGNOSIS — I152 Hypertension secondary to endocrine disorders: Secondary | ICD-10-CM

## 2023-12-25 DIAGNOSIS — E785 Hyperlipidemia, unspecified: Secondary | ICD-10-CM

## 2023-12-25 DIAGNOSIS — E1169 Type 2 diabetes mellitus with other specified complication: Secondary | ICD-10-CM

## 2023-12-25 DIAGNOSIS — J189 Pneumonia, unspecified organism: Secondary | ICD-10-CM

## 2023-12-25 DIAGNOSIS — E1159 Type 2 diabetes mellitus with other circulatory complications: Secondary | ICD-10-CM | POA: Diagnosis not present

## 2023-12-25 LAB — CBC WITH DIFFERENTIAL/PLATELET
Absolute Lymphocytes: 1156 {cells}/uL (ref 850–3900)
Absolute Monocytes: 959 {cells}/uL — ABNORMAL HIGH (ref 200–950)
Basophils Absolute: 62 {cells}/uL (ref 0–200)
Basophils Relative: 0.5 %
Eosinophils Absolute: 357 {cells}/uL (ref 15–500)
Eosinophils Relative: 2.9 %
HCT: 38.4 % — ABNORMAL LOW (ref 38.5–50.0)
Hemoglobin: 12.2 g/dL — ABNORMAL LOW (ref 13.2–17.1)
MCH: 27.2 pg (ref 27.0–33.0)
MCHC: 31.8 g/dL — ABNORMAL LOW (ref 32.0–36.0)
MCV: 85.5 fL (ref 80.0–100.0)
MPV: 10.7 fL (ref 7.5–12.5)
Monocytes Relative: 7.8 %
Neutro Abs: 9766 {cells}/uL — ABNORMAL HIGH (ref 1500–7800)
Neutrophils Relative %: 79.4 %
Platelets: 346 10*3/uL (ref 140–400)
RBC: 4.49 10*6/uL (ref 4.20–5.80)
RDW: 14.4 % (ref 11.0–15.0)
Total Lymphocyte: 9.4 %
WBC: 12.3 10*3/uL — ABNORMAL HIGH (ref 3.8–10.8)

## 2023-12-25 LAB — CULTURE, BLOOD (ROUTINE X 2)
Culture: NO GROWTH
Special Requests: ADEQUATE

## 2023-12-25 LAB — BASIC METABOLIC PANEL WITH GFR
BUN: 13 mg/dL (ref 7–25)
CO2: 24 mmol/L (ref 20–32)
Calcium: 8.4 mg/dL — ABNORMAL LOW (ref 8.6–10.3)
Chloride: 106 mmol/L (ref 98–110)
Creat: 1 mg/dL (ref 0.70–1.28)
Glucose, Bld: 139 mg/dL — ABNORMAL HIGH (ref 65–99)
Potassium: 4.2 mmol/L (ref 3.5–5.3)
Sodium: 139 mmol/L (ref 135–146)
eGFR: 79 mL/min/{1.73_m2} (ref >=60)

## 2023-12-25 MED ORDER — ATORVASTATIN CALCIUM 10 MG PO TABS: 10.0000 mg | ORAL_TABLET | Freq: Every day | ORAL | 3 refills | Status: AC

## 2023-12-25 MED ORDER — LOSARTAN POTASSIUM 50 MG PO TABS: 50.0000 mg | ORAL_TABLET | Freq: Every day | ORAL | 3 refills | Status: AC

## 2023-12-25 MED ORDER — BENZONATATE 200 MG PO CAPS: 200.0000 mg | ORAL_CAPSULE | Freq: Two times a day (BID) | ORAL | 0 refills | Status: DC | PRN

## 2023-12-25 NOTE — Progress Notes (Signed)
 Patient ID: Chase Moreno, male    DOB: 1951/04/19, 73 y.o.   MRN: [id number]  This visit was conducted in person.  BP 130/80 (BP Location: Right Arm, Patient Position: Sitting, Cuff Size: Large)   Pulse 97   Temp 97.7 F (36.5 C) (Oral)   Wt 190 lb 12.8 oz (86.5 kg)   SpO2 95%   BMI 30.80 kg/m    CC:  Chief Complaint  Patient presents with   Hospitalization Follow-up    Subjective:   HPI: Chase Moreno is a 73 y.o. male presenting on 12/25/2023 for Hospitalization Follow-up  Recent hospitalization for CAP and sepsis. Seen after failing cefdinir. IN ED wbc 17  COVID and FLu test negative.  CXR showed right middle and lower lobe PNA   Treated with IV ceftriaxone  and started on azithromycin  course. Sent home levofloxacin  750 daily x    Blood cultures negative to date.  Today he reports  he is 100% better.  Better appetite. Eating better, good water intake  Still fatigue.  NO SOB, still with a cough. Dry, occ clear mucus.  Using robitussin for cough.  No further fever. UOP normal.     Relevant past medical, surgical, family and social history reviewed and updated as indicated. Interim medical history since our last visit reviewed. Allergies and medications reviewed and updated. Outpatient Medications Prior to Visit  Medication Sig Dispense Refill   aspirin  EC 81 MG tablet Take 81 mg by mouth daily. Swallow whole.     guaiFENesin -dextromethorphan  (ROBITUSSIN DM) 100-10 MG/5ML syrup Take 15 mLs by mouth every 8 (eight) hours for 5 days. 225 mL 0   levofloxacin  (LEVAQUIN ) 750 MG tablet Take 1 tablet (750 mg total) by mouth daily for 5 days. 3 tablet 0   atorvastatin  (LIPITOR) 10 MG tablet Take 1 tablet (10 mg total) by mouth daily. 90 tablet 1   losartan  (COZAAR ) 50 MG tablet Take 1 tablet by mouth once daily 90 tablet 1   No facility-administered medications prior to visit.     Per HPI unless specifically indicated in ROS section below Review of Systems   Constitutional:  Negative for fatigue and fever.  HENT:  Negative for ear pain.   Eyes:  Negative for pain.  Respiratory:  Negative for cough and shortness of breath.   Cardiovascular:  Negative for chest pain, palpitations and leg swelling.  Gastrointestinal:  Negative for abdominal pain.  Genitourinary:  Negative for dysuria.  Musculoskeletal:  Negative for arthralgias.  Neurological:  Negative for syncope, light-headedness and headaches.  Psychiatric/Behavioral:  Negative for dysphoric mood.    Objective:  BP 130/80 (BP Location: Right Arm, Patient Position: Sitting, Cuff Size: Large)   Pulse 97   Temp 97.7 F (36.5 C) (Oral)   Wt 190 lb 12.8 oz (86.5 kg)   SpO2 95%   BMI 30.80 kg/m   Wt Readings from Last 3 Encounters:  01/26/24 195 lb (88.5 kg)  01/01/24 194 lb (88 kg)  12/25/23 190 lb 12.8 oz (86.5 kg)      Physical Exam Vitals reviewed.  Constitutional:      Appearance: He is well-developed.  HENT:     Head: Normocephalic.     Right Ear: Hearing normal.     Left Ear: Hearing normal.     Nose: Nose normal.  Neck:     Thyroid: No thyroid mass or thyromegaly.     Vascular: No carotid bruit.     Trachea: Trachea normal.  Cardiovascular:  Rate and Rhythm: Normal rate and regular rhythm.     Pulses: Normal pulses.     Heart sounds: Heart sounds not distant. No murmur heard.    No friction rub. No gallop.     Comments: No peripheral edema Pulmonary:     Effort: Pulmonary effort is normal. No respiratory distress.     Breath sounds: Normal breath sounds.  Skin:    General: Skin is warm and dry.     Findings: No rash.  Psychiatric:        Speech: Speech normal.        Behavior: Behavior normal.        Thought Content: Thought content normal.       Results for orders placed or performed in visit on 12/25/23  Basic Metabolic Panel   Collection Time: 12/25/23 11:01 AM  Result Value Ref Range   Glucose, Bld 139 (H) 65 - 99 mg/dL   BUN 13 7 - 25 mg/dL    Creat 8.99 9.29 - 8.71 mg/dL   eGFR 79 > OR = 60 fO/fpw/8.26f7   BUN/Creatinine Ratio SEE NOTE: 6 - 22 (calc)   Sodium 139 135 - 146 mmol/L   Potassium 4.2 3.5 - 5.3 mmol/L   Chloride 106 98 - 110 mmol/L   CO2 24 20 - 32 mmol/L   Calcium  8.4 (L) 8.6 - 10.3 mg/dL  CBC with Differential/Platelet   Collection Time: 12/25/23 11:01 AM  Result Value Ref Range   WBC 12.3 (H) 3.8 - 10.8 Thousand/uL   RBC 4.49 4.20 - 5.80 Million/uL   Hemoglobin 12.2 (L) 13.2 - 17.1 g/dL   HCT 61.5 (L) 61.4 - 49.9 %   MCV 85.5 80.0 - 100.0 fL   MCH 27.2 27.0 - 33.0 pg   MCHC 31.8 (L) 32.0 - 36.0 g/dL   RDW 85.5 88.9 - 84.9 %   Platelets 346 140 - 400 Thousand/uL   MPV 10.7 7.5 - 12.5 fL   Neutro Abs 9,766 (H) 1,500 - 7,800 cells/uL   Absolute Lymphocytes 1,156 850 - 3,900 cells/uL   Absolute Monocytes 959 (H) 200 - 950 cells/uL   Eosinophils Absolute 357 15 - 500 cells/uL   Basophils Absolute 62 0 - 200 cells/uL   Neutrophils Relative % 79.4 %   Total Lymphocyte 9.4 %   Monocytes Relative 7.8 %   Eosinophils Relative 2.9 %   Basophils Relative 0.5 %   Smear Review      Assessment and Plan  Community acquired pneumonia of right lower lobe of lung Assessment & Plan: Acute, now significant improvement status post antibiotics.  Blood pressure stable.  He has been compliant with and has completed his antibiotic course. He does still have persistent postinfectious cough.  Will refill benzonatate . Reevaluate CBC and be met to make sure electrolytes and white blood cell count return to normal range.  Return and ER precautions provided.  Orders: -     Basic metabolic panel with GFR -     CBC with Differential/Platelet  Hypertension associated with diabetes (HCC) Assessment & Plan: Stable. -Resume losartan    Hyperlipidemia associated with type 2 diabetes mellitus (HCC) Assessment & Plan:  LDL at goal < 100 on  atorvastatin  10 mg daily   Other orders -     Atorvastatin  Calcium ; Take 1 tablet  (10 mg total) by mouth daily.  Dispense: 90 tablet; Refill: 3 -     Losartan  Potassium; Take 1 tablet (50 mg total) by mouth daily.  Dispense:  90 tablet; Refill: 3 -     Benzonatate ; Take 1 capsule (200 mg total) by mouth 2 (two) times daily as needed for cough. (Patient not taking: Reported on 01/26/2024)  Dispense: 20 capsule; Refill: 0    No follow-ups on file.   Greig Ring, MD

## 2023-12-26 ENCOUNTER — Ambulatory Visit: Payer: Self-pay | Admitting: Family Medicine

## 2023-12-29 ENCOUNTER — Telehealth: Payer: Self-pay | Admitting: Family Medicine

## 2023-12-29 NOTE — Telephone Encounter (Signed)
 Copied from CRM [id number]. Topic: General - Call Back - No Documentation >> Dec 29, 2023 11:41 AM Chase Moreno wrote: Reason for CRM: Patient returning call from [facility] regarding lab work. Requesting a call back.

## 2023-12-29 NOTE — Telephone Encounter (Signed)
 Pt given lab results per notes of Dr. Cherlyn Cornet from 12/26/23 on 12/29/23. Pt  and wife verbalized understanding labs results are improving.       Copied from CRM [id number]. Topic: Clinical - Lab/Test Results >> Dec 29, 2023 11:48 AM Everlene Hobby D wrote: Patient would like to talk speak with nurse about results to see what's going on

## 2023-12-29 NOTE — Telephone Encounter (Signed)
 Copied from CRM [id number]. Topic: Clinical - Lab/Test Results >> Dec 29, 2023  8:15 AM Howard Macho wrote: Reason for CRM: patient called stating he is returning a call to the nurse. I read the results to him but he states that he does not understand them CB [phone]

## 2023-12-31 ENCOUNTER — Telehealth: Payer: Medicare (Managed Care)

## 2024-01-01 ENCOUNTER — Other Ambulatory Visit: Payer: Self-pay

## 2024-01-02 NOTE — Patient Instructions (Signed)
 Visit Information  Thank you for taking time to visit with me today. Please don't hesitate to contact me if I can be of assistance to you before our next scheduled telephone appointment.  Our next appointment is by telephone on January 08, 2024 at 11:00 AM  Following is a copy of your care plan:   Goals Addressed             This Visit's Progress    VBCI Transitions of Care (TOC) Care Plan       Problems:  Recent Hospitalization for treatment of Pulmonary Disease and Sepsis pneumonia Knowledge Deficit Related to disease process and follow up needs for post hospital  Goal:  Over the next 30 days, the patient will not experience hospital readmission  Interventions:  Transitions of Care: Doctor Visits  - discussed the importance of doctor visits Reviewed Signs and symptoms of infection  Patient Self Care Activities:  Attend all scheduled provider appointments Call pharmacy for medication refills 3-7 days in advance of running out of medications Call provider office for new concerns or questions  Notify RN Care Manager of TOC call rescheduling needs Participate in Transition of Care Program/Attend TOC scheduled calls  Plan:  Follow up with provider re: copy of his After Visit Summary from hospital; reviewed with patient and offered to mail, states they wish to get it from PCP visit tomorrow's appointment. The care management team will reach out to the patient again over the next 7 -10 business days. The patient has been provided with contact information for the care management team and has been advised to call with any health related questions or concerns.  Monitor Call MD  Follow with PCP about diabetes; currently controlled by diet        The patient verbalized understanding of instructions, educational materials, and care plan provided today and DECLINED offer to receive copy of patient instructions, educational materials, and care plan.   Telephone follow up appointment  with care management team member scheduled for: The patient has been provided with contact information for the care management team and has been advised to call with any health related questions or concerns.  The care management team will reach out to the patient again over the next 7- 10 days.   Please call the care guide team at [phone] if you need to cancel or reschedule your appointment.   Please call the [place]  National Suicide Prevention Lifeline: 438-710-2697 or TTY: 5200369209 TTY (402)613-5576) to talk to a trained counselor if you are experiencing a Mental Health or Behavioral Health Crisis or need someone to talk to.  Brown Cape, RN, BSN, CCM [facility], [facility] RN Care Manager Direct Dial: [phone]

## 2024-01-02 NOTE — Transitions of Care (Post Inpatient/ED Visit) (Signed)
  Transition of Care Week 2  Visit Note  01/02/2024  Name: Chase Moreno MRN: [id number]          DOB: March 31, 1951  Situation: Patient enrolled in [facility] 30-day program. Visit completed with patient by telephone.   Background:   Initial Transition Care Management Follow-up Telephone Call    Past Medical History:  Diagnosis Date   Allergic rhinitis, cause unspecified    Cancer (HCC)    prostate cancer 2018   Diabetes mellitus without complication (HCC)    patient denies although dx in 2017 with a Dr Cherlyn Cornet ; last hgA1c was however 5.5    Unspecified essential hypertension    Unspecified sleep apnea    CPAP machine use     Assessment: Patient Reported Symptoms: Cognitive Cognitive Status: Alert and oriented to person, place, and time      Neurological Neurological Review of Symptoms: No symptoms reported    HEENT HEENT Symptoms Reported: No symptoms reported      Cardiovascular Cardiovascular Symptoms Reported: No symptoms reported Does patient have uncontrolled Hypertension?: Yes Is patient checking Blood Pressure at home?: No Patient's Recent BP reading at home: no Cardiovascular Management Strategies: Medication therapy, Exercise Weight: 194 lb (88 kg) (with clothes on) Cardiovascular Self-Management Outcome: 4 (good)  Respiratory Respiratory Symptoms Reported: Productive cough Other Respiratory Symptoms: much less    Endocrine Patient reports the following symptoms related to hypoglycemia or hyperglycemia : No symptoms reported Is patient diabetic?: Yes Is patient checking blood sugars at home?: No Endocrine Conditions: Diabetes  Gastrointestinal Gastrointestinal Symptoms Reported: No symptoms reported      Genitourinary Genitourinary Symptoms Reported: No symptoms reported    Integumentary Integumentary Symptoms Reported: No symptoms reported    Musculoskeletal Musculoskelatal Symptoms Reviewed: No symptoms reported Additional Musculoskeletal Details: much  better Musculoskeletal Management Strategies: Activity, Exercise Musculoskeletal Self-Management Outcome: 4 (good) Musculoskeletal Comment: much better Falls in the past year?: No Number of falls in past year: 1 or less Was there an injury with Fall?: No Fall Risk Category Calculator: 0 Patient Fall Risk Level: Low Fall Risk    Psychosocial Psychosocial Symptoms Reported: No symptoms reported         There were no vitals filed for this visit.  Medications Reviewed Today     Reviewed by Jamie Mccoy, RN (Registered Nurse) on 01/01/24 at 1121  Med List Status: <None>   Medication Order Taking? Sig Documenting Provider Last Dose Status Informant  aspirin  EC 81 MG tablet [id number] Yes Take 81 mg by mouth daily. Swallow whole. [provider]  Active Self, Pharmacy Records  atorvastatin  (LIPITOR) 10 MG tablet [id number] Yes Take 1 tablet (10 mg total) by mouth daily. Judithann Novas, MD  Active   benzonatate  (TESSALON ) 200 MG capsule [id number] Yes Take 1 capsule (200 mg total) by mouth 2 (two) times daily as needed for cough. Judithann Novas, MD  Active   losartan  (COZAAR ) 50 MG tablet [id number] Yes Take 1 tablet (50 mg total) by mouth daily. Judithann Novas, MD  Active              Brown Cape, RN, BSN, CCM [facility], [facility] Health RN Care Manager Direct Dial: [phone]

## 2024-01-08 ENCOUNTER — Other Ambulatory Visit: Payer: Self-pay

## 2024-01-08 ENCOUNTER — Telehealth: Payer: Medicare (Managed Care)

## 2024-01-08 NOTE — Transitions of Care (Post Inpatient/ED Visit) (Signed)
  Transition of Care week 3  Visit Note  01/08/2024  Name: Chase Moreno MRN: [id number]          DOB: [birth year]  Situation: Patient enrolled in [facility] 30-day program. Visit completed with patient by telephone.   Background:   Initial Transition Care Management Follow-up Telephone Call    Past Medical History:  Diagnosis Date   Allergic rhinitis, cause unspecified    Cancer (HCC)    prostate cancer 2018   Diabetes mellitus without complication (HCC)    patient denies although dx in 2017 with a Dr Avelina ; last hgA1c was however 5.5    Unspecified essential hypertension    Unspecified sleep apnea    CPAP machine use     Assessment: Patient Reported Symptoms: Cognitive Cognitive Status: Alert and oriented to person, place, and time      Neurological Neurological Review of Symptoms: No symptoms reported Neurological Self-Management Outcome: 4 (good)  HEENT HEENT Symptoms Reported: No symptoms reported      Cardiovascular Cardiovascular Symptoms Reported: No symptoms reported Does patient have uncontrolled Hypertension?: Yes Is patient checking Blood Pressure at home?: No Patient's Recent BP reading at home: will start Cardiovascular Conditions: High blood cholesterol Cardiovascular Self-Management Outcome: 4 (good)  Respiratory Respiratory Symptoms Reported: No symptoms reported Other Respiratory Symptoms: much better Respiratory Self-Management Outcome: 4 (good)  Endocrine Patient reports the following symptoms related to hypoglycemia or hyperglycemia : No symptoms reported Is patient diabetic?: Yes (controlled by diet and exercise per patient) Is patient checking blood sugars at home?: No (Controlled by diet and exercise) Endocrine Conditions: Diabetes Endocrine Management Strategies: Diet modification, Exercise Endocrine Self-Management Outcome: 4 (good)  Gastrointestinal Gastrointestinal Symptoms Reported: No symptoms reported      Genitourinary Genitourinary  Symptoms Reported: No symptoms reported    Integumentary Integumentary Symptoms Reported: No symptoms reported    Musculoskeletal Musculoskelatal Symptoms Reviewed: No symptoms reported        Psychosocial Psychosocial Symptoms Reported: No symptoms reported           Medications Reviewed Today     Reviewed by Eilleen Richerd GRADE, RN (Registered Nurse) on 01/08/24 at 1102  Med List Status: <None>   Medication Order Taking? Sig Documenting Provider Last Dose Status Informant  aspirin  EC 81 MG tablet [id number] Yes Take 81 mg by mouth daily. Swallow whole. [provider]  Active Self, Pharmacy Records  atorvastatin  (LIPITOR) 10 MG tablet [id number] Yes Take 1 tablet (10 mg total) by mouth daily. Avelina Greig BRAVO, MD  Active   benzonatate  (TESSALON ) 200 MG capsule [id number]  Take 1 capsule (200 mg total) by mouth 2 (two) times daily as needed for cough.  Patient not taking: Reported on 01/08/2024   Avelina Greig BRAVO, MD  Active   losartan  (COZAAR ) 50 MG tablet [id number] Yes Take 1 tablet (50 mg total) by mouth daily. Avelina Greig BRAVO, MD  Active             Recommendation:   Re-schedule AWV   Richerd Eilleen, RN, BSN, CCM [facility], [facility] Health RN Care Manager Direct Dial: [phone]

## 2024-01-08 NOTE — Patient Instructions (Signed)
 Visit Information  Thank you for taking time to visit with me today. Please don't hesitate to contact me if I can be of assistance to you before our next scheduled telephone appointment.  Our next appointment is by telephone on 01/15/2024 at 12:00 N  Following is a copy of your care plan:   Goals Addressed             This Visit's Progress    VBCI Transitions of Care (TOC) Care Plan   On track    Problems: (01/08/24 Updated and Reviewed Richerd Fish, RN) Recent Hospitalization for treatment of Pulmonary Disease and Sepsis pneumonia Knowledge Deficit Related to disease process and follow up needs for post hospital  Goal:  Over the next 30 days, the patient will not experience hospital readmission  Interventions:  Transitions of Care: Doctor Visits  - discussed the importance of doctor visits Reviewed Signs and symptoms of infection Patient to monitor for ongoing s/s worsening with continued cough such as fever and increased shortness of breath Encourage patient to follow up on DTap vaccinations, **patient had recent cataract surgery encouraged diabetic eye exam and importance even though diabetes is currently diet controlled  Patient Self Care Activities:  Attend all scheduled provider appointments Call pharmacy for medication refills 3-7 days in advance of running out of medications Call provider office for new concerns or questions  Notify RN Care Manager of [facility] call rescheduling needs Participate in Transition of Care Program/Attend TOC scheduled calls Read labels for low sodium diet needs  Plan:  Follow up with PCP for questions Continue with low sodium heart health carb diet Follow with PCP about diabetes; currently controlled by diet last A1C 6.2, 01/08/24 encouraged ongoing follow to check diabetes        The patient verbalized understanding of instructions, educational materials, and care plan provided today and DECLINED offer to receive copy of patient  instructions, educational materials, and care plan.   Used teach back method with patient to review ongoing health care goals and reviewed for Care Gaps for DTaP, Hgb A1C and Diabetic eye exam.  The patient has been provided with contact information for the care management team and has been advised to call with any health related questions or concerns.  The care management team will reach out to the patient again over the next 7- 10 business days.  The patient will call provider for any symptoms that return* as advised to follow up.  Follow up with provider re: rescheduled AWV (was nissed on 01/05/24 noted) Please call the care guide team at [phone] if you need to cancel or reschedule your appointment.   Please call the [place]  National Suicide Prevention Lifeline: 628-682-3542 or TTY: 8572350930 TTY [phone]) to talk to a trained counselor if you are experiencing a Mental Health or Behavioral Health Crisis or need someone to talk to. Richerd Fish, RN, BSN, CCM [facility], [facility] Health RN Care Manager Direct Dial: [phone]

## 2024-01-15 ENCOUNTER — Telehealth: Payer: Medicare (Managed Care)

## 2024-01-26 ENCOUNTER — Telehealth: Payer: Self-pay

## 2024-01-26 NOTE — Patient Instructions (Signed)
 Visit Information  Thank you for taking time to visit with me today. Please don't hesitate to contact me if I can be of assistance to you before our next scheduled telephone appointment.  Completed 30 day, no further needs he states, as longitudinal CM offered but states he feels he doing fine and appreciated the call after hospital stay.  Following is a copy of your care plan:   Goals Addressed             This Visit's Progress    VBCI Transitions of Care (TOC) Care Plan   On track    Problems: (01/26/24 Updated and Reviewed Chase Fish, RN) Recent Hospitalization for treatment of Pulmonary Disease and Sepsis pneumonia Knowledge Deficit Related to disease process and follow up needs for post hospital Encouraged to obtain BP cuff and monitor BP  Goal:  Over the next 30 days, the patient will not experience hospital readmission  Interventions:  Transitions of Care: Doctor Visits  - discussed the importance of doctor visits Reviewed Signs and symptoms of infection Patient to monitor for ongoing s/s worsening with continued cough such as fever and increased shortness of breath Encourage patient to follow up on DTap vaccinations, **patient had recent cataract surgery encouraged diabetic eye exam and importance even though diabetes is currently diet controlled  Patient Self Care Activities:  Attend all scheduled provider appointments Call pharmacy for medication refills 3-7 days in advance of running out of medications Call provider office for new concerns or questions  Notify RN Care Manager of [facility] call rescheduling needs Participate in Transition of Care Program/Attend TOC scheduled calls Read labels for low sodium diet needs  Plan:  Follow up with PCP for questions Continue with low sodium heart health carb diet 01/26/24 Encouraged and agrees to plan Follow with PCP about diabetes; currently controlled by diet last A1C 6.2, 01/08/24 encouraged ongoing follow to check  diabetes Patient to re-scheduled AWV, he was hospitalized during that time was due in June. Patient has had his eye exam, encouraged to update this information at AWV with PCP, patient agrees        The patient verbalized understanding of instructions, educational materials, and care plan provided today and agreed to receive a mailed copy of patient instructions, educational materials, and care plan.   Next AWV (Annual Wellness Visit) scheduled for:  Patient states he will call and get the appointment set up that was due in June, 2025.  Please call the care guide team at [phone] if you need to cancel or reschedule your appointment.   Please call the [place]  National Suicide Prevention Lifeline: 986-058-7441 or TTY: 8380959678 TTY [phone]) to talk to a trained counselor if you are experiencing a Mental Health or Behavioral Health Crisis or need someone to talk to.  Chase Fish, RN, BSN, CCM [facility], [facility] Health RN Care Manager Direct Dial: [phone]

## 2024-01-26 NOTE — Assessment & Plan Note (Signed)
Stable. -Resume losartan

## 2024-01-26 NOTE — Assessment & Plan Note (Signed)
  LDL at goal < 100 on  atorvastatin  10 mg daily

## 2024-01-26 NOTE — Transitions of Care (Post Inpatient/ED Visit) (Signed)
 Transition of Care Week 5 final  Visit Note  01/26/2024  Name: Chase Moreno MRN: [id number]          DOB: [birth year]  Situation: Patient enrolled in [facility] 30-day program. Visit completed with patient by telephone.   Background:   Initial Transition Care Management Follow-up Telephone Call    Past Medical History:  Diagnosis Date   Allergic rhinitis, cause unspecified    Cancer (HCC)    prostate cancer 2018   Diabetes mellitus without complication (HCC)    patient denies although dx in 2017 with a Dr Avelina ; last hgA1c was however 5.5    Unspecified essential hypertension    Unspecified sleep apnea    CPAP machine use     Assessment: Patient Reported Symptoms: Cognitive Cognitive Status: Alert and oriented to person, place, and time   Health Maintenance Behaviors: Exercise, Healthy diet, Social activities Healing Pattern: Average  Neurological Neurological Review of Symptoms: No symptoms reported Neurological Self-Management Outcome: 4 (good)  HEENT HEENT Symptoms Reported: No symptoms reported HEENT Self-Management Outcome: 4 (good)    Cardiovascular Cardiovascular Symptoms Reported: No symptoms reported Does patient have uncontrolled Hypertension?: Yes Is patient checking Blood Pressure at home?: No (Patient encouraged to get BP cuff and agrees he will, just hadn't gotten it yet.) Cardiovascular Management Strategies: Medication therapy Weight: 195 lb (88.5 kg) (home monitoring) Cardiovascular Self-Management Outcome: 4 (good) Cardiovascular Comment: No problems  Respiratory Respiratory Symptoms Reported: No symptoms reported Other Respiratory Symptoms: Having no problems or symptoms at all Respiratory Management Strategies: Activity, Medication therapy Respiratory Self-Management Outcome: 4 (good)  Endocrine Endocrine Symptoms Reported: No symptoms reported Is patient diabetic?: Yes Is patient checking blood sugars at home?: No    Gastrointestinal  Gastrointestinal Symptoms Reported: No symptoms reported Gastrointestinal Self-Management Outcome: 4 (good)    Genitourinary Genitourinary Symptoms Reported: No symptoms reported Genitourinary Self-Management Outcome: 4 (good) (HX of prostate CA) Genitourinary Comment:  everything's good  Integumentary Integumentary Symptoms Reported: No symptoms reported    Musculoskeletal Musculoskelatal Symptoms Reviewed: No symptoms reported Musculoskeletal Management Strategies: Activity, Exercise Musculoskeletal Self-Management Outcome: 4 (good)      Psychosocial Psychosocial Symptoms Reported: No symptoms reported Additional Psychological Details: Denies any issues Behavioral Health Self-Management Outcome: 4 (good)       There were no vitals filed for this visit.  Medications Reviewed Today     Reviewed by Eilleen Richerd GRADE, RN (Registered Nurse) on 01/26/24 at 0920  Med List Status: <None>   Medication Order Taking? Sig Documenting Provider Last Dose Status Informant  aspirin  EC 81 MG tablet [id number]  Take 81 mg by mouth daily. Swallow whole. [provider]  Active Self, Pharmacy Records  atorvastatin  (LIPITOR) 10 MG tablet [id number]  Take 1 tablet (10 mg total) by mouth daily. Avelina Greig BRAVO, MD  Active   benzonatate  (TESSALON ) 200 MG capsule [id number]  Take 1 capsule (200 mg total) by mouth 2 (two) times daily as needed for cough.  Patient not taking: Reported on 01/26/2024   Avelina Greig BRAVO, MD  Active   losartan  (COZAAR ) 50 MG tablet [id number]  Take 1 tablet (50 mg total) by mouth daily. Avelina Greig BRAVO, MD  Active             Recommendation:   PCP Follow-up for rescheduling AVS.  Follow Up Plan:   Patient has met all care management goals. Care Management case will be closed. Patient has been provided contact information should new needs arise.  Patient declined longitudinal complex  care management, states needs are met.  Richerd Fish, RN, BSN, CCM [facility], [facility] Health RN Care Manager Direct Dial: [phone]

## 2024-01-26 NOTE — Assessment & Plan Note (Signed)
 Acute, now significant improvement status post antibiotics.  Blood pressure stable.  He has been compliant with and has completed his antibiotic course. He does still have persistent postinfectious cough.  Will refill benzonatate . Reevaluate CBC and be met to make sure electrolytes and white blood cell count return to normal range.  Return and ER precautions provided.

## 2024-02-02 ENCOUNTER — Other Ambulatory Visit: Payer: Self-pay | Admitting: Family Medicine

## 2024-02-02 ENCOUNTER — Telehealth: Payer: Self-pay

## 2024-02-02 DIAGNOSIS — Z8546 Personal history of malignant neoplasm of prostate: Secondary | ICD-10-CM

## 2024-02-02 DIAGNOSIS — E1159 Type 2 diabetes mellitus with other circulatory complications: Secondary | ICD-10-CM

## 2024-02-02 DIAGNOSIS — E1169 Type 2 diabetes mellitus with other specified complication: Secondary | ICD-10-CM

## 2024-02-02 NOTE — Addendum Note (Signed)
 Addended by: WENDELL ARLAND RAMAN on: 02/02/2024 11:48 AM   Modules accepted: Orders

## 2024-02-02 NOTE — Telephone Encounter (Signed)
 Copied from CRM [id number]. Topic: Clinical - Lab/Test Results >> Feb 02, 2024  8:26 AM Emylou G wrote: Reason for CRM: scheduled phys 7/29 - would like to order labs.SABRA

## 2024-02-02 NOTE — Telephone Encounter (Signed)
 Please schedule fasting labs appointment prior to his CPE on 02/10/2024.  Order in [place].

## 2024-02-03 ENCOUNTER — Other Ambulatory Visit (INDEPENDENT_AMBULATORY_CARE_PROVIDER_SITE_OTHER): Payer: Medicare (Managed Care)

## 2024-02-03 DIAGNOSIS — E1159 Type 2 diabetes mellitus with other circulatory complications: Secondary | ICD-10-CM | POA: Diagnosis not present

## 2024-02-03 DIAGNOSIS — E1169 Type 2 diabetes mellitus with other specified complication: Secondary | ICD-10-CM | POA: Diagnosis not present

## 2024-02-03 DIAGNOSIS — Z8546 Personal history of malignant neoplasm of prostate: Secondary | ICD-10-CM | POA: Diagnosis not present

## 2024-02-03 DIAGNOSIS — E785 Hyperlipidemia, unspecified: Secondary | ICD-10-CM

## 2024-02-03 LAB — COMPREHENSIVE METABOLIC PANEL WITH GFR
ALT: 21 U/L (ref 0–53)
AST: 24 U/L (ref 0–37)
Albumin: 3.4 g/dL — ABNORMAL LOW (ref 3.5–5.2)
Alkaline Phosphatase: 65 U/L (ref 39–117)
BUN: 15 mg/dL (ref 6–23)
CO2: 28 meq/L (ref 19–32)
Calcium: 8.7 mg/dL (ref 8.4–10.5)
Chloride: 107 meq/L (ref 96–112)
Creatinine, Ser: 1.09 mg/dL (ref 0.40–1.50)
GFR: 67.12 mL/min (ref >60.00)
Glucose, Bld: 105 mg/dL — ABNORMAL HIGH (ref 70–99)
Potassium: 4.3 meq/L (ref 3.5–5.1)
Sodium: 142 meq/L (ref 135–145)
Total Bilirubin: 0.3 mg/dL (ref 0.2–1.2)
Total Protein: 7.2 g/dL (ref 6.0–8.3)

## 2024-02-03 LAB — MICROALBUMIN / CREATININE URINE RATIO
Creatinine,U: 149.9 mg/dL
Microalb Creat Ratio: 12.7 mg/g (ref 0.0–30.0)
Microalb, Ur: 1.9 mg/dL (ref 0.0–1.9)

## 2024-02-03 LAB — LIPID PANEL
Cholesterol: 116 mg/dL (ref 0–200)
HDL: 29.8 mg/dL — ABNORMAL LOW (ref >39.00)
LDL Cholesterol: 74 mg/dL (ref 0–99)
NonHDL: 86.22
Total CHOL/HDL Ratio: 4
Triglycerides: 62 mg/dL (ref 0.0–149.0)
VLDL: 12.4 mg/dL (ref 0.0–40.0)

## 2024-02-03 LAB — PSA: PSA: 0.01 ng/mL — ABNORMAL LOW (ref 0.10–4.00)

## 2024-02-03 LAB — HEMOGLOBIN A1C: Hgb A1c MFr Bld: 6.5 % (ref 4.6–6.5)

## 2024-02-05 ENCOUNTER — Ambulatory Visit: Payer: Self-pay | Admitting: Family Medicine

## 2024-02-05 NOTE — Progress Notes (Signed)
 No critical labs need to be addressed urgently. We will discuss labs in detail at upcoming office visit.

## 2024-02-10 ENCOUNTER — Ambulatory Visit (INDEPENDENT_AMBULATORY_CARE_PROVIDER_SITE_OTHER): Payer: Medicare (Managed Care) | Admitting: Family Medicine

## 2024-02-10 ENCOUNTER — Encounter: Payer: Self-pay | Admitting: Family Medicine

## 2024-02-10 ENCOUNTER — Other Ambulatory Visit: Payer: Self-pay

## 2024-02-10 VITALS — BP 120/80 | HR 81 | Temp 98.0°F | Ht 65.75 in | Wt 190.5 lb

## 2024-02-10 DIAGNOSIS — H6121 Impacted cerumen, right ear: Secondary | ICD-10-CM | POA: Diagnosis not present

## 2024-02-10 DIAGNOSIS — E66811 Obesity, class 1: Secondary | ICD-10-CM | POA: Diagnosis not present

## 2024-02-10 DIAGNOSIS — E1169 Type 2 diabetes mellitus with other specified complication: Secondary | ICD-10-CM | POA: Diagnosis not present

## 2024-02-10 DIAGNOSIS — E1159 Type 2 diabetes mellitus with other circulatory complications: Secondary | ICD-10-CM

## 2024-02-10 DIAGNOSIS — Z6831 Body mass index (BMI) 31.0-31.9, adult: Secondary | ICD-10-CM

## 2024-02-10 DIAGNOSIS — Z0000 Encounter for general adult medical examination without abnormal findings: Secondary | ICD-10-CM

## 2024-02-10 DIAGNOSIS — I152 Hypertension secondary to endocrine disorders: Secondary | ICD-10-CM | POA: Diagnosis not present

## 2024-02-10 DIAGNOSIS — E6609 Other obesity due to excess calories: Secondary | ICD-10-CM | POA: Diagnosis not present

## 2024-02-10 DIAGNOSIS — E785 Hyperlipidemia, unspecified: Secondary | ICD-10-CM | POA: Diagnosis not present

## 2024-02-10 DIAGNOSIS — G4733 Obstructive sleep apnea (adult) (pediatric): Secondary | ICD-10-CM | POA: Diagnosis not present

## 2024-02-10 DIAGNOSIS — Z8546 Personal history of malignant neoplasm of prostate: Secondary | ICD-10-CM | POA: Diagnosis not present

## 2024-02-10 LAB — HM DIABETES FOOT EXAM

## 2024-02-10 MED ORDER — BOOSTRIX 5-2.5-18.5 LF-MCG/0.5 IM SUSY
PREFILLED_SYRINGE | INTRAMUSCULAR | 0 refills | Status: DC
  Filled 2024-02-10: qty 0.5, 1d supply, fill #0

## 2024-02-10 NOTE — Assessment & Plan Note (Signed)
 Encouraged exercise, weight loss, healthy eating habits. ? ?

## 2024-02-10 NOTE — Progress Notes (Signed)
 Patient ID: Chase Moreno, male    DOB: Aug 25, 1950, 73 y.o.   MRN: [id number]  This visit was conducted in person.  BP 120/80   Pulse 81   Temp 98 F (36.7 C) (Temporal)   Ht 5' 5.75 (1.67 m)   Wt 190 lb 8 oz (86.4 kg)   SpO2 98%   BMI 30.98 kg/m    CC:  Chief Complaint  Patient presents with   Annual Exam    MWV scheduled for 03/04/2024    Subjective:   HPI: Chase Moreno is a 73 y.o. male presenting on 02/10/2024 for Annual Exam (MWV scheduled for 03/04/2024)  The patient presents for  complete physical and review of chronic health problems. He/She also has the following acute concerns today:  none  The patient is scheduled for medicare wellness visit  Elevated Cholesterol:  LDL previously at goal < 100 on  atorvastatin  10 mg daily in past. Lab Results  Component Value Date   CHOL 116 02/03/2024   HDL 29.80 (L) 02/03/2024   LDLCALC 74 02/03/2024   LDLDIRECT 169.8 02/24/2012   TRIG 62.0 02/03/2024   CHOLHDL 4 02/03/2024  Using medications without problems: none Muscle aches:  none Diet compliance: healthy eating. Exercise: walking daily, very active Other complaints:  Hypertension:  At goal on losartan  50 mg p.o. daily BP Readings from Last 3 Encounters:  02/10/24 120/80  12/25/23 130/80  12/23/23 (!) 149/89  Using medication without problems or lightheadedness:  none Chest pain with exertion: none Edema: none Short of breath: none Average home BPs: Other issues:  Diabetes: Diet controlled in past.. Lab Results  Component Value Date   HGBA1C 6.5 02/03/2024  Using medications without difficulties: Hypoglycemic episodes: Hyperglycemic episodes: Feet problems: no ulcers Blood Sugars averaging: eye exam within last year:due  Obesity causing OSA and DM.. stable on CPAP    Wt Readings from Last 3 Encounters:  02/10/24 190 lb 8 oz (86.4 kg)  01/26/24 195 lb (88.5 kg)  01/01/24 194 lb (88 kg)     Relevant past medical, surgical, family and social  history reviewed and updated as indicated. Interim medical history since our last visit reviewed. Allergies and medications reviewed and updated. Outpatient Medications Prior to Visit  Medication Sig Dispense Refill   aspirin  EC 81 MG tablet Take 81 mg by mouth daily. Swallow whole.     atorvastatin  (LIPITOR) 10 MG tablet Take 1 tablet (10 mg total) by mouth daily. 90 tablet 3   losartan  (COZAAR ) 50 MG tablet Take 1 tablet (50 mg total) by mouth daily. 90 tablet 3   benzonatate  (TESSALON ) 200 MG capsule Take 1 capsule (200 mg total) by mouth 2 (two) times daily as needed for cough. (Patient not taking: Reported on 01/26/2024) 20 capsule 0   No facility-administered medications prior to visit.     Per HPI unless specifically indicated in ROS section below Review of Systems  Constitutional:  Negative for fatigue and fever.  HENT:  Negative for ear pain.   Eyes:  Negative for pain.  Respiratory:  Negative for cough and shortness of breath.   Cardiovascular:  Negative for chest pain, palpitations and leg swelling.  Gastrointestinal:  Negative for abdominal pain.  Genitourinary:  Negative for dysuria.  Musculoskeletal:  Negative for arthralgias.  Neurological:  Negative for syncope, light-headedness and headaches.  Psychiatric/Behavioral:  Negative for dysphoric mood.    Objective:  BP 120/80   Pulse 81   Temp 98 F (36.7 C) (Temporal)  Ht 5' 5.75 (1.67 m)   Wt 190 lb 8 oz (86.4 kg)   SpO2 98%   BMI 30.98 kg/m   Wt Readings from Last 3 Encounters:  02/10/24 190 lb 8 oz (86.4 kg)  01/26/24 195 lb (88.5 kg)  01/01/24 194 lb (88 kg)      Physical Exam Constitutional:      Appearance: He is well-developed.  HENT:     Head: Normocephalic.     Right Ear: Hearing normal. There is impacted cerumen.     Left Ear: Hearing, tympanic membrane, ear canal and external ear normal.  No middle ear effusion.     Nose: Nose normal.  Neck:     Thyroid: No thyroid mass or thyromegaly.      Vascular: No carotid bruit.     Trachea: Trachea normal.  Cardiovascular:     Rate and Rhythm: Normal rate and regular rhythm.     Pulses: Normal pulses.     Heart sounds: Heart sounds not distant. No murmur heard.    No friction rub. No gallop.     Comments: No peripheral edema Pulmonary:     Effort: Pulmonary effort is normal. No respiratory distress.     Breath sounds: Normal breath sounds.  Skin:    General: Skin is warm and dry.     Findings: No rash.  Psychiatric:        Speech: Speech normal.        Behavior: Behavior normal.        Thought Content: Thought content normal.    Diabetic foot exam: Normal inspection No skin breakdown No calluses  Normal DP pulses Normal sensation to light touch and monofilament Nails normal     Results for orders placed or performed in visit on 02/10/24  HM DIABETES FOOT EXAM   Collection Time: 02/10/24 12:00 AM  Result Value Ref Range   HM Diabetic Foot Exam done      COVID 19 screen:  No recent travel or known exposure to COVID19 The patient denies respiratory symptoms of COVID 19 at this time. The importance of social distancing was discussed today.   Assessment and Plan   The patient's preventative maintenance and recommended screening tests for an annual wellness exam were reviewed in full today. Brought up to date unless services declined.  Counselled on the importance of diet, exercise, and its role in overall health and mortality. The patient's FH and SH was reviewed, including their home life, tobacco status, and drug and alcohol status.   Vaccines: uptodate flu and  PNA,  due for td .SABRA Will send to pharmacy clinic in office today.   Colon: 10/09/2007 Dr. Aneita, polyps,  Cologuard 2024 negative Hep C: neg  Nonsmoker Prostate:  02/2017 S/P  Robotic assisted prostatectomy followed by Dr. Renda.  Yearly visit in past... none  since 2022..  Lab Results  Component Value Date   PSA 0.01 (L) 02/03/2024   PSA 0.01 (L)  02/04/2023   PSA 0.01 (L) 05/11/2021    ETOH: none    Problem List Items Addressed This Visit     Class 1 obesity due to excess calories with serious comorbidity and body mass index (BMI) of 31.0 to 31.9 in adult   Encouraged exercise, weight loss, healthy eating habits.       Hearing loss of right ear due to cerumen impaction   Cerumen impaction removal via irrigation Performed by :  Arland Morel, CMA Consent for procedure obtained verbally. Right  ear lavaged/ irrigated with warm water gently. Pt tolerated procedure well, with no complications. After procedure ears clear of cerumen impaction, with minimal ear canal irritation and redness, no bleeding. TM intact and symptoms improved.       History of prostate cancer    Followed by Urology Dr. Renda in past. S/p prostatectomy   Yearly PSA < 0.1      Hyperlipidemia associated with type 2 diabetes mellitus (HCC)   Stable, chronic.  Continue current medication.   LDL at goal < 100 on  atorvastatin  10 mg daily      Hypertension associated with diabetes (HCC)   Stable.  On  losartan  50 mg daily      OSA (obstructive sleep apnea)   stable on CPAP      Type 2 diabetes mellitus with other circulatory complications HTN (HCC)    Chronic Diet controlled.  Not on medication.        Other Visit Diagnoses       Routine general medical examination at a health care facility    -  Primary       Orders Placed This Encounter  Procedures   HM DIABETES FOOT EXAM    This external order was created through the Results Console.     Greig Ring, MD

## 2024-02-10 NOTE — Assessment & Plan Note (Signed)
stable on CPAP

## 2024-02-10 NOTE — Assessment & Plan Note (Signed)
 Chronic Diet controlled.  Not on medication.

## 2024-02-10 NOTE — Assessment & Plan Note (Signed)
 Stable.  On  losartan  50 mg daily

## 2024-02-10 NOTE — Assessment & Plan Note (Signed)
 Stable, chronic.  Continue current medication.   LDL at goal < 100 on  atorvastatin  10 mg daily

## 2024-02-10 NOTE — Assessment & Plan Note (Signed)
 Followed by Urology Dr. Renda in past. S/p prostatectomy   Yearly PSA < 0.1

## 2024-02-10 NOTE — Assessment & Plan Note (Signed)
 Cerumen impaction removal via irrigation Performed by :  Arland Morel, CMA Consent for procedure obtained verbally. Right ear lavaged/ irrigated with warm water gently. Pt tolerated procedure well, with no complications. After procedure ears clear of cerumen impaction, with minimal ear canal irritation and redness, no bleeding. TM intact and symptoms improved.

## 2024-02-20 ENCOUNTER — Ambulatory Visit: Payer: Self-pay | Admitting: Family Medicine

## 2024-02-20 ENCOUNTER — Encounter: Payer: Self-pay | Admitting: Family Medicine

## 2024-02-20 ENCOUNTER — Ambulatory Visit (INDEPENDENT_AMBULATORY_CARE_PROVIDER_SITE_OTHER)
Admission: RE | Admit: 2024-02-20 | Discharge: 2024-02-20 | Disposition: A | Discharge status: Home / Self Care | Payer: Medicare (Managed Care) | Source: Ambulatory Visit | Attending: Family Medicine | Admitting: Family Medicine

## 2024-02-20 ENCOUNTER — Ambulatory Visit: Payer: Self-pay | Admitting: *Deleted

## 2024-02-20 ENCOUNTER — Ambulatory Visit: Payer: Medicare (Managed Care) | Admitting: Family Medicine

## 2024-02-20 VITALS — BP 128/78 | HR 99 | Temp 100.0°F | Ht 65.75 in | Wt 193.2 lb

## 2024-02-20 DIAGNOSIS — R051 Acute cough: Secondary | ICD-10-CM | POA: Diagnosis not present

## 2024-02-20 DIAGNOSIS — R059 Cough, unspecified: Secondary | ICD-10-CM | POA: Diagnosis not present

## 2024-02-20 DIAGNOSIS — R509 Fever, unspecified: Secondary | ICD-10-CM | POA: Diagnosis not present

## 2024-02-20 LAB — POC COVID19 BINAXNOW: SARS Coronavirus 2 Ag: NEGATIVE

## 2024-02-20 LAB — POC INFLUENZA A&B (BINAX/QUICKVUE)
Influenza A, POC: NEGATIVE
Influenza B, POC: NEGATIVE

## 2024-02-20 MED ORDER — PREDNISONE 20 MG PO TABS: 20.0000 mg | ORAL_TABLET | Freq: Every day | ORAL | 0 refills | Status: DC

## 2024-02-20 MED ORDER — PROMETHAZINE-DM 6.25-15 MG/5ML PO SYRP: 5.0000 mL | ORAL_SOLUTION | Freq: Three times a day (TID) | ORAL | 0 refills | Status: DC | PRN

## 2024-02-20 NOTE — Progress Notes (Signed)
 Subjective:    Patient ID: Chase Moreno, male    DOB: 1951/07/07, 73 y.o.   MRN: [id number]  HPI  Wt Readings from Last 3 Encounters:  02/20/24 193 lb 4 oz (87.7 kg)  02/10/24 190 lb 8 oz (86.4 kg)  01/26/24 195 lb (88.5 kg)   31.43 kg/m  Vitals:   02/20/24 1525  BP: 128/78  Pulse: 99  Temp: 100 F (37.8 C)  SpO2: 96%    73 yo pt of Dr Avelina presents with uri symptoms , chills and decreased appetite  Including some cough with shortness of breath   Pmx of prostate cancer, type 2 DM   Per history had pna in mid June RLL and RML Was hospitalized and treatment with IV ceftriaxone  and azithro , discharged on levofloxacin   Cxr 6/8 Narrative & Impression  CLINICAL DATA:  Sepsis   EXAM: PORTABLE CHEST 1 VIEW   COMPARISON:  Chest x-ray performed October 03, 2015   FINDINGS: A peripheral airspace opacity is present in the right mid and lower lung zone. There is blunting of the right costophrenic angle. Low lung volumes. Thoracic aorta is ectatic.   IMPRESSION: 1. Peripheral airspace opacity in the right mid and lower lung zones. Differential considerations include pneumonia. Possible superimposed small right pleural effusion.   Recommend follow-up radiograph to demonstrate resolution.     These symptoms started 2 d ago  Coughing -mostly dry  Some wheezing -not unusual for him  Quit smoking many years ago   Felt chills this am / ? Fever  Achey  No appetite No loss of taste or smell  No nasal symptoms No ST  Ears are ok   No n/v No diarrhea No abd pain   Tessalon  has not helped in the past   Over the counter  Nothing   Results for orders placed or performed in visit on 02/20/24  POC COVID-19   Collection Time: 02/20/24  3:42 PM  Result Value Ref Range   SARS Coronavirus 2 Ag Negative Negative  POC Influenza A&B(BINAX/QUICKVUE)   Collection Time: 02/20/24  4:04 PM  Result Value Ref Range   Influenza A, POC Negative Negative   Influenza B, POC  Negative Negative      Lab Results  Component Value Date   WBC 12.3 (H) 12/25/2023   HGB 12.2 (L) 12/25/2023   HCT 38.4 (L) 12/25/2023   MCV 85.5 12/25/2023   PLT 346 12/25/2023   Lab Results  Component Value Date   NA 142 02/03/2024   K 4.3 02/03/2024   CO2 28 02/03/2024   GLUCOSE 105 (H) 02/03/2024   BUN 15 02/03/2024   CREATININE 1.09 02/03/2024   CALCIUM  8.7 02/03/2024   GFR 67.12 02/03/2024   EGFR 79 12/25/2023   GFRNONAA >60 12/22/2023     Cxr today DG Chest 2 View Result Date: 02/20/2024 CLINICAL DATA:  Nonproductive cough and low-grade fever. EXAM: CHEST - 2 VIEW COMPARISON:  12/21/2023 FINDINGS: Normal-sized heart. Tortuous aorta. Mild-to-moderate peribronchial thickening. No airspace consolidation. Small amount of linear density in both lower lung zones. Mild thoracic spine degenerative changes. IMPRESSION: 1. Mild-to-moderate bronchitic changes. 2. Small amount of linear atelectasis or scarring in both lower lung zones. Electronically Signed   By: Elspeth Bathe M.D.   On: 02/20/2024 16:03     Patient Active Problem List   Diagnosis Date Noted   Cough with fever 02/20/2024   Plantar fasciitis, right 08/08/2022   Tendonitis of left rotator cuff 01/16/2022  Class 1 obesity due to excess calories with serious comorbidity and body mass index (BMI) of 31.0 to 31.9 in adult 02/29/2020   Hearing loss of right ear due to cerumen impaction 06/15/2019   History of prostate cancer 02/17/2017   Microalbuminuria 07/29/2014   Type 2 diabetes mellitus with other circulatory complications HTN (HCC) 04/22/2014   Hyperlipidemia associated with type 2 diabetes mellitus (HCC) 02/28/2012   PERIODIC LIMB MOVEMENT DISORDER 01/26/2008   OSA (obstructive sleep apnea) 01/26/2008   Hypertension associated with diabetes (HCC) 09/08/2007   Allergic rhinitis 08/24/2007   Past Medical History:  Diagnosis Date   Allergic rhinitis, cause unspecified    Cancer (HCC)    prostate cancer  2018   Diabetes mellitus without complication (HCC)    patient denies although dx in 2017 with a Dr Avelina ; last hgA1c was however 5.5    Unspecified essential hypertension    Unspecified sleep apnea    CPAP machine use    Past Surgical History:  Procedure Laterality Date   FRACTURE SURGERY     left arm    LYMPHADENECTOMY Bilateral 02/17/2017   Procedure: LYMPHADENECTOMY;  Surgeon: Renda Glance, MD;  Location: [facility];  Service: Urology;  Laterality: Bilateral;   ROBOT ASSISTED LAPAROSCOPIC RADICAL PROSTATECTOMY N/A 02/17/2017   Procedure: XI ROBOTIC ASSISTED LAPAROSCOPIC RADICAL PROSTATECTOMY LEVEL 2;  Surgeon: Renda Glance, MD;  Location: [facility];  Service: Urology;  Laterality: N/A;   TONSILLECTOMY     Social History   Tobacco Use   Smoking status: Former   Smokeless tobacco: Never   Tobacco comments:    quit over 40 years ago  Vaping Use   Vaping status: Never Used  Substance Use Topics   Alcohol use: No    Alcohol/week: 0.0 standard drinks of alcohol   Drug use: No   Family History  Problem Relation Age of Onset   Arthritis Mother    Arrhythmia Mother    Stroke Paternal Grandmother    No Known Allergies Current Outpatient Medications on File Prior to Visit  Medication Sig Dispense Refill   aspirin  EC 81 MG tablet Take 81 mg by mouth daily. Swallow whole.     atorvastatin  (LIPITOR) 10 MG tablet Take 1 tablet (10 mg total) by mouth daily. 90 tablet 3   losartan  (COZAAR ) 50 MG tablet Take 1 tablet (50 mg total) by mouth daily. 90 tablet 3   Tdap (BOOSTRIX ) 5-2.5-18.5 LF-MCG/0.5 injection Inject into the muscle. 0.5 mL 0   No current facility-administered medications on file prior to visit.    Review of Systems  Constitutional:  Positive for activity change, appetite change and fever. Negative for unexpected weight change.  HENT:  Negative for congestion, ear pain, postnasal drip, rhinorrhea, sore throat and trouble swallowing.   Respiratory:  Positive for cough,  shortness of breath and wheezing.        Shortness of breath at times  Not now   Has some baseline wheezing-no more than usual   Gastrointestinal:  Negative for abdominal pain, diarrhea, nausea and vomiting.  Musculoskeletal:  Positive for myalgias.  Neurological:  Negative for dizziness and headaches.       Objective:   Physical Exam Constitutional:      General: He is not in acute distress.    Appearance: He is well-developed. He is obese. He is not ill-appearing or diaphoretic.  HENT:     Head: Normocephalic and atraumatic.     Right Ear: Tympanic membrane and ear canal normal.  Left Ear: Tympanic membrane and ear canal normal.     Nose: Nose normal.     Comments: Boggy nares     Mouth/Throat:     Mouth: Mucous membranes are moist.     Pharynx: No oropharyngeal exudate or posterior oropharyngeal erythema.  Eyes:     Conjunctiva/sclera: Conjunctivae normal.     Pupils: Pupils are equal, round, and reactive to light.  Cardiovascular:     Rate and Rhythm: Regular rhythm. Tachycardia present.     Heart sounds: Normal heart sounds.  Pulmonary:     Effort: Pulmonary effort is normal. No respiratory distress.     Breath sounds: Normal breath sounds. No stridor. No wheezing, rhonchi or rales.     Comments: Harsh bs at bases No wheeze even on forced exp Not shortness of breath at rest or exertion   Some upper airway sounds cleared by cough Chest:     Chest wall: No tenderness.  Musculoskeletal:     Cervical back: Neck supple.  Lymphadenopathy:     Cervical: No cervical adenopathy.  Skin:    Coloration: Skin is not pale.     Findings: No rash.  Neurological:     Mental Status: He is alert.  Psychiatric:        Mood and Affect: Mood normal.           Assessment & Plan:   Problem List Items Addressed This Visit       Other   Cough with fever - Primary   In pt with pna in June  Reviewed hospital notes, pcp notes and last xray as well as pmhx  Today cxr is  reassuring with some changes of bronchitis Also reassuring exam Neg flu and covid tests   Will treat with low dose prednisone  (pt reports wheezing at home) Prometh -dm with caution of sedation Symptom care-discussed   Update if not starting to improve in a week or if worsening  Call back and Er precautions noted in detail today        Relevant Orders   DG Chest 2 View (Completed)   POC Influenza A&B(BINAX/QUICKVUE) (Completed)   Other Visit Diagnoses       Acute cough       Relevant Orders   POC COVID-19 (Completed)

## 2024-02-20 NOTE — Telephone Encounter (Signed)
 Copied from CRM [id number]. Topic: Clinical - Red Word Triage >> Feb 20, 2024  2:08 PM Pinkey ORN wrote: Red Word that prompted transfer to Nurse Triage: Shortness Of Breath >> Feb 20, 2024  2:10 PM Pinkey ORN wrote: Patient recently had pneumonia. Since yesterday patient has been short of breath, coughing as well as experiencing some chills.  Reason for Disposition  [1] MILD difficulty breathing (e.g., minimal/no SOB at rest, SOB with walking, pulse < 100) AND [2] NEW-onset or WORSE than normal  Answer Assessment - Initial Assessment Questions 1. RESPIRATORY STATUS: Describe your breathing? (e.g., wheezing, shortness of breath, unable to speak, severe coughing)      Rock calling in, wife Pt with her. He is having shortness of breath, coughing and chills and not eating.   He had pneumonia a month ago.   It's trying to come back.   2. ONSET: When did this breathing problem begin?      Coughing started yesterday.   Very tired too.    Today the coughing and other symptoms started.    3. PATTERN Does the difficult breathing come and go, or has it been constant since it started?      Constant shortness of breath 4. SEVERITY: How bad is your breathing? (e.g., mild, moderate, severe)      Very short of breath 5. RECURRENT SYMPTOM: Have you had difficulty breathing before? If Yes, ask: When was the last time? and What happened that time?      Yes  He had pneumonia a month ago 6. CARDIAC HISTORY: Do you have any history of heart disease? (e.g., heart attack, angina, bypass surgery, angioplasty)      Not asked 7. LUNG HISTORY: Do you have any history of lung disease?  (e.g., pulmonary embolus, asthma, emphysema)     pneumonia a month ago 8. CAUSE: What do you think is causing the breathing problem?      Pneumonia coming back 9. OTHER SYMPTOMS: Do you have any other symptoms? (e.g., chest pain, cough, dizziness, fever, runny nose)     Coughing, chills, not eating anything and  very tired. 10. O2 SATURATION MONITOR:  Do you use an oxygen saturation monitor (pulse oximeter) at home? If Yes, ask: What is your reading (oxygen level) today? What is your usual oxygen saturation reading? (e.g., 95%)       N/A 11. PREGNANCY: Is there any chance you are pregnant? When was your last menstrual period?       N/A 12. TRAVEL: Have you traveled out of the country in the last month? (e.g., travel history, exposures)       N/A  Protocols used: Breathing Difficulty-A-AH FYI Only or Action Required?: FYI only for provider.  Patient was last seen in primary care on 02/10/2024 by Avelina Greig BRAVO, MD.  Called Nurse Triage reporting Shortness of Breath.  Symptoms began several days ago. Having shortness of breath, chills, tired, not eating, and coughing.   Had pneumonia a month ago.  Thinks it's coming back.  Interventions attempted: Rest, hydration, or home remedies.  Symptoms are: rapidly worsening.  Triage Disposition: See HCP Within 4 Hours (Or PCP Triage)  Patient/caregiver understands and will follow disposition?: Yes

## 2024-02-20 NOTE — Telephone Encounter (Signed)
 Seen in office.

## 2024-02-20 NOTE — Patient Instructions (Signed)
 Chest xray now  We will reach out with result and plan Negative covid test

## 2024-02-22 NOTE — Assessment & Plan Note (Addendum)
 In pt with pna in June  Reviewed hospital notes, pcp notes and last xray as well as pmhx  Today cxr is reassuring with some changes of bronchitis Also reassuring exam Neg flu and covid tests   Will treat with low dose prednisone  (pt reports wheezing at home) Prometh -dm with caution of sedation Symptom care-discussed   Update if not starting to improve in a week or if worsening  Call back and Er precautions noted in detail today

## 2024-03-04 ENCOUNTER — Ambulatory Visit: Payer: Self-pay

## 2024-03-04 ENCOUNTER — Ambulatory Visit (INDEPENDENT_AMBULATORY_CARE_PROVIDER_SITE_OTHER): Payer: Medicare (Managed Care)

## 2024-03-04 VITALS — BP 128/78 | Ht 65.75 in | Wt 195.0 lb

## 2024-03-04 DIAGNOSIS — Z0000 Encounter for general adult medical examination without abnormal findings: Secondary | ICD-10-CM

## 2024-03-04 NOTE — Telephone Encounter (Signed)
 Appointment with Dr. Avelina 03/05/2024 at 10:40 am

## 2024-03-04 NOTE — Patient Instructions (Signed)
 Chase Moreno , Thank you for taking time out of your busy schedule to complete your Annual Wellness Visit with me. I enjoyed our conversation and look forward to speaking with you again next year. I, as well as your care team,  appreciate your ongoing commitment to your health goals. Please review the following plan we discussed and let me know if I can assist you in the future. Your Game plan/ To Do List    Referrals: If you haven't heard from the office you've been referred to, please reach out to them at the phone provided.   Follow up Visits: We will see or speak with you next year for your Next Medicare AWV with our clinical staff Have you seen your provider in the last 6 months (3 months if uncontrolled diabetes)? Yes  Clinician Recommendations:  Aim for 30 minutes of exercise or brisk walking, 6-8 glasses of water, and 5 servings of fruits and vegetables each day.       This is a list of the screenings recommended for you:  Health Maintenance  Topic Date Due   COVID-19 Vaccine (3 - Moderna risk series) 09/17/2019   Eye exam for diabetics  03/31/2022   Flu Shot  10/12/2024*   Hemoglobin A1C  08/05/2024   Yearly kidney function blood test for diabetes  02/02/2025   Yearly kidney health urinalysis for diabetes  02/02/2025   Complete foot exam   02/09/2025   Medicare Annual Wellness Visit  03/04/2025   Cologuard (Stool DNA test)  02/19/2026   DTaP/Tdap/Td vaccine (3 - Td or Tdap) 02/10/2034   Pneumococcal Vaccine for age over 82  Completed   Hepatitis C Screening  Completed   Zoster (Shingles) Vaccine  Completed   HPV Vaccine  Aged Out   Meningitis B Vaccine  Aged Out   Colon Cancer Screening  Discontinued  *Topic was postponed. The date shown is not the original due date.    Advanced directives: (Declined) Advance directive discussed with you today. Even though you declined this today, please call our office should you change your mind, and we can give you the proper paperwork  for you to fill out. Advance Care Planning is important because it:  [x]  Makes sure you receive the medical care that is consistent with your values, goals, and preferences  [x]  It provides guidance to your family and loved ones and reduces their decisional burden about whether or not they are making the right decisions based on your wishes.  Follow the link provided in your after visit summary or read over the paperwork we have mailed to you to help you started getting your Advance Directives in place. If you need assistance in completing these, please reach out to us  so that we can help you!  See attachments for Preventive Care and Fall Prevention Tips.

## 2024-03-04 NOTE — Progress Notes (Signed)
 Because this visit was a virtual/telehealth visit,  certain criteria was not obtained, such a blood pressure, CBG if applicable, and timed get up and go. Any medications not marked as taking were not mentioned during the medication reconciliation part of the visit. Any vitals not documented were not able to be obtained due to this being a telehealth visit or patient was unable to self-report a recent blood pressure reading due to a lack of equipment at home via telehealth. Vitals that have been documented are verbally provided by the patient.   This visit was performed by a medical professional under my direct supervision. I was immediately available for consultation/collaboration. I have reviewed and agree with the Annual Wellness Visit documentation.  Subjective:   Chase Moreno is a 73 y.o. who presents for a Medicare Wellness preventive visit.  As a reminder, Annual Wellness Visits don't include a physical exam, and some assessments may be limited, especially if this visit is performed virtually. We may recommend an in-person follow-up visit with your provider if needed.  Visit Complete: Virtual I connected with  Chase Moreno on 03/04/24 by a audio enabled telemedicine application and verified that I am speaking with the correct person using two identifiers.  Patient Location: Home  Provider Location: Home Office  I discussed the limitations of evaluation and management by telemedicine. The patient expressed understanding and agreed to proceed.  Vital Signs: Because this visit was a virtual/telehealth visit, some criteria may be missing or patient reported. Any vitals not documented were not able to be obtained and vitals that have been documented are patient reported.  VideoDeclined- This patient declined Librarian, academic. Therefore the visit was completed with audio only.  Persons Participating in Visit: Patient.  AWV Questionnaire: No: Patient Medicare  AWV questionnaire was not completed prior to this visit.  Cardiac Risk Factors include: advanced age (>32men, >61 women);male gender;obesity (BMI >30kg/m2);diabetes mellitus;dyslipidemia;hypertension     Objective:    Today's Vitals   03/04/24 0947  BP: 128/78  Weight: 195 lb (88.5 kg)  Height: 5' 5.75 (1.67 m)   Body mass index is 31.71 kg/m.     03/04/2024    9:52 AM 12/21/2023    8:58 PM 12/21/2023    4:24 PM 01/01/2023    9:03 AM 11/26/2021    3:18 PM 06/08/2019    9:27 AM 05/29/2018    8:19 AM  Advanced Directives  Does Patient Have a Medical Advance Directive? No  No No No Yes Yes   Type of Careers adviser;Living will Living will  Copy of Healthcare Power of Attorney in Chart?      No - copy requested   Would patient like information on creating a medical advance directive? No - Patient declined No - Patient declined  No - Patient declined        Data saved with a previous flowsheet row definition    Current Medications (verified) Outpatient Encounter Medications as of 03/04/2024  Medication Sig   aspirin  EC 81 MG tablet Take 81 mg by mouth daily. Swallow whole.   atorvastatin  (LIPITOR) 10 MG tablet Take 1 tablet (10 mg total) by mouth daily.   losartan  (COZAAR ) 50 MG tablet Take 1 tablet (50 mg total) by mouth daily.   promethazine -dextromethorphan  (PROMETHAZINE -DM) 6.25-15 MG/5ML syrup Take 5 mLs by mouth 3 (three) times daily as needed for cough. Caution of sedation   Tdap (BOOSTRIX ) 5-2.5-18.5 LF-MCG/0.5 injection Inject into the  muscle.   predniSONE  (DELTASONE ) 20 MG tablet Take 1 tablet (20 mg total) by mouth daily with breakfast. (Patient not taking: Reported on 03/04/2024)   No facility-administered encounter medications on file as of 03/04/2024.    Allergies (verified) Patient has no known allergies.   History: Past Medical History:  Diagnosis Date   Allergic rhinitis, cause unspecified    Cancer (HCC)    prostate cancer  2018   Diabetes mellitus without complication (HCC)    patient denies although dx in 2017 with a Dr Avelina ; last hgA1c was however 5.5    Unspecified essential hypertension    Unspecified sleep apnea    CPAP machine use    Past Surgical History:  Procedure Laterality Date   FRACTURE SURGERY     left arm    LYMPHADENECTOMY Bilateral 02/17/2017   Procedure: LYMPHADENECTOMY;  Surgeon: Renda Glance, MD;  Location: [facility];  Service: Urology;  Laterality: Bilateral;   ROBOT ASSISTED LAPAROSCOPIC RADICAL PROSTATECTOMY N/A 02/17/2017   Procedure: XI ROBOTIC ASSISTED LAPAROSCOPIC RADICAL PROSTATECTOMY LEVEL 2;  Surgeon: Renda Glance, MD;  Location: [facility];  Service: Urology;  Laterality: N/A;   TONSILLECTOMY     Family History  Problem Relation Age of Onset   Arthritis Mother    Arrhythmia Mother    Stroke Paternal Grandmother    Social History   Socioeconomic History   Marital status: Married    Spouse name: Not on file   Number of children: 2   Years of education: Not on file   Highest education level: Not on file  Occupational History   Occupation: machine shop    Comment: [employer]  Tobacco Use   Smoking status: Former   Smokeless tobacco: Never   Tobacco comments:    quit over 40 years ago  Psychologist, educational Use   Vaping status: Never Used  Substance and Sexual Activity   Alcohol use: No    Alcohol/week: 0.0 standard drinks of alcohol   Drug use: No   Sexual activity: Not on file  Other Topics Concern   Not on file  Social History Narrative   Regular exercise---yes, rabbit hunting, walking 2 times  A week.      Diet---eating fruit and veggies, limiting meat.      Widowed: Remarried.         Social Drivers of Corporate investment banker Strain: Low Risk  (03/04/2024)   Overall Financial Resource Strain (CARDIA)    Difficulty of Paying Living Expenses: Not hard at all  Food Insecurity: No Food Insecurity (03/04/2024)   Hunger Vital Sign    Worried About  Running Out of Food in the Last Year: Never true    Ran Out of Food in the Last Year: Never true  Transportation Needs: No Transportation Needs (01/26/2024)   PRAPARE - Administrator, Civil Service (Medical): No    Lack of Transportation (Non-Medical): No  Physical Activity: Sufficiently Active (03/04/2024)   Exercise Vital Sign    Days of Exercise per Week: 5 days    Minutes of Exercise per Session: 30 min  Stress: No Stress Concern Present (03/04/2024)   [employer] of Occupational Health - Occupational Stress Questionnaire    Feeling of Stress: Not at all  Social Connections: Moderately Integrated (03/04/2024)   Social Connection and Isolation Panel    Frequency of Communication with Friends and Family: More than three times a week    Frequency of Social Gatherings with Friends and Family:  More than three times a week    Attends Religious Services: More than 4 times per year    Active Member of Clubs or Organizations: No    Attends Banker Meetings: Never    Marital Status: Married    Tobacco Counseling Counseling given: Not Answered Tobacco comments: quit over 40 years ago    Clinical Intake:  Pre-visit preparation completed: Yes  Pain : No/denies pain     BMI - recorded: 31.71 Nutritional Status: BMI > 30  Obese Nutritional Risks: None Diabetes: No  Lab Results  Component Value Date   HGBA1C 6.5 02/03/2024   HGBA1C 6.2 02/04/2023   HGBA1C 5.6 04/19/2022     How often do you need to have someone help you when you read instructions, pamphlets, or other written materials from your doctor or pharmacy?: 1 - Never  Interpreter Needed?: No  Information entered by :: Janeli Lewison,cma   Activities of Daily Living     03/04/2024    9:51 AM 12/21/2023    8:52 PM  In your present state of health, do you have any difficulty performing the following activities:  Hearing? 0 1  Vision? 0 0  Difficulty concentrating or making decisions?  0 0  Walking or climbing stairs? 0   Dressing or bathing? 0   Doing errands, shopping? 0 0  Preparing Food and eating ? N   Using the Toilet? N   In the past six months, have you accidently leaked urine? N   Do you have problems with loss of bowel control? N   Managing your Medications? N   Managing your Finances? N   Housekeeping or managing your Housekeeping? N     Patient Care Team: Avelina Greig BRAVO, MD as PCP - Diedre Renda Glance, MD as Consulting Physician (Urology) Nieves Cough, MD as Consulting Physician (Urology) Parrett, Madelin RAMAN, NP as Nurse Practitioner (Pulmonary Disease)  I have updated your Care Teams any recent Medical Services you may have received from other providers in the past year.     Assessment:   This is a routine wellness examination for Chase Moreno.  Hearing/Vision screen Hearing Screening - Comments:: No difficulties Vision Screening - Comments:: Patient has no difficulties    Goals Addressed             This Visit's Progress    Patient Stated   On track    06/08/2019, I will maintain and continue medications as prescribed.        Depression Screen     03/04/2024    9:53 AM 02/10/2024   10:17 AM 01/01/2024   11:19 AM 12/25/2023   10:40 AM 01/01/2023    9:01 AM 08/08/2022   10:46 AM 11/26/2021    3:19 PM  PHQ 2/9 Scores  PHQ - 2 Score 0 0 0 3 0 0 0  PHQ- 9 Score 1   9       Fall Risk     03/04/2024    9:52 AM 02/10/2024   10:17 AM 01/01/2024   11:19 AM 12/25/2023   10:40 AM 01/01/2023    8:55 AM  Fall Risk   Falls in the past year? 0 0 0 0 0  Number falls in past yr: 0 0 0 0 0  Injury with Fall? 0 0 0 0 0  Risk for fall due to : No Fall Risks No Fall Risks   No Fall Risks  Follow up Falls evaluation completed Falls evaluation completed  Falls evaluation completed Falls prevention discussed;Falls evaluation completed    MEDICARE RISK AT HOME:  Medicare Risk at Home Any stairs in or around the home?: Yes If so, are there any  without handrails?: No Home free of loose throw rugs in walkways, pet beds, electrical cords, etc?: Yes Adequate lighting in your home to reduce risk of falls?: Yes Life alert?: No Use of a cane, walker or w/c?: No Grab bars in the bathroom?: Yes Shower chair or bench in shower?: Yes Elevated toilet seat or a handicapped toilet?: Yes  TIMED UP AND GO:  Was the test performed?  No  Cognitive Function: 6CIT completed    06/08/2019    9:39 AM 05/29/2018    8:15 AM 05/27/2017    9:44 AM  MMSE - Mini Mental State Exam  Orientation to time 5 5 5    Orientation to Place 5 5 5    Registration 3 3 3    Attention/ Calculation 5 0 0   Recall 3 3 3    Language- name 2 objects  0 0   Language- repeat 1 1 1   Language- follow 3 step command  3 3   Language- read & follow direction  0 0   Write a sentence  0 0   Copy design  0 0   Total score  20 20      Data saved with a previous flowsheet row definition        03/04/2024    9:49 AM 01/01/2023    9:06 AM 11/26/2021    3:20 PM  6CIT Screen  What Year? 0 points 0 points 0 points  What month? 0 points 0 points 0 points  What time? 0 points 0 points 3 points  Count back from 20 0 points 0 points 0 points  Months in reverse 0 points 0 points 0 points  Repeat phrase 0 points 0 points 4 points  Total Score 0 points 0 points 7 points    Immunizations Immunization History  Administered Date(s) Administered   Fluad Quad(high Dose 65+) 04/29/2019, 05/11/2021, 04/19/2022   Influenza Nasal 04/14/2013   Influenza,inj,Quad PF,6+ Mos 09/12/2015, 04/13/2018   Influenza,trivalent, recombinat, inj, PF 05/04/2017   Influenza-Unspecified 04/18/2014   Moderna Sars-Covid-2 Vaccination 07/23/2019, 08/20/2019   PNEUMOCOCCAL CONJUGATE-20 01/13/2023   Pneumococcal Conjugate-13 09/12/2015   Pneumococcal Polysaccharide-23 05/27/2017   Td 08/24/2007   Tdap 02/11/2024   Zoster Recombinant(Shingrix) 09/19/2021, 01/13/2023   Zoster, Live 11/09/2014     Screening Tests Health Maintenance  Topic Date Due   COVID-19 Vaccine (3 - Moderna risk series) 09/17/2019   OPHTHALMOLOGY EXAM  03/31/2022   INFLUENZA VACCINE  10/12/2024 (Originally 02/13/2024)   HEMOGLOBIN A1C  08/05/2024   Diabetic kidney evaluation - eGFR measurement  02/02/2025   Diabetic kidney evaluation - Urine ACR  02/02/2025   FOOT EXAM  02/09/2025   Medicare Annual Wellness (AWV)  03/04/2025   Fecal DNA (Cologuard)  02/19/2026   DTaP/Tdap/Td (3 - Td or Tdap) 02/10/2034   Pneumococcal Vaccine: 50+ Years  Completed   Hepatitis C Screening  Completed   Zoster Vaccines- Shingrix  Completed   HPV VACCINES  Aged Out   Meningococcal B Vaccine  Aged Out   Colonoscopy  Discontinued    Health Maintenance  Health Maintenance Due  Topic Date Due   COVID-19 Vaccine (3 - Moderna risk series) 09/17/2019   OPHTHALMOLOGY EXAM  03/31/2022   Health Maintenance Items Addressed:patient declined vaccination  Additional Screening:  Vision Screening: Recommended annual ophthalmology  exams for early detection of glaucoma and other disorders of the eye. Would you like a referral to an eye doctor? No    Dental Screening: Recommended annual dental exams for proper oral hygiene  Community Resource Referral / Chronic Care Management: CRR required this visit?  No   CCM required this visit?  No   Plan:    I have personally reviewed and noted the following in the patient's chart:   Medical and social history Use of alcohol, tobacco or illicit drugs  Current medications and supplements including opioid prescriptions. Patient is not currently taking opioid prescriptions. Functional ability and status Nutritional status Physical activity Advanced directives List of other physicians Hospitalizations, surgeries, and ER visits in previous 12 months Vitals Screenings to include cognitive, depression, and falls Referrals and appointments  In addition, I have reviewed and discussed  with patient certain preventive protocols, quality metrics, and best practice recommendations. A written personalized care plan for preventive services as well as general preventive health recommendations were provided to patient.   Chase Moreno, [place]   03/04/2024   After Visit Summary: (MyChart) Due to this being a telephonic visit, the after visit summary with patients personalized plan was offered to patient via MyChart   Notes: Nothing significant to report at this time.

## 2024-03-04 NOTE — Telephone Encounter (Signed)
 Copied from CRM #[id number]. Topic: Clinical - Red Word Triage >> Mar 04, 2024  4:02 PM Thersia BROCKS wrote: Red Word that prompted transfer to Nurse Triage: Patient wife, Rock called in stated patient has been vomiting a lot . did have loose stool a couple of days ago, and is very weak Reason for Disposition  [1] MILD or MODERATE vomiting AND [2] present > 48 hours (2 days)  (Exception: Mild vomiting with associated diarrhea.)  Answer Assessment - Initial Assessment Questions Symptoms started with diarrhea on Sunday night. Pt states his vomit looks like the food he ate and things he was able to drink. Pt's wife reports that food stays on his stomach for 4-5 hours then he vomits. Denies any abd swelling or green colored vomit.     1. VOMITING SEVERITY: How many times have you vomited in the past 24 hours?      Three times today, twice last night.  2. ONSET: When did the vomiting begin?      Yesterday  3. FLUIDS: What fluids or food have you vomited up today? Have you been able to keep any fluids down?     Yes can keep them down for awhile but then they come up later  4. ABDOMEN PAIN: Are your having any abdomen pain? If Yes : How bad is it and what does it feel like? (e.g., crampy, dull, intermittent, constant)      No 5. DIARRHEA: Is there any diarrhea? If Yes, ask: How many times today?      Yes, 3 loose stools today  6. CONTACTS: Is there anyone else in the family with the same symptoms?      No 7. HYDRATION STATUS: Any signs of dehydration? (e.g., dry mouth [not only dry lips], too weak to stand) When did you last urinate?     Denies issues with hydration  9. OTHER SYMPTOMS: Do you have any other symptoms? (e.g., fever, headache, vertigo, vomiting blood or coffee grounds, recent head injury)     Some weakness  Protocols used: Vomiting-A-AH

## 2024-03-05 ENCOUNTER — Ambulatory Visit: Payer: Medicare (Managed Care) | Admitting: Family Medicine

## 2024-03-05 ENCOUNTER — Ambulatory Visit: Payer: Self-pay | Admitting: Family Medicine

## 2024-03-05 ENCOUNTER — Encounter: Payer: Self-pay | Admitting: Family Medicine

## 2024-03-05 VITALS — BP 128/66 | HR 90 | Temp 97.3°F | Ht 65.75 in | Wt 185.0 lb

## 2024-03-05 DIAGNOSIS — R112 Nausea with vomiting, unspecified: Secondary | ICD-10-CM | POA: Diagnosis not present

## 2024-03-05 LAB — COMPREHENSIVE METABOLIC PANEL WITH GFR
ALT: 23 U/L (ref 0–53)
AST: 22 U/L (ref 0–37)
Albumin: 3.8 g/dL (ref 3.5–5.2)
Alkaline Phosphatase: 79 U/L (ref 39–117)
BUN: 14 mg/dL (ref 6–23)
CO2: 27 meq/L (ref 19–32)
Calcium: 9.1 mg/dL (ref 8.4–10.5)
Chloride: 106 meq/L (ref 96–112)
Creatinine, Ser: 1.12 mg/dL (ref 0.40–1.50)
GFR: 64.93 mL/min (ref >60.00)
Glucose, Bld: 97 mg/dL (ref 70–99)
Potassium: 4.1 meq/L (ref 3.5–5.1)
Sodium: 141 meq/L (ref 135–145)
Total Bilirubin: 0.4 mg/dL (ref 0.2–1.2)
Total Protein: 8.2 g/dL (ref 6.0–8.3)

## 2024-03-05 LAB — CBC WITH DIFFERENTIAL/PLATELET
Basophils Absolute: 0 K/uL (ref 0.0–0.1)
Basophils Relative: 0.3 % (ref 0.0–3.0)
Eosinophils Absolute: 0.1 K/uL (ref 0.0–0.7)
Eosinophils Relative: 0.8 % (ref 0.0–5.0)
HCT: 38.7 % — ABNORMAL LOW (ref 39.0–52.0)
Hemoglobin: 12.6 g/dL — ABNORMAL LOW (ref 13.0–17.0)
Lymphocytes Relative: 9.8 % — ABNORMAL LOW (ref 12.0–46.0)
Lymphs Abs: 1 K/uL (ref 0.7–4.0)
MCHC: 32.4 g/dL (ref 30.0–36.0)
MCV: 83.3 fl (ref 78.0–100.0)
Monocytes Absolute: 0.8 K/uL (ref 0.1–1.0)
Monocytes Relative: 8 % (ref 3.0–12.0)
Neutro Abs: 8.5 K/uL — ABNORMAL HIGH (ref 1.4–7.7)
Neutrophils Relative %: 81.1 % — ABNORMAL HIGH (ref 43.0–77.0)
Platelets: 345 K/uL (ref 150.0–400.0)
RBC: 4.65 Mil/uL (ref 4.22–5.81)
RDW: 17.9 % — ABNORMAL HIGH (ref 11.5–15.5)
WBC: 10.4 K/uL (ref 4.0–10.5)

## 2024-03-05 LAB — LIPASE: Lipase: 25 U/L (ref 11.0–59.0)

## 2024-03-05 NOTE — Patient Instructions (Addendum)
 Stop  vitamin C. Continue other medications.   Please stop at the lab to have labs drawn.  Push fluids, bland diet.  Call if symptoms change such as pain or fever.  Go to ER for severe pain.

## 2024-03-05 NOTE — Progress Notes (Signed)
 Patient ID: Chase Moreno, male    DOB: 05/11/1951, 73 y.o.   MRN: [id number]  This visit was conducted in person.  BP 128/66   Pulse 90   Temp (!) 97.3 F (36.3 C) (Temporal)   Ht 5' 5.75 (1.67 m)   Wt 185 lb (83.9 kg)   SpO2 96%   BMI 30.09 kg/m    CC:  Chief Complaint  Patient presents with   Emesis    Vomiting for 3-4 days. Worse after eating or drinking     Subjective:   HPI: Chase Moreno is a 73 y.o. male presenting on 03/05/2024 for Emesis (Vomiting for 3-4 days. Worse after eating or drinking )  New onset  diarrhea  every 2 hours 5 days ago... last 24 hours.  Started after going to a family reunion... had tea and brownie.  No blood in stool.  Then progressed to  nausea and emesis couple hours  after eating.  Has occurred yesterday afternoon and again this AM   Woke up this AM until he ate cereal this AM.  No abdominal pain.  Throws up then feels better.  He feels well overall.  No fever.   No dysuria, no urine flow issues.   BM now back to normal 2 times a day, normal consistency.   No sick contacts.  Has started vit C w 2 weeks ago. No heart burn   He was seen on February 20, 2024 by Dr. Randeen for shortness of breath, cough and decreased appetite. COVID and flu test were negative Chest x-ray showed bronchitis changes. He was treated with prednisone  taper and cough suppressant.  Symptoms resolved.   No current cough, no SOB.    No past abdominal surgeries except past prostate surgery.    Relevant past medical, surgical, family and social history reviewed and updated as indicated. Interim medical history since our last visit reviewed. Allergies and medications reviewed and updated. Outpatient Medications Prior to Visit  Medication Sig Dispense Refill   aspirin  EC 81 MG tablet Take 81 mg by mouth daily. Swallow whole.     atorvastatin  (LIPITOR) 10 MG tablet Take 1 tablet (10 mg total) by mouth daily. 90 tablet 3   losartan  (COZAAR ) 50 MG tablet Take 1  tablet (50 mg total) by mouth daily. 90 tablet 3   predniSONE  (DELTASONE ) 20 MG tablet Take 1 tablet (20 mg total) by mouth daily with breakfast. (Patient not taking: Reported on 03/05/2024) 5 tablet 0   promethazine -dextromethorphan  (PROMETHAZINE -DM) 6.25-15 MG/5ML syrup Take 5 mLs by mouth 3 (three) times daily as needed for cough. Caution of sedation (Patient not taking: Reported on 03/05/2024) 118 mL 0   Tdap (BOOSTRIX ) 5-2.5-18.5 LF-MCG/0.5 injection Inject into the muscle. (Patient not taking: Reported on 03/05/2024) 0.5 mL 0   No facility-administered medications prior to visit.     Per HPI unless specifically indicated in ROS section below Review of Systems  Constitutional:  Negative for fatigue and fever.  HENT:  Negative for ear pain.   Eyes:  Negative for pain.  Respiratory:  Negative for cough and shortness of breath.   Cardiovascular:  Negative for chest pain, palpitations and leg swelling.  Gastrointestinal:  Positive for diarrhea, nausea and vomiting. Negative for abdominal pain.  Genitourinary:  Negative for dysuria.  Musculoskeletal:  Negative for arthralgias.  Neurological:  Negative for syncope, light-headedness and headaches.  Psychiatric/Behavioral:  Negative for dysphoric mood.    Objective:  BP 128/66   Pulse 90  Temp (!) 97.3 F (36.3 C) (Temporal)   Ht 5' 5.75 (1.67 m)   Wt 185 lb (83.9 kg)   SpO2 96%   BMI 30.09 kg/m   Wt Readings from Last 3 Encounters:  03/05/24 185 lb (83.9 kg)  03/04/24 195 lb (88.5 kg)  02/20/24 193 lb 4 oz (87.7 kg)      Physical Exam Vitals reviewed.  Constitutional:      Appearance: He is well-developed.  HENT:     Head: Normocephalic.     Right Ear: Hearing normal.     Left Ear: Hearing normal.     Nose: Nose normal.  Neck:     Thyroid: No thyroid mass or thyromegaly.     Vascular: No carotid bruit.     Trachea: Trachea normal.  Cardiovascular:     Rate and Rhythm: Normal rate and regular rhythm.     Pulses: Normal  pulses.     Heart sounds: Heart sounds not distant. No murmur heard.    No friction rub. No gallop.     Comments: No peripheral edema Pulmonary:     Effort: Pulmonary effort is normal. No respiratory distress.     Breath sounds: Normal breath sounds.  Abdominal:     General: Abdomen is protuberant. Bowel sounds are normal.     Palpations: Abdomen is soft.     Tenderness: There is no abdominal tenderness. There is no right CVA tenderness, left CVA tenderness, guarding or rebound.     Hernia: No hernia is present.  Skin:    General: Skin is warm and dry.     Findings: No rash.  Psychiatric:        Speech: Speech normal.        Behavior: Behavior normal.        Thought Content: Thought content normal.       Results for orders placed or performed in visit on 02/20/24  POC COVID-19   Collection Time: 02/20/24  3:42 PM  Result Value Ref Range   SARS Coronavirus 2 Ag Negative Negative  POC Influenza A&B(BINAX/QUICKVUE)   Collection Time: 02/20/24  4:04 PM  Result Value Ref Range   Influenza A, POC Negative Negative   Influenza B, POC Negative Negative    Assessment and Plan  Nausea and vomiting, unspecified vomiting type Assessment & Plan:  Acute, following 1 episode earlier of diarrhea.  Did start having symptoms after her reunion, question viral infection contact.  Did not eat much at reunion. No abdominal pain.  No red flags.  Will evaluate with labs to evaluate for  pancreatitis, new liver issue or infection.  Most likely viral gastroenteritis although unusual that patient feels well otherwise.  Encouraged him to stop vitamin C just in case this is irritating his stomach.  Recommended bland diet but pushing fluids to stay hydrated.  Return and ER precautions provided.  Orders: -     Comprehensive metabolic panel with GFR -     CBC with Differential/Platelet -     Lipase    No follow-ups on file.   Greig Ring, MD

## 2024-03-05 NOTE — Assessment & Plan Note (Signed)
 Acute, following 1 episode earlier of diarrhea.  Did start having symptoms after her reunion, question viral infection contact.  Did not eat much at reunion. No abdominal pain.  No red flags.  Will evaluate with labs to evaluate for  pancreatitis, new liver issue or infection.  Most likely viral gastroenteritis although unusual that patient feels well otherwise.  Encouraged him to stop vitamin C just in case this is irritating his stomach.  Recommended bland diet but pushing fluids to stay hydrated.  Return and ER precautions provided.

## 2024-03-18 ENCOUNTER — Other Ambulatory Visit: Payer: Self-pay | Admitting: Family Medicine

## 2024-03-18 NOTE — Telephone Encounter (Signed)
 Last office visit 03/05/2024 for emesis.  Last refilled 02/20/24 for 118 ml with no refills.  Next appt: No future appointments with PCP. SABRA

## 2024-03-29 ENCOUNTER — Ambulatory Visit: Payer: Self-pay

## 2024-03-29 DIAGNOSIS — S060X0A Concussion without loss of consciousness, initial encounter: Secondary | ICD-10-CM | POA: Diagnosis not present

## 2024-03-29 DIAGNOSIS — R590 Localized enlarged lymph nodes: Secondary | ICD-10-CM | POA: Diagnosis not present

## 2024-03-29 DIAGNOSIS — R053 Chronic cough: Secondary | ICD-10-CM | POA: Diagnosis not present

## 2024-03-29 DIAGNOSIS — R918 Other nonspecific abnormal finding of lung field: Secondary | ICD-10-CM | POA: Diagnosis not present

## 2024-03-29 DIAGNOSIS — D72829 Elevated white blood cell count, unspecified: Secondary | ICD-10-CM | POA: Diagnosis not present

## 2024-03-29 DIAGNOSIS — I700 Atherosclerosis of aorta: Secondary | ICD-10-CM | POA: Diagnosis not present

## 2024-03-29 DIAGNOSIS — R55 Syncope and collapse: Secondary | ICD-10-CM | POA: Diagnosis not present

## 2024-03-29 NOTE — Telephone Encounter (Signed)
 FYI Only or Action Required?: FYI only for provider.  Patient was last seen in primary care on 03/05/2024 by Avelina Greig BRAVO, MD.  Called Nurse Triage reporting Loss of Consciousness.  Symptoms began today.  Interventions attempted: Nothing.  Symptoms are: unchanged.  Triage Disposition: Go to ED Now (Notify PCP)  Patient/caregiver understands and will follow disposition?: YesCopied from CRM #[id number]. Topic: Clinical - Red Word Triage >> Mar 29, 2024 11:10 AM Larissa RAMAN wrote: [facility] that prompted transfer to Nurse Triage: syncope Reason for Disposition  [1] Fainted > 15 minutes ago AND [2] still feels weak or dizzy  Answer Assessment - Initial Assessment Questions Pt has several fainting spells since last night. He coughed, fell, hit head against tree a few minutes ago.. RN advised ED. Pt came on phone when I cough, I get lightheaded, daze like  and I pass out.    Wife wants to take pt to [facility] and needs referral    1. ONSET: How long were you unconscious? (e.g., minutes, seconds) When did it happen?     Seconds to minute  2. CONTENT: What happened during the period of unconsciousness? (e.g., seizure activity)      Fell to ground 3. MENTAL STATUS: Alert and oriented now? (e.g., oriented x 3 = name, month, location)      yes 4. TRIGGER: What do you think caused the fainting? What were you doing just before you fainted?  (e.g., exercise, sudden standing up, prolonged standing)     Coughing  5. RECURRENT SYMPTOM: Have you ever passed out before? If Yes, ask: When was the last time? and What happened that time?      yes 6. INJURY: Did you hurt yourself when you fell?      Wife denies 7. CARDIAC SYMPTOMS: Have you had any of the following symptoms: chest pain, difficulty breathing, palpitations?    SOB yesterday  8. NEUROLOGIC SYMPTOMS: Have you had any of the following symptoms: headache, numbness, vertigo, weakness?    Weak   when coughing  9. GI  SYMPTOMS: Have you had any of the following symptoms: abdomen pain, vomiting, diarrhea, blood in stools?     denies 10. OTHER SYMPTOMS: Do you have any other symptoms?       Lightheaded, fatigue  Protocols used: Fainting-A-AH

## 2024-03-29 NOTE — Telephone Encounter (Signed)
 FYI Only or Action Required?: FYI only for provider.  Patient was last seen in primary care on 03/05/2024 by Avelina Greig BRAVO, MD.  Called Nurse Triage reporting Cough.  Symptoms began several months ago.  Interventions attempted: OTC medications: Cough meds.  Symptoms are: gradually worsening.  Triage Disposition: See Physician Within 24 Hours  Patient/caregiver understands and will follow disposition?: Yes    Copied from CRM #[id number]. Topic: Clinical - Red Word Triage >> Mar 29, 2024  9:30 AM Winona R wrote: [employer] denial. Bad cough started make him light headed. Pt previously had pneumonia two months ago and stated his cough has been getting worst since then. Reason for Disposition  SEVERE coughing spells (e.g., whooping sound after coughing, vomiting after coughing)  Answer Assessment - Initial Assessment Questions Patient states he had walking pneumonia 2 months ago. Patient took prescription cough meds for symptoms. Symptoms are still lingering but cough is getting more severe.   1. ONSET: When did the cough begin?      2 months ago  2. SEVERITY: How bad is the cough today?      Symptoms are severe in the evening mild to moderate during the day.   3. SPUTUM: Describe the color of your sputum (e.g., none, dry cough; clear, white, yellow, green)     Clear sputum  4. HEMOPTYSIS: Are you coughing up any blood? If Yes, ask: How much? (e.g., flecks, streaks, tablespoons, etc.)     Denies 5. DIFFICULTY BREATHING: Are you having difficulty breathing? If Yes, ask: How bad is it? (e.g., mild, moderate, severe)      Denies  6. FEVER: Do you have a fever? If Yes, ask: What is your temperature, how was it measured, and when did it start?     Slight fever  7. CARDIAC HISTORY: Do you have any history of heart disease? (e.g., heart attack, congestive heart failure)      Denies  8. LUNG HISTORY: Do you have any history of lung disease?  (e.g., pulmonary  embolus, asthma, emphysema)     Denied  9. PE RISK FACTORS: Do you have a history of blood clots? (or: recent major surgery, recent prolonged travel, bedridden)     Denies  10. OTHER SYMPTOMS: Do you have any other symptoms? (e.g., runny nose, wheezing, chest pain)       Dizziness  Protocols used: Cough - Acute Productive-A-AH

## 2024-03-30 ENCOUNTER — Ambulatory Visit: Payer: Medicare (Managed Care) | Admitting: Family Medicine

## 2024-03-30 DIAGNOSIS — R55 Syncope and collapse: Secondary | ICD-10-CM | POA: Diagnosis not present

## 2024-03-30 DIAGNOSIS — R918 Other nonspecific abnormal finding of lung field: Secondary | ICD-10-CM | POA: Diagnosis not present

## 2024-03-30 DIAGNOSIS — R053 Chronic cough: Secondary | ICD-10-CM | POA: Diagnosis not present

## 2024-03-30 DIAGNOSIS — R590 Localized enlarged lymph nodes: Secondary | ICD-10-CM | POA: Diagnosis not present

## 2024-03-30 NOTE — ED Provider Notes (Signed)
 [facility] EMERGENCY DEPT  ED Provider Note History   Chief Complaint  Patient presents with  . Loss of Consciousness  . Cough    History of Present Illness Chase Moreno is a 73 year old male who presents with persistent cough and episodes of syncope.  He has been experiencing a persistent cough for several months. Initially, the cough responded to cough medicine but has since become ineffective. The cough is deep and productive of clear sputum. It is severe enough to cause lightheadedness and has led to two episodes of syncope, one occurring last night and another today.  He reports slight fevers at times, though they are low-grade. There is a decrease in appetite and energy levels, with energy quickly diminishing after waking. He has also experienced weight loss. No shortness of breath with walking and no recent illness exposure.  He has a history of sleep apnea and is supposed to use a CPAP machine at night, but he has not been using it for the past month and a half due to symptoms of coughing, dizziness, and low energy. A family member noted an episode where he was making strange noises and appeared disoriented.  He underwent a prior work-up including a chest x-ray, head scan, EKG, and blood work, but the results of these tests were not discussed in detail. HPI Level of Interpreter Services: No interpreter needed (no language barrier)  No past medical history on file. No past surgical history on file. No family history on file. Social History   Socioeconomic History  . Marital status: Married   Social Drivers of Corporate investment banker Strain: Low Risk  (03/04/2024)   Received from [facility]   Overall Financial Resource Strain (CARDIA)   . How hard is it for you to pay for the very basics like food, housing, medical care, and heating?: Not hard at all  Food Insecurity: No Food Insecurity (03/04/2024)   Received from [facility]   Hunger Vital Sign   . Within the past 12  months, you worried that your food would run out before you got the money to buy more.: Never true   . Within the past 12 months, the food you bought just didn't last and you didn't have money to get more.: Never true  Transportation Needs: No Transportation Needs (01/26/2024)   Received from [facility] - Transportation   . In the past 12 months, has lack of transportation kept you from medical appointments or from getting medications?: No   . In the past 12 months, has lack of transportation kept you from meetings, work, or from getting things needed for daily living?: No  Physical Activity: Sufficiently Active (03/04/2024)   Received from [facility] [place]   Exercise Vital Sign   . On average, how many days per week do you engage in moderate to strenuous exercise (like a brisk walk)?: 5 days   . On average, how many minutes do you engage in exercise at this level?: 30 min  Stress: No Stress Concern Present (03/04/2024)   Received from [facility] - Occupational Stress Questionnaire   . Do you feel stress - tense, restless, nervous, or anxious, or unable to sleep at night because your mind is troubled all the time - these days?: Not at all  Social Connections: Moderately Integrated (03/04/2024)   Received from [facility] [place]   Social Connection and Isolation Panel   . In a typical week,  how many times do you talk on the phone with family, friends, or neighbors?: More than three times a week   . How often do you get together with friends or relatives?: More than three times a week   . How often do you attend church or religious services?: More than 4 times per year   . Do you belong to any clubs or organizations such as church groups, unions, fraternal or athletic groups, or school groups?: No   . How often do you attend meetings of the clubs or organizations you belong to?: Never   . Are you married, widowed, divorced, separated, never married, or  living with a partner?: Married  Housing Stability: Unknown (03/30/2024)   Housing Stability Vital Sign   . Homeless in the Last Year: No   Review of Systems  Physical Exam  BP (!) 147/76 (BP Location: Right upper arm, Patient Position: Sitting)   Pulse 102   Temp 36.6 C (97.8 F) (Tympanic)   Resp 20   SpO2 96%  Physical Exam Physical Exam GENERAL: Alert, cooperative, well developed, no acute distress. HEENT: Normocephalic, normal oropharynx, moist mucous membranes. CHEST: Clear to auscultation bilaterally, no wheezes, rhonchi, or crackles. CARDIOVASCULAR: Normal heart rate and rhythm, S1 and S2 normal without murmurs. ABDOMEN: Soft, non-tender, non-distended, without organomegaly, normal bowel sounds. EXTREMITIES: No cyanosis or edema. NEUROLOGICAL: Cranial nerves grossly intact, moves all extremities without gross motor or sensory deficit.  Procedures  Procedures   Results LABS Blood work: Performed  RADIOLOGY Chest x-ray: Performed Head CT: Performed  DIAGNOSTIC STUDIES EKG: Performed Medical Decision Making   Medical Decision Making A 73 year old male with a history of obstructive sleep apnea (nonadherent to CPAP) presented with months of chronic cough productive of clear sputum, recent episodes of syncope associated with coughing fits, lightheadedness, decreased appetite, unintentional weight loss, and fatigue. He denied chest pain and significant shortness of breath.  Chronic cough with syncope and associated symptoms - Reviewed results of chest x-ray, head scan, EKG, and blood work - Obtained serial blood pressure readings  CBC shows leukocytosis to 19 with some neutrophil predominance.  Procalcitonin is undetectable.  Basic strep panel negative.  Mag normal.  CT read below Bilateral lower lobe predominant nodules in a perilymphatic distribution with prominent mediastinal and hilar lymphadenopathy. This constellation of findings may represent multifocal infection  or an inflammatory process such as sarcoidosis or pneumoconiosis. However, underlying malignancy cannot be excluded given the partially visualized retroperitoneal lymphadenopathy and the absence of prior exams for comparison. Consider follow-up CT chest to evaluate for stability or progression.  Patient has no occupational exposures to suggest pneumoconiosis.  Considered infectious etiology however procalcitonin within normal limits, patient afebrile.  The leukocytosis could be reactive from sarcoidosis versus other inflammatory process however it is unclear at this time.  Patient overall well-appearing and nontoxic.  I think it is reasonable that patient will need to follow-up primary care to follow-up for sarcoidosis versus other idiopathic pulmonary fibrosis.  I discussion with patient and wife.  Will need expedited follow-up with primary care physician which they say they are able to get.  Will also referral to pulmonology.  Spoke about return precautions.  Can be discharged home with expedited follow-up MDM        ED Clinical Impression  1. Syncope, unspecified syncope type   2. Concussion without loss of consciousness, initial encounter               This note has been created using  automated tools and reviewed for accuracy by REUBEN WILLIAM HORACE.   AI Note Feedback-- Point of Care Survey     Neomia Karena Fallow, [place] Resident 03/30/24 [phone]

## 2024-03-30 NOTE — Telephone Encounter (Signed)
 Patient seen at ED 9/15.

## 2024-03-30 NOTE — Telephone Encounter (Signed)
 Per chart review, patient at hospital [facility]

## 2024-04-09 ENCOUNTER — Emergency Department (HOSPITAL_COMMUNITY): Payer: Medicare (Managed Care)

## 2024-04-09 ENCOUNTER — Other Ambulatory Visit: Payer: Self-pay

## 2024-04-09 ENCOUNTER — Emergency Department (HOSPITAL_COMMUNITY)
Admission: EM | Admit: 2024-04-09 | Discharge: 2024-04-09 | Disposition: A | Discharge status: Home / Self Care | Payer: Medicare (Managed Care) | Attending: Emergency Medicine | Admitting: Emergency Medicine

## 2024-04-09 DIAGNOSIS — Z7982 Long term (current) use of aspirin: Secondary | ICD-10-CM | POA: Insufficient documentation

## 2024-04-09 DIAGNOSIS — R059 Cough, unspecified: Secondary | ICD-10-CM | POA: Insufficient documentation

## 2024-04-09 DIAGNOSIS — R0602 Shortness of breath: Secondary | ICD-10-CM | POA: Insufficient documentation

## 2024-04-09 DIAGNOSIS — R55 Syncope and collapse: Secondary | ICD-10-CM | POA: Diagnosis not present

## 2024-04-09 DIAGNOSIS — G4733 Obstructive sleep apnea (adult) (pediatric): Secondary | ICD-10-CM

## 2024-04-09 DIAGNOSIS — Z8546 Personal history of malignant neoplasm of prostate: Secondary | ICD-10-CM

## 2024-04-09 DIAGNOSIS — M722 Plantar fascial fibromatosis: Secondary | ICD-10-CM

## 2024-04-09 DIAGNOSIS — J40 Bronchitis, not specified as acute or chronic: Secondary | ICD-10-CM

## 2024-04-09 DIAGNOSIS — M7582 Other shoulder lesions, left shoulder: Secondary | ICD-10-CM

## 2024-04-09 DIAGNOSIS — R809 Proteinuria, unspecified: Secondary | ICD-10-CM

## 2024-04-09 DIAGNOSIS — E1169 Type 2 diabetes mellitus with other specified complication: Secondary | ICD-10-CM

## 2024-04-09 DIAGNOSIS — E1159 Type 2 diabetes mellitus with other circulatory complications: Secondary | ICD-10-CM

## 2024-04-09 DIAGNOSIS — R509 Fever, unspecified: Secondary | ICD-10-CM

## 2024-04-09 DIAGNOSIS — E6609 Other obesity due to excess calories: Secondary | ICD-10-CM

## 2024-04-09 DIAGNOSIS — H6121 Impacted cerumen, right ear: Secondary | ICD-10-CM

## 2024-04-09 DIAGNOSIS — C801 Malignant (primary) neoplasm, unspecified: Secondary | ICD-10-CM | POA: Insufficient documentation

## 2024-04-09 LAB — BASIC METABOLIC PANEL WITH GFR
Anion gap: 12 (ref 5–15)
BUN: 12 mg/dL (ref 8–23)
CO2: 26 mmol/L (ref 22–32)
Calcium: 9.5 mg/dL (ref 8.9–10.3)
Chloride: 103 mmol/L (ref 98–111)
Creatinine, Ser: 1.19 mg/dL (ref 0.61–1.24)
GFR, Estimated: >60 mL/min (ref >60)
Glucose, Bld: 96 mg/dL (ref 70–99)
Potassium: 4.2 mmol/L (ref 3.5–5.1)
Sodium: 140 mmol/L (ref 135–145)

## 2024-04-09 LAB — CBC
HCT: 36.8 % — ABNORMAL LOW (ref 39.0–52.0)
Hemoglobin: 11.3 g/dL — ABNORMAL LOW (ref 13.0–17.0)
MCH: 26.3 pg (ref 26.0–34.0)
MCHC: 30.7 g/dL (ref 30.0–36.0)
MCV: 85.6 fL (ref 80.0–100.0)
Platelets: 467 K/uL — ABNORMAL HIGH (ref 150–400)
RBC: 4.3 MIL/uL (ref 4.22–5.81)
RDW: 16.8 % — ABNORMAL HIGH (ref 11.5–15.5)
WBC: 15 K/uL — ABNORMAL HIGH (ref 4.0–10.5)
nRBC: 0 % (ref 0.0–0.2)

## 2024-04-09 MED ORDER — METHYLPREDNISOLONE SODIUM SUCC 125 MG IJ SOLR
125.0000 mg | Freq: Once | INTRAMUSCULAR | Status: AC
  Administered 2024-04-09: 125 mg via INTRAVENOUS
  Filled 2024-04-09: qty 2

## 2024-04-09 MED ORDER — IOHEXOL 300 MG/ML  SOLN
75.0000 mL | Freq: Once | INTRAMUSCULAR | Status: AC | PRN
  Administered 2024-04-09: 75 mL via INTRAVENOUS

## 2024-04-09 MED ORDER — IPRATROPIUM BROMIDE 0.02 % IN SOLN
0.5000 mg | Freq: Once | RESPIRATORY_TRACT | Status: AC
  Administered 2024-04-09: 0.5 mg via RESPIRATORY_TRACT
  Filled 2024-04-09: qty 2.5

## 2024-04-09 MED ORDER — PREDNISONE 10 MG (21) PO TBPK: ORAL_TABLET | Freq: Every day | ORAL | 0 refills | Status: DC

## 2024-04-09 MED ORDER — BENZONATATE 100 MG PO CAPS: 200.0000 mg | ORAL_CAPSULE | Freq: Three times a day (TID) | ORAL | 0 refills | Status: DC

## 2024-04-09 MED ORDER — ALBUTEROL SULFATE HFA 108 (90 BASE) MCG/ACT IN AERS
2.0000 | INHALATION_SPRAY | RESPIRATORY_TRACT | Status: DC
  Administered 2024-04-09: 2 via RESPIRATORY_TRACT
  Filled 2024-04-09: qty 6.7

## 2024-04-09 MED ORDER — ALBUTEROL SULFATE (2.5 MG/3ML) 0.083% IN NEBU
5.0000 mg | INHALATION_SOLUTION | Freq: Once | RESPIRATORY_TRACT | Status: AC
  Administered 2024-04-09: 5 mg via RESPIRATORY_TRACT
  Filled 2024-04-09: qty 6

## 2024-04-09 NOTE — ED Provider Notes (Signed)
 [place] EMERGENCY DEPARTMENT AT [facility] Provider Note   CSN: [id number] Arrival date & time: 04/09/24  1925     Patient presents with: Shortness of Breath   Chase Moreno is a 73 y.o. male.   73 year old male presents with increased cough congestion as well as near syncope.  Family at bedside who also gives a history.  Patient states that he has had a cough for quite some time.  States he goes into bruxism's which then caused him to almost pass out and gag.  When he is not coughing he feels at his baseline.  Was seen at [facility] for this recently and was told he needs to follow-up with the pulmonary.  He denies any fever or chills.  No cardiac or CHF type symptoms.       Prior to Admission medications   Medication Sig Start Date End Date Taking? Authorizing Provider  aspirin  EC 81 MG tablet Take 81 mg by mouth daily. Swallow whole.    [provider]  atorvastatin  (LIPITOR) 10 MG tablet Take 1 tablet (10 mg total) by mouth daily. 12/25/23   Bedsole, Amy E, MD  losartan  (COZAAR ) 50 MG tablet Take 1 tablet (50 mg total) by mouth daily. 12/25/23   Avelina Greig BRAVO, MD  predniSONE  (DELTASONE ) 20 MG tablet Take 1 tablet (20 mg total) by mouth daily with breakfast. Patient not taking: Reported on 03/05/2024 02/20/24   Tower, Laine LABOR, MD  promethazine -dextromethorphan  (PROMETHAZINE -DM) 6.25-15 MG/5ML syrup TAKE 5 ML BY MOUTH  THREE TIMES DAILY AS NEEDED FOR COUGH 03/18/24   Bedsole, Amy E, MD  Tdap (BOOSTRIX ) 5-2.5-18.5 LF-MCG/0.5 injection Inject into the muscle. Patient not taking: Reported on 03/05/2024 02/10/24   Luiz Channel, MD    Allergies: Patient has no known allergies.    Review of Systems  All other systems reviewed and are negative.   Updated Vital Signs BP (!) 143/96   Pulse 87   Temp 97.9 F (36.6 C) (Oral)   Resp 17   SpO2 97%   Physical Exam Vitals and nursing note reviewed.  Constitutional:      General: He is not in acute distress.     Appearance: Normal appearance. He is well-developed. He is not toxic-appearing.  HENT:     Head: Normocephalic and atraumatic.  Eyes:     General: Lids are normal.     Conjunctiva/sclera: Conjunctivae normal.     Pupils: Pupils are equal, round, and reactive to light.  Neck:     Thyroid: No thyroid mass.     Trachea: No tracheal deviation.  Cardiovascular:     Rate and Rhythm: Normal rate and regular rhythm.     Heart sounds: Normal heart sounds. No murmur heard.    No gallop.  Pulmonary:     Effort: Pulmonary effort is normal. Prolonged expiration present. No respiratory distress.     Breath sounds: No stridor. Examination of the right-upper field reveals wheezing. Examination of the left-upper field reveals wheezing. Wheezing present. No decreased breath sounds, rhonchi or rales.  Abdominal:     General: There is no distension.     Palpations: Abdomen is soft.     Tenderness: There is no abdominal tenderness. There is no rebound.  Musculoskeletal:        General: No tenderness. Normal range of motion.     Cervical back: Normal range of motion and neck supple.  Skin:    General: Skin is warm and dry.  Findings: No abrasion or rash.  Neurological:     Mental Status: He is alert and oriented to person, place, and time. Mental status is at baseline.     GCS: GCS eye subscore is 4. GCS verbal subscore is 5. GCS motor subscore is 6.     Cranial Nerves: No cranial nerve deficit.     Sensory: No sensory deficit.     Motor: Motor function is intact.  Psychiatric:        Attention and Perception: Attention normal.        Speech: Speech normal.        Behavior: Behavior normal.     (all labs ordered are listed, but only abnormal results are displayed) Labs Reviewed  BASIC METABOLIC PANEL WITH GFR  CBC    EKG: EKG Interpretation Date/Time:  Friday April 09 2024 19:49:17 EDT Ventricular Rate:  81 PR Interval:  178 QRS Duration:  79 QT Interval:  390 QTC  Calculation: 453 R Axis:   31  Text Interpretation: Sinus rhythm Confirmed by Dasie Faden ([id number]) on 04/09/2024 8:16:05 PM  Radiology: No results found.   Procedures   Medications Ordered in the ED  albuterol  (PROVENTIL ) (2.5 MG/3ML) 0.083% nebulizer solution 5 mg (has no administration in time range)  ipratropium (ATROVENT ) nebulizer solution 0.5 mg (has no administration in time range)  methylPREDNISolone  sodium succinate (SOLU-MEDROL ) 125 mg/2 mL injection 125 mg (has no administration in time range)                                    Medical Decision Making Amount and/or Complexity of Data Reviewed Labs: ordered. Radiology: ordered.  Risk Prescription drug management.   Patient given albuterol  Atrovent  does feel better.  Did have some element of bronchospasm for some time.  Suspect that this is contributing to his cough which is making him have presyncopal episodes.  He had a chest CT which per my review interpretation showed findings concerning for metastatic cancer of unknown primary.  Patient to receive referral to cancer center here.  Will also give patient antitussive  along with prednisone  albuterol .  Plan discussed at length with 3 of his family members who are at bedside.  Patient instructed to return for worsening trouble breathing or any other issues.  Will also give pulmonary referral as well 2.     Final diagnoses:  None    ED Discharge Orders     None          Dasie Faden, MD 04/09/24 2231

## 2024-04-09 NOTE — Discharge Instructions (Signed)
 The CAT scan of your chest unfortunately was concerning for metastatic cancer of unknown primary.  A referral has been made to the [facility] health cancer center and they should be calling you Monday or Tuesday of next week.  Contact the pulmonology doctor on Monday to schedule a follow-up visit.  Return here for shortness of breath or any other problems

## 2024-04-09 NOTE — ED Triage Notes (Addendum)
 Pt has c/o SHOB, light headedness with coughing fits. Pt family reports he starts shaking while coughing and doesn't respond. Pt recently referred to pulmonology, but appt. Isn't until November

## 2024-04-10 LAB — RESP PANEL BY RT-PCR (RSV, FLU A&B, COVID)  RVPGX2
Influenza A by PCR: NEGATIVE
Influenza B by PCR: NEGATIVE
Resp Syncytial Virus by PCR: NEGATIVE
SARS Coronavirus 2 by RT PCR: NEGATIVE

## 2024-04-13 NOTE — Progress Notes (Signed)
 [facility] [phone]) [phone]   [phone]  INITIAL CONSULTATION:  Patient Care Team: Avelina Greig BRAVO, MD as PCP - Diedre Renda Glance, MD as Consulting Physician (Urology) Nieves Cough, MD as Consulting Physician (Urology) Parrett, Madelin RAMAN, NP as Nurse Practitioner (Pulmonary Disease) Golden Forestine BROCKS, RN as Oncology Nurse Navigator (Medical Oncology)  CHIEF COMPLAINTS/PURPOSE OF CONSULTATION:  Enlarged mediastinal and hilar lymph nodes   HISTORY OF PRESENTING ILLNESS:  Chase Moreno 73 y.o. male with medical history significant for hypertension, type 2 diabetes, allergic rhinitis, OSA, prostate cancer 2018 s/p robotic assisted prostatectomy.  On review of the previous records patient had sepsis 2/2 to pneumonia and was hospitalized 12/21/23 with tx consisting of IV ceftriaxone  and azithromycin  then discharged on levaquin . Prior to that admission was evaluated at [facility] and treated for a virus with cefdinir.  Patient later evaluated by PCP on 02/20/24 for URI symptoms including chills, decreased appetite, cough, and shortness of breath. Chest xray on 8/8 showing mild to moderate bronchitic changes and he was treated with course of prednisone  and promethazine -dm. [facility] ED visit 03/29/24 for evaluation of syncope after coughing. CT chest performed with impression: Bilateral lower lobe predominant nodules in a perilymphatic distribution with prominent mediastinal and hilar lymphadenopathy. This constellation of findings may represent multifocal infection or an inflammatory process such as sarcoidosis or pneumoconiosis. However, underlying malignancy cannot be excluded given the partially visualized retroperitoneal lymphadenopathy and the absence of prior exams for comparison. Consider follow-up CT chest to evaluate for stability or progression. PCP follow up was recommended for further evaluation. Patient later at ED visit with [facility] on 04/09/24 for cough and near syncope. CT chest at that visit showing multiple bilateral pulmonary nodules, including a 1.7 x 1.2 cm right lower lobe nodule. Numerous enlarged mediastinal and bilateral hilar lymph nodes. Findings concerning for metastatic cancer of unknown primary. Labs significant work leukocytosis 15, stable anemia, covid/flu/RSV negative. Patient referred to pulmonology and is scheduled for new patient visit on 04/16/24.  On exam today patient is accompanied by spouse who provides additional history. The cough has persisted and worsened over time, causing him to become lightheaded and occasionally faint. After receiving a breathing treatment and steroids at his most recent ED visit, the frequency and severity of the cough have decreased, and he no longer becomes lightheaded. The cough is now occasional, occurring once or twice a day, and is sometimes dry or accompanied by clear phlegm. No recent fevers, but he does report cough is preceded by an episode of sweating.  He denies night sweats .He reports weight loss, with his weight dropping to 186 pounds from a usual 198 pounds, but has recently regained some weight, now at 195 pounds. He experienced a loss of appetite and weakness, which improved with current steroid treatment. He is currently still on prednisone . Spouse reports patient has had wheezing at rest intermittently for at least 1 year however has not sought evaluation for it.  Socially patient quit smoking approximately 45-50 years ago after smoking for about six years at a rate of a pack per week. He also stopped drinking alcohol around the same time, having only consumed it socially on weekends. He works as a Education officer, environmental. Cancer screenings include: Cologuard 2024 negative and PSA checked on 02/03/24 was 0.01.  MEDICAL HISTORY:  Past Medical History:  Diagnosis Date   Allergic rhinitis, cause unspecified    Cancer (HCC)    prostate cancer 2018   Diabetes mellitus without  complication [facility])    patient denies although dx in 2017 with a Dr Avelina ; last hgA1c was however 5.5    Unspecified essential hypertension    Unspecified sleep apnea    CPAP machine use     SURGICAL HISTORY: Past Surgical History:  Procedure Laterality Date   FRACTURE SURGERY     left arm    LYMPHADENECTOMY Bilateral 02/17/2017   Procedure: LYMPHADENECTOMY;  Surgeon: Renda Glance, MD;  Location: [facility] ORS;  Service: Urology;  Laterality: Bilateral;   ROBOT ASSISTED LAPAROSCOPIC RADICAL PROSTATECTOMY N/A 02/17/2017   Procedure: XI ROBOTIC ASSISTED LAPAROSCOPIC RADICAL PROSTATECTOMY LEVEL 2;  Surgeon: Renda Glance, MD;  Location: [facility]S;  Service: Urology;  Laterality: N/A;   TONSILLECTOMY      SOCIAL HISTORY: Social History   Socioeconomic History   Marital status: Married    Spouse name: Not on file   Number of children: 2   Years of education: Not on file   Highest education level: Not on file  Occupational History   Occupation: machine shop    Comment: Set designer company  Tobacco Use   Smoking status: Former   Smokeless tobacco: Never   Tobacco comments:    quit over 40 years ago  Psychologist, educational Use   Vaping status: Never Used  Substance and Sexual Activity   Alcohol use: No    Alcohol/week: 0.0 standard drinks of alcohol   Drug use: No   Sexual activity: Not on file  Other Topics Concern   Not on file  Social History Narrative   Regular exercise---yes, rabbit hunting, walking 2 times  A week.      Diet---eating fruit and veggies, limiting meat.      Widowed: Remarried.         Social Drivers of Corporate investment banker Strain: Low Risk  (03/04/2024)   Overall Financial Resource Strain (CARDIA)    Difficulty of Paying Living Expenses: Not hard at all  Food Insecurity: No Food Insecurity (03/04/2024)   Hunger Vital Sign    Worried About Running Out of Food in the Last Year: Never true    Ran Out of Food in the Last Year: Never true  Transportation Needs: No  Transportation Needs (01/26/2024)   PRAPARE - Administrator, Civil Service (Medical): No    Lack of Transportation (Non-Medical): No  Physical Activity: Sufficiently Active (03/04/2024)   Exercise Vital Sign    Days of Exercise per Week: 5 days    Minutes of Exercise per Session: 30 min  Stress: No Stress Concern Present (03/04/2024)   [employer] of Occupational Health - Occupational Stress Questionnaire    Feeling of Stress: Not at all  Social Connections: Moderately Integrated (03/04/2024)   Social Connection and Isolation Panel    Frequency of Communication with Friends and Family: More than three times a week    Frequency of Social Gatherings with Friends and Family: More than three times a week    Attends Religious Services: More than 4 times per year    Active Member of [employer] or Organizations: No    Attends Banker Meetings: Never    Marital Status: Married  Catering manager Violence: Not At Risk (03/04/2024)   Humiliation, Afraid, Rape, and Kick questionnaire    Fear of Current or Ex-Partner: No    Emotionally Abused: No    Physically Abused: No    Sexually Abused: No    FAMILY HISTORY: Family History  Problem Relation Age of  Onset   Arthritis Mother    Arrhythmia Mother    Stroke Paternal Grandmother     ALLERGIES:  has no known allergies.  MEDICATIONS:  Current Outpatient Medications  Medication Sig Dispense Refill   aspirin  EC 81 MG tablet Take 81 mg by mouth daily. Swallow whole.     atorvastatin  (LIPITOR) 10 MG tablet Take 1 tablet (10 mg total) by mouth daily. 90 tablet 3   benzonatate  (TESSALON ) 100 MG capsule Take 2 capsules (200 mg total) by mouth every 8 (eight) hours. 21 capsule 0   losartan  (COZAAR ) 50 MG tablet Take 1 tablet (50 mg total) by mouth daily. 90 tablet 3   predniSONE  (DELTASONE ) 20 MG tablet Take 1 tablet (20 mg total) by mouth daily with breakfast. (Patient not taking: Reported on 03/05/2024) 5 tablet 0    predniSONE  (STERAPRED UNI-PAK 21 TAB) 10 MG (21) TBPK tablet Take by mouth daily. Take 6 tabs by mouth daily  for 2 days, then 5 tabs for 2 days, then 4 tabs for 2 days, then 3 tabs for 2 days, 2 tabs for 2 days, then 1 tab by mouth daily for 2 days 42 tablet 0   promethazine -dextromethorphan  (PROMETHAZINE -DM) 6.25-15 MG/5ML syrup TAKE 5 ML BY MOUTH  THREE TIMES DAILY AS NEEDED FOR COUGH 118 mL 0   Tdap (BOOSTRIX ) 5-2.5-18.5 LF-MCG/0.5 injection Inject into the muscle. (Patient not taking: Reported on 03/05/2024) 0.5 mL 0   No current facility-administered medications for this visit.    REVIEW OF SYSTEMS:   All other systems are reviewed and are negative for acute change except as noted in the HPI.  PHYSICAL EXAMINATION: ECOG PERFORMANCE STATUS: 1 - Symptomatic but completely ambulatory  Vitals:   04/14/24 1257  BP: (!) (P) 147/83  Pulse: (P) 66  Resp: (P) 16  Temp: (P) 97.9 F (36.6 C)  SpO2: (P) 99%   Filed Weights   04/14/24 1257  Weight: (P) 195 lb 9 oz (88.7 kg)    Physical Exam Vitals reviewed.  Constitutional:      Appearance: He is not ill-appearing or toxic-appearing.  HENT:     Head: Normocephalic.     Nose: Nose normal.     Mouth/Throat:     Mouth: Mucous membranes are moist.  Eyes:     General: No scleral icterus. Cardiovascular:     Rate and Rhythm: Normal rate and regular rhythm.     Pulses: Normal pulses.     Heart sounds: Normal heart sounds.  Pulmonary:     Effort: Pulmonary effort is normal. No respiratory distress.     Breath sounds: Normal breath sounds. No stridor. No wheezing, rhonchi or rales.  Abdominal:     General: There is no distension.  Musculoskeletal:        General: Normal range of motion.     Cervical back: Normal range of motion.  Skin:    General: Skin is warm and dry.  Neurological:     Mental Status: He is alert and oriented to person, place, and time.  Psychiatric:        Mood and Affect: Mood normal.       LABORATORY  DATA:  I have reviewed the data as listed    Latest Ref Rng & Units 04/09/2024    7:58 PM 03/05/2024   10:50 AM 12/25/2023   11:01 AM  CBC  WBC 4.0 - 10.5 K/uL 15.0  10.4  12.3   Hemoglobin 13.0 - 17.0 g/dL 88.6  12.6  12.2   Hematocrit 39.0 - 52.0 % 36.8  38.7  38.4   Platelets 150 - 400 K/uL 467  345.0  346        Latest Ref Rng & Units 04/09/2024    7:58 PM 03/05/2024   10:50 AM 02/03/2024    9:27 AM  CMP  Glucose 70 - 99 mg/dL 96  97  894   BUN 8 - 23 mg/dL 12  14  15    Creatinine 0.61 - 1.24 mg/dL 8.80  8.87  8.90   Sodium 135 - 145 mmol/L 140  141  142   Potassium 3.5 - 5.1 mmol/L 4.2  4.1  4.3   Chloride 98 - 111 mmol/L 103  106  107   CO2 22 - 32 mmol/L 26  27  28    Calcium  8.9 - 10.3 mg/dL 9.5  9.1  8.7   Total Protein 6.0 - 8.3 g/dL  8.2  7.2   Total Bilirubin 0.2 - 1.2 mg/dL  0.4  0.3   Alkaline Phos 39 - 117 U/L  79  65   AST 0 - 37 U/L  22  24   ALT 0 - 53 U/L  23  21      RADIOGRAPHIC STUDIES: I have personally reviewed the radiological images as listed and agreed with the findings in the report. CT Chest W Contrast Result Date: 04/09/2024 EXAM: CT CHEST WITH CONTRAST 04/09/2024 09:29:47 PM TECHNIQUE: CT of the chest was performed with the administration of 75 mL of iohexol  (OMNIPAQUE ) 300 MG/ML solution. Multiplanar reformatted images are provided for review. Automated exposure control, iterative reconstruction, and/or weight based adjustment of the mA/kV was utilized to reduce the radiation dose to as low as reasonably achievable. COMPARISON: Chest radiograph progress 02/13/2024. CLINICAL HISTORY: Abnormal x-ray - lung nodule, >= 1 cm. Shortness of breath, abnormal x-ray, lung nodule. FINDINGS: MEDIASTINUM: Heart and pericardium are unremarkable. The central airways are clear. LYMPH NODES: Numerous enlarged mediastinal and bilateral hilar lymph nodes. For example, a 2.7 x 3.9 cm right hilar node on series 2 image 29, a 2 x 1 cm pretracheal node on series 2 image 23,  and a 1.1 cm left hilar node on series 2 image 32. No axillary lymphadenopathy. LUNGS AND PLEURA: Multiple bilateral pulmonary nodules. For example, in the right upper lobe on series 4 image 73 measuring 8 mm; in the right lower lobe measuring 1.7 x 1.2 cm (series 4 image 102); and in the medial left lower lobe measuring 8 mm (series 4 image 125). No focal consolidation or pulmonary edema. No pleural effusion or pneumothorax. SOFT TISSUES/BONES: No acute abnormality of the bones or soft tissues. No destructive osseous lesion. UPPER ABDOMEN: Limited images of the upper abdomen demonstrates no acute abnormality. IMPRESSION: 1. Multiple bilateral pulmonary nodules, including a 1.7 x 1.2 cm right lower lobe nodule. Numerous enlarged mediastinal and bilateral hilar lymph nodes. Findings concerning for metastatic cancer of unknown primary. Electronically signed by: Norman Gatlin MD 04/09/2024 09:50 PM EDT RP Workstation: [id number]    ASSESSMENT & PLAN Chase Moreno is a 73 y.o. male presenting to the [facility] for consultation regarding mediastinal and hilar lymphadenopathy. Patient will proceed with laboratory workup today.   #Mediastinal and hilar lymphadenopathy - Differentials include infectious process, lymphoproliferative disorder or metastatic disease. Less likely inflammatory process, - Reviewed recent ED work ups with patient including those at [facility] and [facility]. Patient previously treated with antibiotics. - Will check basic labs today including CBC, CMP  and LDH.  - Will order PET for further evaluation. - Patient is scheduled on 04/16/24 to establish care with pulmonologist. Encouraged to keep that appointment, anticipate will need bronchoscopy.    #Age related screenings - UTD  -Patient will RTC when work up is complete.  Patient expressed understanding of the recommended workup and is agreeable to move forward.   All questions were answered. The patient knows to call the clinic  with any problems, questions or concerns.  Shared visit with Dr. Sherrod.  Orders Placed This Encounter  Procedures   NM PET Image Initial (PI) Skull Base To Thigh    [facility] pt    Standing Status:   Future    Expected Date:   04/15/2024    Expiration Date:   04/14/2025    If indicated for the ordered procedure, I authorize the administration of a radiopharmaceutical per Radiology protocol:   Yes    Preferred imaging location?:   [facility]   CBC with Differential (Cancer Center Only)    Standing Status:   Future    Number of Occurrences:   1    Expiration Date:   04/14/2025   CMP (Cancer Center only)    Standing Status:   Future    Number of Occurrences:   1    Expiration Date:   04/14/2025   Lactate dehydrogenase (LDH)    Standing Status:   Future    Number of Occurrences:   1    Expiration Date:   04/14/2025      I have spent a total of 60 minutes minutes of face-to-face and non-face-to-face time, preparing to see the patient, obtaining and/or reviewing separately obtained history, performing a medically appropriate examination, counseling and educating the patient, ordering medications/tests/procedures, referring and communicating with other health care professionals, documenting clinical information in the electronic health record, independently interpreting results and communicating results to the patient, and care coordination.   Ming Mcmannis Walisiewicz PA-C Department of Hematology/Oncology [facility] at [facility] Phone: [phone]  ADDENDUM: Hematology/Oncology Attending:  The patient is seen and examined today.  I reviewed his record, lab, scan and recommended his care plan.  This is a very pleasant 73 years old African-American male with past medical history significant for obstructive sleep apnea, prostate cancer in 2018 status post prostatectomy, diabetes mellitus as well as hypertension.  The patient mentioned that he was  diagnosed with pneumonia in June 2025 treated at that time with a course of antibiotics.  Over the last several weeks he has been complaining of upper respiratory symptoms including cough as well as shortness of breath with decreased appetite and chills.  He also complained of sweating before the cough.  Chest x-ray in August 2025 showed mild to moderate bronchitic changes and he was treated with prednisone  and cough medication.  The patient then presented to emergency department at [facility] on 03/29/2024 with a syncopal episode after cough and CT of the chest performed at that time showed bilateral lower lobe predominant nodules in perilymphatic distribution with prominent mediastinal and hilar lymphadenopathy suspicious for multifocal infection or inflammatory process such as sarcoidosis or pneumoconiosis but malignancy could not be completely excluded.  He had another repeat CT scan of the chest at [facility] health emergency department on 04/09/2024 and again showed the multiple bilateral pulmonary nodules including 1.7 x 1.2 cm right lower lobe nodule and numerous enlarged mediastinal and bilateral hilar lymph nodes concerning for metastatic disease of unknown primary.  The patient was referred to us  today for further evaluation and recommendation regarding treatment of his condition.  He is feeling fine except for the cough and shortness of breath with exertion in addition to sweating. I had a lengthy discussion with the patient and his wife today about his current condition.  I recommended for him to have a PET scan for further evaluation of his disease.  He is also scheduled to see pulmonary medicine with Dr. Zaida later this week for consideration of bronchoscopy and biopsy of the lung nodule as well as mediastinal lymphadenopathy. We will arrange for the patient a follow-up appointment in around 2-3 weeks depending on the final pathology and the staging workup. The patient was advised to call  immediately if he has any other concerning symptoms in the interval. The total time spent in the appointment was 60 minutes including review of chart and various tests results, discussions about plan of care and coordination of care plan . Disclaimer: This note was dictated with voice recognition software. Similar sounding words can inadvertently be transcribed and may be missed upon review. Sherrod MARLA Sherrod, MD

## 2024-04-14 ENCOUNTER — Encounter: Payer: Self-pay | Admitting: Medical Oncology

## 2024-04-14 ENCOUNTER — Inpatient Hospital Stay: Payer: Medicare (Managed Care)

## 2024-04-14 ENCOUNTER — Inpatient Hospital Stay: Payer: Medicare (Managed Care) | Attending: Physician Assistant | Admitting: Physician Assistant

## 2024-04-14 DIAGNOSIS — D649 Anemia, unspecified: Secondary | ICD-10-CM | POA: Diagnosis not present

## 2024-04-14 DIAGNOSIS — Z9079 Acquired absence of other genital organ(s): Secondary | ICD-10-CM | POA: Diagnosis not present

## 2024-04-14 DIAGNOSIS — R599 Enlarged lymph nodes, unspecified: Secondary | ICD-10-CM

## 2024-04-14 DIAGNOSIS — Z7982 Long term (current) use of aspirin: Secondary | ICD-10-CM | POA: Insufficient documentation

## 2024-04-14 DIAGNOSIS — Z87891 Personal history of nicotine dependence: Secondary | ICD-10-CM | POA: Insufficient documentation

## 2024-04-14 DIAGNOSIS — Z8546 Personal history of malignant neoplasm of prostate: Secondary | ICD-10-CM | POA: Diagnosis not present

## 2024-04-14 DIAGNOSIS — I10 Essential (primary) hypertension: Secondary | ICD-10-CM | POA: Diagnosis not present

## 2024-04-14 DIAGNOSIS — Z7952 Long term (current) use of systemic steroids: Secondary | ICD-10-CM | POA: Diagnosis not present

## 2024-04-14 DIAGNOSIS — Z79899 Other long term (current) drug therapy: Secondary | ICD-10-CM | POA: Diagnosis not present

## 2024-04-14 DIAGNOSIS — G4733 Obstructive sleep apnea (adult) (pediatric): Secondary | ICD-10-CM | POA: Insufficient documentation

## 2024-04-14 DIAGNOSIS — R918 Other nonspecific abnormal finding of lung field: Secondary | ICD-10-CM | POA: Insufficient documentation

## 2024-04-14 DIAGNOSIS — R590 Localized enlarged lymph nodes: Secondary | ICD-10-CM | POA: Diagnosis not present

## 2024-04-14 DIAGNOSIS — E119 Type 2 diabetes mellitus without complications: Secondary | ICD-10-CM | POA: Diagnosis not present

## 2024-04-14 LAB — CMP (CANCER CENTER ONLY)
ALT: 26 U/L (ref 0–44)
AST: 19 U/L (ref 15–41)
Albumin: 3.6 g/dL (ref 3.5–5.0)
Alkaline Phosphatase: 80 U/L (ref 38–126)
Anion gap: 6 (ref 5–15)
BUN: 22 mg/dL (ref 8–23)
CO2: 27 mmol/L (ref 22–32)
Calcium: 8.7 mg/dL — ABNORMAL LOW (ref 8.9–10.3)
Chloride: 106 mmol/L (ref 98–111)
Creatinine: 1.06 mg/dL (ref 0.61–1.24)
GFR, Estimated: >60 mL/min (ref >60)
Glucose, Bld: 107 mg/dL — ABNORMAL HIGH (ref 70–99)
Potassium: 4.2 mmol/L (ref 3.5–5.1)
Sodium: 139 mmol/L (ref 135–145)
Total Bilirubin: 0.3 mg/dL (ref 0.0–1.2)
Total Protein: 8 g/dL (ref 6.5–8.1)

## 2024-04-14 LAB — CBC WITH DIFFERENTIAL (CANCER CENTER ONLY)
Abs Immature Granulocytes: 0.2 K/uL — ABNORMAL HIGH (ref 0.00–0.07)
Basophils Absolute: 0 K/uL (ref 0.0–0.1)
Basophils Relative: 0 %
Eosinophils Absolute: 0 K/uL (ref 0.0–0.5)
Eosinophils Relative: 0 %
HCT: 37.4 % — ABNORMAL LOW (ref 39.0–52.0)
Hemoglobin: 11.8 g/dL — ABNORMAL LOW (ref 13.0–17.0)
Immature Granulocytes: 1 %
Lymphocytes Relative: 5 %
Lymphs Abs: 0.8 K/uL (ref 0.7–4.0)
MCH: 26.1 pg (ref 26.0–34.0)
MCHC: 31.6 g/dL (ref 30.0–36.0)
MCV: 82.7 fL (ref 80.0–100.0)
Monocytes Absolute: 0.4 K/uL (ref 0.1–1.0)
Monocytes Relative: 2 %
Neutro Abs: 16 K/uL — ABNORMAL HIGH (ref 1.7–7.7)
Neutrophils Relative %: 92 %
Platelet Count: 478 K/uL — ABNORMAL HIGH (ref 150–400)
RBC: 4.52 MIL/uL (ref 4.22–5.81)
RDW: 17 % — ABNORMAL HIGH (ref 11.5–15.5)
WBC Count: 17.4 K/uL — ABNORMAL HIGH (ref 4.0–10.5)
nRBC: 0 % (ref 0.0–0.2)

## 2024-04-14 LAB — LACTATE DEHYDROGENASE: LDH: 157 U/L (ref 98–192)

## 2024-04-14 NOTE — Progress Notes (Signed)
 [facility]  Patient presented to clinic, with spouse,his scheduled appointment with PA-C Mallie Combes. I introduced myself and provided them with my direct contact information. Patient and spouse were both encouraged to call me with any questions/concerns they may have.  Chase KYM Raider, RN, BSN, [facility] Oncology Nurse Navigator, [facility] 04/14/2024 1:40 PM

## 2024-04-14 NOTE — Patient Instructions (Signed)
 Diagnostic Clinic Office Visit Discharge Information and Instructions  Thank you for choosing [facility] for your healthcare needs.  Below is a summary of today's discussion, along with our contact information and an outline of what to expect next.  Reason for Visit:  enlarged lymph nodes  Proposed Diagnostic Care Plan: Labs collected today. PET scan ordered. We have reached out to the authorizations department. Once your insurnace approves the scan you will be contacted to schedule it. Keep appointment with pulmonologist for further work up.   What to Expect: - Generally, when lab tests are ordered the results can take up to 1 week for results to be available.  At that point, we will contact you to discuss your results with you.  Unless there is a critical result, we will typically wait for all of your lab results to be available before contacting you. - If a biopsy is part of your Care Plan, those results can take on average 7-10 days to result.  Once results are available, we will contact you to discuss your pathology results and any next steps. - If you have additional imaging ordered, such as a CT Scan, MRI, Ultrasound, Bone Scan, or PET scan, your imaging will need to be authorized then scheduled with the earliest available appointment.  You may be asked to travel to another hospital within [facility] who has a sooner availability, please consider doing so if asked. - If you use MyChart, your results will be available to you in the MyChart portal.  Your provider will be in touch with you as soon as all of your results are available to be discussed.  Your Diagnostic Clinic Provider:  Darianny Momon Walisiewicz PA-C and Dr. Sherrod Your Diagnostic Navigator:  Colene Raider RN, office number [phone]  If you or your caregiver have number blocking on your cell phones, please ensure the cancer center's numbers are not blocked.  If you are not a registered MyChart user, please consider  enrolling in MyChart to receive your test results and visit notes.  You can also access your discharge instructions electronically.  MyChart also gives you an electronic means to communicate with your Care Team instead of needing to call in to the cancer center.  We appreciate you trusting us  with your healthcare and look forward to partnering with you as we work to uncover what your potential diagnosis may be.  Please do not hesitate to reach out at any point with questions or concerns.

## 2024-04-15 ENCOUNTER — Encounter (HOSPITAL_COMMUNITY)
Admission: RE | Admit: 2024-04-15 | Discharge: 2024-04-15 | Disposition: A | Discharge status: Home / Self Care | Payer: Medicare (Managed Care) | Source: Ambulatory Visit | Attending: Physician Assistant | Admitting: Physician Assistant

## 2024-04-15 ENCOUNTER — Encounter (HOSPITAL_COMMUNITY): Payer: Self-pay

## 2024-04-15 ENCOUNTER — Encounter: Payer: Self-pay | Admitting: Medical Oncology

## 2024-04-15 ENCOUNTER — Ambulatory Visit (HOSPITAL_COMMUNITY): Payer: Medicare (Managed Care)

## 2024-04-15 DIAGNOSIS — R599 Enlarged lymph nodes, unspecified: Secondary | ICD-10-CM | POA: Insufficient documentation

## 2024-04-16 ENCOUNTER — Encounter: Payer: Self-pay | Admitting: Pulmonary Disease

## 2024-04-16 ENCOUNTER — Ambulatory Visit: Payer: Medicare (Managed Care)

## 2024-04-16 ENCOUNTER — Telehealth: Payer: Self-pay

## 2024-04-16 VITALS — BP 130/68 | HR 61 | Temp 98.1°F | Ht 67.0 in | Wt 195.6 lb

## 2024-04-16 DIAGNOSIS — Z8546 Personal history of malignant neoplasm of prostate: Secondary | ICD-10-CM

## 2024-04-16 DIAGNOSIS — R1312 Dysphagia, oropharyngeal phase: Secondary | ICD-10-CM

## 2024-04-16 DIAGNOSIS — R053 Chronic cough: Secondary | ICD-10-CM

## 2024-04-16 DIAGNOSIS — R590 Localized enlarged lymph nodes: Secondary | ICD-10-CM

## 2024-04-16 DIAGNOSIS — R918 Other nonspecific abnormal finding of lung field: Secondary | ICD-10-CM | POA: Diagnosis not present

## 2024-04-16 MED ORDER — FLUTICASONE-SALMETEROL 500-50 MCG/ACT IN AEPB: 1.0000 | INHALATION_SPRAY | Freq: Two times a day (BID) | RESPIRATORY_TRACT | 6 refills | Status: DC

## 2024-04-16 NOTE — Progress Notes (Signed)
 Subjective:   PATIENT ID: Chase Moreno GENDER: male DOB: 1951-02-27, MRN: [id number]   HPI 73 year old male with a past medical history of prostate cancer on surveillance, hypertension, sleep apnea on CPAP, recent hospitalization for right lower lobe  pneumonia in June with a possible parapneumonic effusion that has resolved.  Patient had a persistent cough afterwards and workup included getting a CT scan in one of the ERs where he presented with cough and syncope.  CT was concerning for multiple pulmonary nodules bilaterally and mediastinal lymphadenopathy.  He is already ordered for a PET scan.  Patient reports ongoing cough, he denies any fevers or chills.  He also reports oropharyngeal dysphagia with sensation of choking at least twice a week.  He reports no appetite and weight loss of about 10 pounds in the past month.  He states that ever since he was started on prednisone  last Saturday his cough improved significantly and his appetite improved as well.  He has history of allergic rhinitis.  He has no acid reflux.  He has a very remote limited smoking history of about 1 pack a week for about 5 years.  Past Medical History:  Diagnosis Date   Allergic rhinitis, cause unspecified    Cancer (HCC)    prostate cancer 2018   Diabetes mellitus without complication (HCC)    patient denies although dx in 2017 with a Dr Avelina ; last hgA1c was however 5.5    Unspecified essential hypertension    Unspecified sleep apnea    CPAP machine use      Family History  Problem Relation Age of Onset   Arthritis Mother    Arrhythmia Mother    Stroke Paternal Grandmother      Social History   Socioeconomic History   Marital status: Married    Spouse name: Not on file   Number of children: 2   Years of education: Not on file   Highest education level: Not on file  Occupational History   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  Vaping Use   Vaping status: Never Used  Substance and Sexual Activity   Alcohol use: No    Alcohol/week: 0.0 standard drinks of alcohol   Drug use: No   Sexual activity: Not on file  Other Topics Concern   Not on file  Social History Narrative   Regular exercise---yes, rabbit hunting, walking 2 times  A week.      Diet---eating fruit and veggies, limiting meat.      Widowed: Remarried.         Social Drivers of Corporate investment banker Strain: Low Risk  (03/04/2024)   Overall Financial Resource Strain (CARDIA)    Difficulty of Paying Living Expenses: Not hard at all  Food Insecurity: No Food Insecurity (03/04/2024)   Hunger Vital Sign    Worried About Running Out of Food in the Last Year: Never true    Ran Out of Food in the Last Year: Never true  Transportation Needs: No Transportation Needs (01/26/2024)   PRAPARE - Administrator, Civil Service (Medical): No    Lack of Transportation (Non-Medical): No  Physical Activity: Sufficiently Active (03/04/2024)   Exercise Vital Sign    Days of Exercise  per Week: 5 days    Minutes of Exercise per Session: 30 min  Stress: No Stress Concern Present (03/04/2024)   [employer] of Occupational Health - Occupational Stress Questionnaire    Feeling of Stress: Not at all  Social Connections: Moderately Integrated (03/04/2024)   Social Connection and Isolation Panel    Frequency of Communication with Friends and Family: More than three times a week    Frequency of Social Gatherings with Friends and Family: More than three times a week    Attends Religious Services: More than 4 times per year    Active Member of [employer] or Organizations: No    Attends Banker Meetings: Never    Marital Status: Married  Catering manager Violence: Not At Risk (03/04/2024)   Humiliation, Afraid, Rape,  and Kick questionnaire    Fear of Current or Ex-Partner: No    Emotionally Abused: No    Physically Abused: No    Sexually Abused: No     No Known Allergies   Outpatient Medications Prior to Visit  Medication Sig Dispense Refill   aspirin  EC 81 MG tablet Take 81 mg by mouth daily. Swallow whole.     atorvastatin  (LIPITOR) 10 MG tablet Take 1 tablet (10 mg total) by mouth daily. 90 tablet 3   benzonatate  (TESSALON ) 100 MG capsule Take 2 capsules (200 mg total) by mouth every 8 (eight) hours. 21 capsule 0   cholecalciferol (VITAMIN D3) 25 MCG (1000 UNIT) tablet Take 1,000 Units by mouth daily.     losartan  (COZAAR ) 50 MG tablet Take 1 tablet (50 mg total) by mouth daily. 90 tablet 3   predniSONE  (STERAPRED UNI-PAK 21 TAB) 10 MG (21) TBPK tablet Take by mouth daily. Take 6 tabs by mouth daily  for 2 days, then 5 tabs for 2 days, then 4 tabs for 2 days, then 3 tabs for 2 days, 2 tabs for 2 days, then 1 tab by mouth daily for 2 days 42 tablet 0   zinc sulfate, 50mg  elemental zinc, 220 (50 Zn) MG capsule Take 220 mg by mouth daily.     predniSONE  (DELTASONE ) 20 MG tablet Take 1 tablet (20 mg total) by mouth daily with breakfast. (Patient not taking: Reported on 04/16/2024) 5 tablet 0   promethazine -dextromethorphan  (PROMETHAZINE -DM) 6.25-15 MG/5ML syrup TAKE 5 ML BY MOUTH  THREE TIMES DAILY AS NEEDED FOR COUGH (Patient not taking: Reported on 04/16/2024) 118 mL 0   Tdap (BOOSTRIX ) 5-2.5-18.5 LF-MCG/0.5 injection Inject into the muscle. (Patient not taking: Reported on 04/16/2024) 0.5 mL 0   No facility-administered medications prior to visit.    ROS Reviewed all systems and reported negative except as above     Objective:   Vitals:   04/16/24 0922  BP: 130/68  Pulse: 61  Temp: 98.1 F (36.7 C)  SpO2: 100%  Weight: 195 lb 9.6 oz (88.7 kg)  Height: 5' 7 (1.702 m)    Physical Exam General: Elderly male, very pleasant, not in acute distress Chest: Clear to auscultation  bilaterally Heart: Regular rate and rhythm, normal S1, S2 Abdomen: Soft, nontender Extremities, warm, well-perfused    CBC    Component Value Date/Time   WBC 17.4 (H) 04/14/2024 1348   WBC 15.0 (H) 04/09/2024 1958   RBC 4.52 04/14/2024 1348   HGB 11.8 (L) 04/14/2024 1348   HCT 37.4 (L) 04/14/2024 1348   PLT 478 (H) 04/14/2024 1348   MCV 82.7 04/14/2024 1348   MCH 26.1 04/14/2024 1348  MCHC 31.6 04/14/2024 1348   RDW 17.0 (H) 04/14/2024 1348   LYMPHSABS 0.8 04/14/2024 1348   MONOABS 0.4 04/14/2024 1348   EOSABS 0.0 04/14/2024 1348   BASOSABS 0.0 04/14/2024 1348     Chest imaging: I personally reviewed his CT chest performed on 04/09/2024. Multiple pulmonary nodules and the right lower and left lower lobes.  1 pulmonary nodule that seems more accessible via robotic bronchoscopy in the right upper lobe measuring about 8 mm.  Alternatively can target the right lower lobe nodule measuring 1.7 x 1.2 cm although concern for atelectasis during robotic bronchoscopy.  He is noted to have an abnormal large 4R lymph node and 11R.  His 11 left is less prominent but also enlarged.  PFT: No PFTs on file     Assessment & Plan:   Assessment & Plan LAD (lymphadenopathy), mediastinal Patient noted to have lymphadenopathy with history of prostate cancer.  He has multiple pulmonary nodules concerning for metastatic disease.  Discussed bronchoscopy with risks and benefits.  Benefits would be obtaining tissue diagnosis, risks include bleeding, pneumothorax, pneumonia.  Patient understands these risks and would like to proceed with bronchoscopy with robotic navigation and EBUS with transbronchial needle aspiration.  Will tentatively plan to schedule for October 16.  PET scan already pending Orders:   Procedural/ Surgical Case Request: VIDEO BRONCHOSCOPY WITH ENDOBRONCHIAL NAVIGATION; Future  Lung nodules As above Orders:   Procedural/ Surgical Case Request: VIDEO BRONCHOSCOPY WITH  ENDOBRONCHIAL NAVIGATION; Future  Chronic cough Patient has a chronic cough which improved with prednisone .  Will start Advair to reassess cough.  Can obtain further workup of cough once pulmonary nodules are further clarified.  He is pending a PET scan. Orders:   fluticasone-salmeterol (ADVAIR DISKUS) 500-50 MCG/ACT AEPB; Inhale 1 puff into the lungs in the morning and at bedtime.  Oropharyngeal dysphagia Patient noted to have oropharyngeal dysphagia at least twice a week.  Ordered for modified barium swallow to further assess his dysphagia.  Could be contributing to his recurrent pneumonia episodes and coughing spells. Orders:   SLP modified barium swallow; Future    Zola Herter, MD [facility] Office: [phone]

## 2024-04-16 NOTE — Patient Instructions (Signed)
 It was nice meeting you in the clinic today.  Given that your cough improves with prednisone  I have started you on an inhaled steroid inhaler.  Please use it twice a day and wash your mouth after you use it.   We will wait for your PET scan to be done and then also schedule you for bronchoscopy to look at your lymph nodes and your lung nodules.  We will follow-up with you in the clinic after the PET scan and the bronchoscopy is done.

## 2024-04-16 NOTE — H&P (View-Only) (Signed)
 Subjective:   PATIENT ID: Beryl Elbe GENDER: male DOB: 1951-02-27, MRN: [id number]   HPI 73 year old male with a past medical history of prostate cancer on surveillance, hypertension, sleep apnea on CPAP, recent hospitalization for right lower lobe  pneumonia in June with a possible parapneumonic effusion that has resolved.  Patient had a persistent cough afterwards and workup included getting a CT scan in one of the ERs where he presented with cough and syncope.  CT was concerning for multiple pulmonary nodules bilaterally and mediastinal lymphadenopathy.  He is already ordered for a PET scan.  Patient reports ongoing cough, he denies any fevers or chills.  He also reports oropharyngeal dysphagia with sensation of choking at least twice a week.  He reports no appetite and weight loss of about 10 pounds in the past month.  He states that ever since he was started on prednisone  last Saturday his cough improved significantly and his appetite improved as well.  He has history of allergic rhinitis.  He has no acid reflux.  He has a very remote limited smoking history of about 1 pack a week for about 5 years.  Past Medical History:  Diagnosis Date   Allergic rhinitis, cause unspecified    Cancer (HCC)    prostate cancer 2018   Diabetes mellitus without complication (HCC)    patient denies although dx in 2017 with a Dr Avelina ; last hgA1c was however 5.5    Unspecified essential hypertension    Unspecified sleep apnea    CPAP machine use      Family History  Problem Relation Age of Onset   Arthritis Mother    Arrhythmia Mother    Stroke Paternal Grandmother      Social History   Socioeconomic History   Marital status: Married    Spouse name: Not on file   Number of children: 2   Years of education: Not on file   Highest education level: Not on file  Occupational History   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  Vaping Use   Vaping status: Never Used  Substance and Sexual Activity   Alcohol use: No    Alcohol/week: 0.0 standard drinks of alcohol   Drug use: No   Sexual activity: Not on file  Other Topics Concern   Not on file  Social History Narrative   Regular exercise---yes, rabbit hunting, walking 2 times  A week.      Diet---eating fruit and veggies, limiting meat.      Widowed: Remarried.         Social Drivers of Corporate investment banker Strain: Low Risk  (03/04/2024)   Overall Financial Resource Strain (CARDIA)    Difficulty of Paying Living Expenses: Not hard at all  Food Insecurity: No Food Insecurity (03/04/2024)   Hunger Vital Sign    Worried About Running Out of Food in the Last Year: Never true    Ran Out of Food in the Last Year: Never true  Transportation Needs: No Transportation Needs (01/26/2024)   PRAPARE - Administrator, Civil Service (Medical): No    Lack of Transportation (Non-Medical): No  Physical Activity: Sufficiently Active (03/04/2024)   Exercise Vital Sign    Days of Exercise  per Week: 5 days    Minutes of Exercise per Session: 30 min  Stress: No Stress Concern Present (03/04/2024)   [employer] of Occupational Health - Occupational Stress Questionnaire    Feeling of Stress: Not at all  Social Connections: Moderately Integrated (03/04/2024)   Social Connection and Isolation Panel    Frequency of Communication with Friends and Family: More than three times a week    Frequency of Social Gatherings with Friends and Family: More than three times a week    Attends Religious Services: More than 4 times per year    Active Member of [employer] or Organizations: No    Attends Banker Meetings: Never    Marital Status: Married  Catering manager Violence: Not At Risk (03/04/2024)   Humiliation, Afraid, Rape,  and Kick questionnaire    Fear of Current or Ex-Partner: No    Emotionally Abused: No    Physically Abused: No    Sexually Abused: No     No Known Allergies   Outpatient Medications Prior to Visit  Medication Sig Dispense Refill   aspirin  EC 81 MG tablet Take 81 mg by mouth daily. Swallow whole.     atorvastatin  (LIPITOR) 10 MG tablet Take 1 tablet (10 mg total) by mouth daily. 90 tablet 3   benzonatate  (TESSALON ) 100 MG capsule Take 2 capsules (200 mg total) by mouth every 8 (eight) hours. 21 capsule 0   cholecalciferol (VITAMIN D3) 25 MCG (1000 UNIT) tablet Take 1,000 Units by mouth daily.     losartan  (COZAAR ) 50 MG tablet Take 1 tablet (50 mg total) by mouth daily. 90 tablet 3   predniSONE  (STERAPRED UNI-PAK 21 TAB) 10 MG (21) TBPK tablet Take by mouth daily. Take 6 tabs by mouth daily  for 2 days, then 5 tabs for 2 days, then 4 tabs for 2 days, then 3 tabs for 2 days, 2 tabs for 2 days, then 1 tab by mouth daily for 2 days 42 tablet 0   zinc sulfate, 50mg  elemental zinc, 220 (50 Zn) MG capsule Take 220 mg by mouth daily.     predniSONE  (DELTASONE ) 20 MG tablet Take 1 tablet (20 mg total) by mouth daily with breakfast. (Patient not taking: Reported on 04/16/2024) 5 tablet 0   promethazine -dextromethorphan  (PROMETHAZINE -DM) 6.25-15 MG/5ML syrup TAKE 5 ML BY MOUTH  THREE TIMES DAILY AS NEEDED FOR COUGH (Patient not taking: Reported on 04/16/2024) 118 mL 0   Tdap (BOOSTRIX ) 5-2.5-18.5 LF-MCG/0.5 injection Inject into the muscle. (Patient not taking: Reported on 04/16/2024) 0.5 mL 0   No facility-administered medications prior to visit.    ROS Reviewed all systems and reported negative except as above     Objective:   Vitals:   04/16/24 0922  BP: 130/68  Pulse: 61  Temp: 98.1 F (36.7 C)  SpO2: 100%  Weight: 195 lb 9.6 oz (88.7 kg)  Height: 5' 7 (1.702 m)    Physical Exam General: Elderly male, very pleasant, not in acute distress Chest: Clear to auscultation  bilaterally Heart: Regular rate and rhythm, normal S1, S2 Abdomen: Soft, nontender Extremities, warm, well-perfused    CBC    Component Value Date/Time   WBC 17.4 (H) 04/14/2024 1348   WBC 15.0 (H) 04/09/2024 1958   RBC 4.52 04/14/2024 1348   HGB 11.8 (L) 04/14/2024 1348   HCT 37.4 (L) 04/14/2024 1348   PLT 478 (H) 04/14/2024 1348   MCV 82.7 04/14/2024 1348   MCH 26.1 04/14/2024 1348  MCHC 31.6 04/14/2024 1348   RDW 17.0 (H) 04/14/2024 1348   LYMPHSABS 0.8 04/14/2024 1348   MONOABS 0.4 04/14/2024 1348   EOSABS 0.0 04/14/2024 1348   BASOSABS 0.0 04/14/2024 1348     Chest imaging: I personally reviewed his CT chest performed on 04/09/2024. Multiple pulmonary nodules and the right lower and left lower lobes.  1 pulmonary nodule that seems more accessible via robotic bronchoscopy in the right upper lobe measuring about 8 mm.  Alternatively can target the right lower lobe nodule measuring 1.7 x 1.2 cm although concern for atelectasis during robotic bronchoscopy.  He is noted to have an abnormal large 4R lymph node and 11R.  His 11 left is less prominent but also enlarged.  PFT: No PFTs on file     Assessment & Plan:   Assessment & Plan LAD (lymphadenopathy), mediastinal Patient noted to have lymphadenopathy with history of prostate cancer.  He has multiple pulmonary nodules concerning for metastatic disease.  Discussed bronchoscopy with risks and benefits.  Benefits would be obtaining tissue diagnosis, risks include bleeding, pneumothorax, pneumonia.  Patient understands these risks and would like to proceed with bronchoscopy with robotic navigation and EBUS with transbronchial needle aspiration.  Will tentatively plan to schedule for October 16.  PET scan already pending Orders:   Procedural/ Surgical Case Request: VIDEO BRONCHOSCOPY WITH ENDOBRONCHIAL NAVIGATION; Future  Lung nodules As above Orders:   Procedural/ Surgical Case Request: VIDEO BRONCHOSCOPY WITH  ENDOBRONCHIAL NAVIGATION; Future  Chronic cough Patient has a chronic cough which improved with prednisone .  Will start Advair to reassess cough.  Can obtain further workup of cough once pulmonary nodules are further clarified.  He is pending a PET scan. Orders:   fluticasone-salmeterol (ADVAIR DISKUS) 500-50 MCG/ACT AEPB; Inhale 1 puff into the lungs in the morning and at bedtime.  Oropharyngeal dysphagia Patient noted to have oropharyngeal dysphagia at least twice a week.  Ordered for modified barium swallow to further assess his dysphagia.  Could be contributing to his recurrent pneumonia episodes and coughing spells. Orders:   SLP modified barium swallow; Future    Zola Herter, MD [facility] Office: [phone]

## 2024-04-16 NOTE — Telephone Encounter (Signed)
 Patient has been given letter by [facility]. Routing to [employer] for [employer].

## 2024-04-16 NOTE — Assessment & Plan Note (Signed)
 As above Orders:   Procedural/ Surgical Case Request: VIDEO BRONCHOSCOPY WITH ENDOBRONCHIAL NAVIGATION; Future

## 2024-04-16 NOTE — Assessment & Plan Note (Signed)
 Patient noted to have lymphadenopathy with history of prostate cancer.  He has multiple pulmonary nodules concerning for metastatic disease.  Discussed bronchoscopy with risks and benefits.  Benefits would be obtaining tissue diagnosis, risks include bleeding, pneumothorax, pneumonia.  Patient understands these risks and would like to proceed with bronchoscopy with robotic navigation and EBUS with transbronchial needle aspiration.  Will tentatively plan to schedule for October 16.  PET scan already pending Orders:   Procedural/ Surgical Case Request: VIDEO BRONCHOSCOPY WITH ENDOBRONCHIAL NAVIGATION; Future

## 2024-04-16 NOTE — Telephone Encounter (Signed)
 Please schedule the following:  Provider performing procedure: Dorn Chill Diagnosis: Multiple lung nodules and mediastinal lymphadenopathy  Which side if for nodule / mass? Bilateral, Procedure: Robotic bronch + EBUS  Has patient been spoken to by Provider and given informed consent? Yes Anesthesia: General Do you need Fluro? yes Duration of procedure: 90 minutes   Date: 04/29/24  Time: AM/ PM no preference Location: [facility] endo Does patient have OSA? No DM? No  Or Latex allergy? No Medication Restriction/ Anticoagulate/Antiplatelet:  Only ASA Pre-op Labs Ordered:determined by Anesthesia Imaging request: None  (If, SuperDimension CT Chest, please have STAT courier sent to ENDO)

## 2024-04-19 ENCOUNTER — Other Ambulatory Visit (HOSPITAL_COMMUNITY): Payer: Self-pay | Admitting: *Deleted

## 2024-04-19 DIAGNOSIS — R1310 Dysphagia, unspecified: Secondary | ICD-10-CM

## 2024-04-19 NOTE — Progress Notes (Signed)
 Prabhav Faulkenberry                                          MRN: [id number]   04/19/2024   The [facility] Quality Team Specialist reviewed this patient medical record for the purposes of chart review for care gap closure. The following were reviewed: abstraction for care gap closure-kidney health evaluation for diabetes:eGFR  and uACR.    [facility] Quality Team

## 2024-04-21 NOTE — Addendum Note (Signed)
 Addended by: ZAIDA MASON on: 04/21/2024 05:26 PM   Modules accepted: Level of Service

## 2024-04-22 ENCOUNTER — Ambulatory Visit (HOSPITAL_COMMUNITY)
Admission: RE | Admit: 2024-04-22 | Discharge: 2024-04-22 | Disposition: A | Discharge status: Home / Self Care | Payer: Medicare (Managed Care) | Source: Ambulatory Visit | Attending: Physician Assistant | Admitting: Physician Assistant

## 2024-04-22 DIAGNOSIS — Z8546 Personal history of malignant neoplasm of prostate: Secondary | ICD-10-CM | POA: Insufficient documentation

## 2024-04-22 DIAGNOSIS — R918 Other nonspecific abnormal finding of lung field: Secondary | ICD-10-CM | POA: Insufficient documentation

## 2024-04-22 DIAGNOSIS — M899 Disorder of bone, unspecified: Secondary | ICD-10-CM | POA: Insufficient documentation

## 2024-04-22 DIAGNOSIS — R599 Enlarged lymph nodes, unspecified: Secondary | ICD-10-CM | POA: Insufficient documentation

## 2024-04-22 MED ORDER — FLUDEOXYGLUCOSE F - 18 (FDG) INJECTION
10.0600 | Freq: Once | INTRAVENOUS | Status: AC | PRN
  Administered 2024-04-22: 10.06 via INTRAVENOUS

## 2024-04-23 ENCOUNTER — Telehealth: Payer: Self-pay | Admitting: Physician Assistant

## 2024-04-23 NOTE — Telephone Encounter (Signed)
 I notified Chase Moreno by phone regarding PET scan results after reviewing with covering oncologist Dr. Federico. PET scan is showing  Hypermetabolic lymphadenopathy, pulmonary nodules and bone lesions consistent with metastatic disease. Radiologist did report concern for prostate cancer however patient had recent PSA with was WNL. Our recommendation is to continue with EBUS scheduled for 05/09/24 for diagnostic purposes. All of patient's questions were answered and he expressed understanding of the plan provided.

## 2024-04-27 ENCOUNTER — Encounter: Payer: Self-pay | Admitting: Physician Assistant

## 2024-04-28 ENCOUNTER — Encounter (HOSPITAL_COMMUNITY): Payer: Self-pay | Admitting: Pulmonary Disease

## 2024-04-28 ENCOUNTER — Other Ambulatory Visit: Payer: Self-pay

## 2024-04-28 NOTE — Progress Notes (Signed)
 SDW call  Patient was given pre-op instructions over the phone. Patient verbalized understanding of instructions provided.     PCP - Dr. Greig Ring Cardiologist -  Pulmonary:    PPM/ICD - denies Device Orders - na Rep Notified - na   Chest x-ray - 03/29/2024, CE - [facility] EKG -  04/14/2024 Stress Test - ECHO -  Cardiac Cath -   Sleep Study/sleep apnea/CPAP: OSA with nightly CPAP  Type II diabetic. A1C 6.5 on 02/03/2024. Patient denies having diabetes. Takes no medication, does not check his blood sugar Fasting Blood sugar range: na How often check sugars:na   Blood Thinner Instructions: denies Aspirin  Instructions:states was told to hold DOA, last dose 04/28/2024   ERAS Protcol - NPO   Anesthesia review: Yes. HTN, DM, OSA with CPAP, seen at [facility] ED 9/16 for syncope and cough, seen at [facility] ED 04/09/2024 for SOB   Patient denies shortness of breath, fever and chest pain over the phone call. States he has had a cough for the past 3 months.   Your procedure is scheduled on Thursday April 29, 2024  Report to [facility] Main Entrance A at  0630  A.M., then check in with the Admitting office.  Call this number if you have problems the morning of surgery:  [phone]   If you have any questions prior to your surgery date call [phone]: Open Monday-Friday 8am-4pm If you experience any cold or flu symptoms such as cough, fever, chills, shortness of breath, etc. between now and your scheduled surgery, please notify us  at the above number     Remember:  Do not eat or drink after midnight the night before your surgery  Take these medicines the morning of surgery with A SIP OF WATER:  Atorvastatin , adviar  As of today, STOP taking any Aspirin  (unless otherwise instructed by your surgeon) Aleve, Naproxen, Ibuprofen , Motrin , Advil , Goody's, BC's, all herbal medications, fish oil, and all vitamins.

## 2024-04-29 ENCOUNTER — Ambulatory Visit (HOSPITAL_COMMUNITY): Payer: Medicare (Managed Care)

## 2024-04-29 ENCOUNTER — Encounter (HOSPITAL_COMMUNITY)
Admission: RE | Disposition: A | Discharge status: Home / Self Care | Payer: Self-pay | Source: Home / Self Care | Attending: Pulmonary Disease

## 2024-04-29 ENCOUNTER — Encounter (HOSPITAL_COMMUNITY): Payer: Self-pay | Admitting: Pulmonary Disease

## 2024-04-29 ENCOUNTER — Ambulatory Visit (HOSPITAL_COMMUNITY)
Admission: RE | Admit: 2024-04-29 | Discharge: 2024-04-29 | Disposition: A | Discharge status: Home / Self Care | Payer: Medicare (Managed Care) | Attending: Pulmonary Disease | Admitting: Pulmonary Disease

## 2024-04-29 DIAGNOSIS — C8172 Other classical Hodgkin lymphoma, intrathoracic lymph nodes: Secondary | ICD-10-CM | POA: Insufficient documentation

## 2024-04-29 DIAGNOSIS — R590 Localized enlarged lymph nodes: Secondary | ICD-10-CM

## 2024-04-29 DIAGNOSIS — R053 Chronic cough: Secondary | ICD-10-CM | POA: Diagnosis not present

## 2024-04-29 DIAGNOSIS — Z87891 Personal history of nicotine dependence: Secondary | ICD-10-CM | POA: Diagnosis not present

## 2024-04-29 DIAGNOSIS — G4733 Obstructive sleep apnea (adult) (pediatric): Secondary | ICD-10-CM

## 2024-04-29 DIAGNOSIS — G4730 Sleep apnea, unspecified: Secondary | ICD-10-CM | POA: Insufficient documentation

## 2024-04-29 DIAGNOSIS — Z8546 Personal history of malignant neoplasm of prostate: Secondary | ICD-10-CM | POA: Diagnosis not present

## 2024-04-29 DIAGNOSIS — I10 Essential (primary) hypertension: Secondary | ICD-10-CM

## 2024-04-29 DIAGNOSIS — R1312 Dysphagia, oropharyngeal phase: Secondary | ICD-10-CM | POA: Insufficient documentation

## 2024-04-29 DIAGNOSIS — Z7951 Long term (current) use of inhaled steroids: Secondary | ICD-10-CM | POA: Diagnosis not present

## 2024-04-29 DIAGNOSIS — E119 Type 2 diabetes mellitus without complications: Secondary | ICD-10-CM | POA: Insufficient documentation

## 2024-04-29 DIAGNOSIS — R918 Other nonspecific abnormal finding of lung field: Secondary | ICD-10-CM | POA: Diagnosis not present

## 2024-04-29 HISTORY — PX: ENDOBRONCHIAL ULTRASOUND: SHX5096

## 2024-04-29 HISTORY — PX: VIDEO BRONCHOSCOPY WITH ENDOBRONCHIAL NAVIGATION: SHX6175

## 2024-04-29 LAB — GLUCOSE, CAPILLARY
Glucose-Capillary: 105 mg/dL — ABNORMAL HIGH (ref 70–99)
Glucose-Capillary: 107 mg/dL — ABNORMAL HIGH (ref 70–99)
Glucose-Capillary: 140 mg/dL — ABNORMAL HIGH (ref 70–99)

## 2024-04-29 SURGERY — VIDEO BRONCHOSCOPY WITH ENDOBRONCHIAL NAVIGATION
Anesthesia: General

## 2024-04-29 MED ORDER — FENTANYL CITRATE (PF) 100 MCG/2ML IJ SOLN
INTRAMUSCULAR | Status: AC
  Filled 2024-04-29: qty 2

## 2024-04-29 MED ORDER — FENTANYL CITRATE (PF) 250 MCG/5ML IJ SOLN
INTRAMUSCULAR | Status: DC | PRN
  Administered 2024-04-29 (×2): 50 ug via INTRAVENOUS

## 2024-04-29 MED ORDER — PROPOFOL 10 MG/ML IV BOLUS
INTRAVENOUS | Status: DC | PRN
  Administered 2024-04-29: 150 mg via INTRAVENOUS

## 2024-04-29 MED ORDER — LIDOCAINE 2% (20 MG/ML) 5 ML SYRINGE
INTRAMUSCULAR | Status: DC | PRN
  Administered 2024-04-29: 80 mg via INTRAVENOUS

## 2024-04-29 MED ORDER — CHLORHEXIDINE GLUCONATE 0.12 % MT SOLN
15.0000 mL | Freq: Once | OROMUCOSAL | Status: AC
Start: 2024-04-29 — End: 2024-04-29

## 2024-04-29 MED ORDER — ESMOLOL HCL 100 MG/10ML IV SOLN
INTRAVENOUS | Status: DC | PRN
  Administered 2024-04-29: 50 mg via INTRAVENOUS
  Administered 2024-04-29: 30 mg via INTRAVENOUS

## 2024-04-29 MED ORDER — INSULIN ASPART 100 UNIT/ML IJ SOLN: 0.0000 [IU] | INTRAMUSCULAR | Status: DC | PRN

## 2024-04-29 MED ORDER — PHENYLEPHRINE HCL-NACL 20-0.9 MG/250ML-% IV SOLN
INTRAVENOUS | Status: DC | PRN
  Administered 2024-04-29: 40 ug/min via INTRAVENOUS

## 2024-04-29 MED ORDER — ONDANSETRON HCL 4 MG/2ML IJ SOLN
INTRAMUSCULAR | Status: DC | PRN
  Administered 2024-04-29: 4 mg via INTRAVENOUS

## 2024-04-29 MED ORDER — ACETAMINOPHEN 500 MG PO TABS
1000.0000 mg | ORAL_TABLET | Freq: Once | ORAL | Status: AC
  Administered 2024-04-29: 1000 mg via ORAL
  Filled 2024-04-29: qty 2

## 2024-04-29 MED ORDER — ROCURONIUM BROMIDE 10 MG/ML (PF) SYRINGE
PREFILLED_SYRINGE | INTRAVENOUS | Status: DC | PRN
  Administered 2024-04-29: 60 mg via INTRAVENOUS
  Administered 2024-04-29 (×2): 10 mg via INTRAVENOUS

## 2024-04-29 MED ORDER — DEXAMETHASONE SOD PHOSPHATE PF 10 MG/ML IJ SOLN
INTRAMUSCULAR | Status: DC | PRN
  Administered 2024-04-29: 5 mg via INTRAVENOUS

## 2024-04-29 MED ORDER — LACTATED RINGERS IV SOLN: INTRAVENOUS | Status: DC

## 2024-04-29 MED ORDER — SUGAMMADEX SODIUM 200 MG/2ML IV SOLN
INTRAVENOUS | Status: DC | PRN
  Administered 2024-04-29: 344.8 mg via INTRAVENOUS

## 2024-04-29 MED ORDER — CHLORHEXIDINE GLUCONATE 0.12 % MT SOLN
OROMUCOSAL | Status: AC
  Administered 2024-04-29: 15 mL via OROMUCOSAL
  Filled 2024-04-29: qty 15

## 2024-04-29 NOTE — Transfer of Care (Signed)
 Immediate Anesthesia Transfer of Care Note  Patient: Chase Moreno  Procedure(s) Performed: VIDEO BRONCHOSCOPY WITH ENDOBRONCHIAL NAVIGATION ENDOBRONCHIAL ULTRASOUND (EBUS)  Patient Location: Endoscopy Unit  Anesthesia Type:General  Level of Consciousness: drowsy and responds to stimulation  Airway & Oxygen Therapy: Patient Spontanous Breathing and Patient connected to face mask oxygen  Post-op Assessment: Report given to RN and Post -op Vital signs reviewed and stable  Post vital signs: Reviewed and stable  Last Vitals:  Vitals Value Taken Time  BP 113/61 04/29/24 11:37  Temp    Pulse 106 04/29/24 11:39  Resp 25 04/29/24 11:39  SpO2 98 % 04/29/24 11:39  Vitals shown include unfiled device data.  Last Pain:  Vitals:   04/29/24 0805  TempSrc:   PainSc: 8          Complications: No notable events documented.

## 2024-04-29 NOTE — Anesthesia Preprocedure Evaluation (Signed)
 Anesthesia Evaluation  Patient identified by MRN, date of birth, ID band Patient awake    Reviewed: Allergy & Precautions, H&P , NPO status , Patient's Chart, lab work & pertinent test results  Airway Mallampati: II  TM Distance: >3 FB Neck ROM: Full    Dental no notable dental hx.    Pulmonary sleep apnea , former smoker Lung nodules   Pulmonary exam normal breath sounds clear to auscultation       Cardiovascular hypertension, (-) angina (-) Past MI Normal cardiovascular exam Rhythm:Regular Rate:Normal     Neuro/Psych neg Seizures negative neurological ROS  negative psych ROS   GI/Hepatic negative GI ROS, Neg liver ROS,,,  Endo/Other  diabetes, Type 2    Renal/GU negative Renal ROS  negative genitourinary   Musculoskeletal negative musculoskeletal ROS (+)    Abdominal   Peds negative pediatric ROS (+)  Hematology negative hematology ROS (+)   Anesthesia Other Findings   Reproductive/Obstetrics negative OB ROS                              Anesthesia Physical Anesthesia Plan  ASA: 3  Anesthesia Plan: General   Post-op Pain Management:    Induction: Intravenous  PONV Risk Score and Plan: 2 and Ondansetron , Dexamethasone  and Treatment may vary due to age or medical condition  Airway Management Planned: Oral ETT  Additional Equipment: None  Intra-op Plan:   Post-operative Plan: Extubation in OR  Informed Consent: I have reviewed the patients History and Physical, chart, labs and discussed the procedure including the risks, benefits and alternatives for the proposed anesthesia with the patient or authorized representative who has indicated his/her understanding and acceptance.     Dental advisory given  Plan Discussed with: CRNA  Anesthesia Plan Comments:          Anesthesia Quick Evaluation

## 2024-04-29 NOTE — Op Note (Addendum)
 Video Bronchoscopy with Robotic Assisted Bronchoscopic Navigation   Date of Operation: 04/29/2024   Pre-op Diagnosis: Lung Nodule and Mediastinal Adenopathy  Post-op Diagnosis: Lung Nodule and Mediastinal Adenopathy  Surgeon: Dorn Chill, mD  Assistants: n/a  Anesthesia: General endotracheal anesthesia  Operation: Flexible video fiberoptic bronchoscopy with robotic assistance and biopsies.  Estimated Blood Loss: Minimal  Complications: None  Indications and History: Chase Moreno is a 73 y.o. male with history of prostate cancer with lung nodules mediastinal adenopathy.  Recommendation made to achieve a tissue diagnosis via robotic assisted navigational bronchoscopy.  The risks, benefits, complications, treatment options and expected outcomes were discussed with the patient.  The possibilities of pneumothorax, pneumonia, reaction to medication, pulmonary aspiration, perforation of a viscus, bleeding, failure to diagnose a condition and creating a complication requiring transfusion or operation were discussed with the patient who freely signed the consent.    Description of Procedure: The patient was seen in the Preoperative Area, was examined and was deemed appropriate to proceed.  The patient was taken to [facility] Endoscopy room 3, identified as Chase Moreno and the procedure verified as Flexible Video Fiberoptic Bronchoscopy.  A Time Out was held and the above information confirmed.   Prior to the date of the procedure a high-resolution CT scan of the chest was performed. Utilizing ION software program a virtual tracheobronchial tree was generated to allow the creation of distinct navigation pathways to the patient's parenchymal abnormalities. After being taken to the operating room general anesthesia was initiated and the patient  was orally intubated. The video fiberoptic bronchoscope was introduced via the endotracheal tube and a general inspection was performed which showed normal right  and left lung anatomy. Aspiration of the bilateral mainstems was completed to remove any remaining secretions. Robotic catheter inserted into patient's endotracheal tube.   Target #1 RUL Nodule: The distinct navigation pathways prepared prior to this procedure were then utilized to navigate to patient's lesion identified on CT scan. The robotic catheter was secured into place and the vision probe was withdrawn.  Lesion location was approximated using fluoroscopy and radial EBUS.  Local registration and targeting was performed using [employer] Cios mobile C-arm three-dimensional imaging. Under fluoroscopic guidance transbronchial needle biopsies, and transbronchial forceps biopsies were performed to be sent for cytology and pathology.  Needle-in-lesion was confirmed using Cios mobile C-arm.    EBUS: EBUS bronchoscope was advanced into airway with stations 4R, 7 and 11R biopsied and sent for slide and cell block  The EBUS bronchoscope was removed after assuring no active bleeding from biopsy site.  At the end of the procedure a general airway inspection was performed and there was no evidence of active bleeding. The bronchoscope was removed.  The patient tolerated the procedure well. There was no significant blood loss and there were no obvious complications. A post-procedural chest x-ray is pending.  Samples Target #1: 1. Transbronchial needle biopsies from RUL Nodule 2. Transbronchial forceps biopsies from RUL Nodule   EBUS Samples: Station 4R for slides and cytology Station 7 for slides and cytology Station 11R for slides and cytology   Plans:  The patient will be discharged from the PACU to home when recovered from anesthesia and after chest x-ray is reviewed. We will review the cytology, pathology and microbiology results with the patient when they become available. Outpatient followup will be with Dr. Zaida or Lauraine Lites, NP.

## 2024-04-29 NOTE — Progress Notes (Addendum)
 CBC and CMP were drawn on 04/14/24. No labs needed per dr. Kara and dr. Treen. Per pt he had his Losartan  this am. Dr. Erma is aware.

## 2024-04-29 NOTE — Anesthesia Procedure Notes (Signed)
 Procedure Name: Intubation Date/Time: 04/29/2024 9:15 AM  Performed by: Virgil Ee, CRNAPre-anesthesia Checklist: Patient identified, Patient being monitored, Timeout performed, Emergency Drugs available and Suction available Patient Re-evaluated:Patient Re-evaluated prior to induction Oxygen Delivery Method: Circle system utilized Preoxygenation: Pre-oxygenation with 100% oxygen Induction Type: IV induction Ventilation: Mask ventilation without difficulty, Two handed mask ventilation required and Oral airway inserted - appropriate to patient size Laryngoscope Size: Glidescope and 4 Grade View: Grade I Tube type: Oral Tube size: 8.5 mm Number of attempts: 3 (Mac 4 and Miller 3 attempt unsuccesful) Airway Equipment and Method: Stylet, Video-laryngoscopy and Oral airway Placement Confirmation: ETT inserted through vocal cords under direct vision, positive ETCO2 and breath sounds checked- equal and bilateral Secured at: 24 cm Tube secured with: Tape Dental Injury: Teeth and Oropharynx as per pre-operative assessment  Difficulty Due To: Difficulty was anticipated, Difficult Airway- due to anterior larynx, Difficult Airway- due to reduced neck mobility and Difficult Airway- due to large tongue Comments: Recommend Glidescope intubation in the future

## 2024-04-29 NOTE — Anesthesia Postprocedure Evaluation (Signed)
 Anesthesia Post Note  Patient: Chase Moreno  Procedure(s) Performed: VIDEO BRONCHOSCOPY WITH ENDOBRONCHIAL NAVIGATION ENDOBRONCHIAL ULTRASOUND (EBUS)     Patient location during evaluation: PACU Anesthesia Type: General Level of consciousness: awake and alert Pain management: pain level controlled Vital Signs Assessment: post-procedure vital signs reviewed and stable Respiratory status: spontaneous breathing, nonlabored ventilation, respiratory function stable and patient connected to nasal cannula oxygen Cardiovascular status: blood pressure returned to baseline and stable Postop Assessment: no apparent nausea or vomiting Anesthetic complications: no   No notable events documented.  Last Vitals:  Vitals:   04/29/24 1210 04/29/24 1215  BP: 99/60 99/60  Pulse: 94 95  Resp: (!) 21 (!) 25  Temp:    SpO2: 96% 98%    Last Pain:  Vitals:   04/29/24 1210  TempSrc:   PainSc: 0-No pain                 Thom JONELLE Peoples

## 2024-04-29 NOTE — Procedures (Signed)
 Procedure Note  Patient: Chase Moreno  [employer] Cios mobile C-arm was utilized to identify and biopsy the RUL nodule.  Needle-in-lesion was confirmed using real-time Cios imaging, and images were uploaded to PACS.   Axial View:   Coronal:   Dorn Chill, MD [facility] Office: [phone]   See Amion for personal pager PCCM on call pager [phone] until 7pm. Please call Elink 7p-7a. [phone]

## 2024-04-29 NOTE — Interval H&P Note (Signed)
 History and Physical Interval Note:  04/29/2024 6:45 AM  Chase Moreno  has presented today for surgery, with the diagnosis of LAD and multiple lung nodules.  The various methods of treatment have been discussed with the patient and family. After consideration of risks, benefits and other options for treatment, the patient has consented to  Procedure(s): VIDEO BRONCHOSCOPY WITH ENDOBRONCHIAL NAVIGATION (N/A) ENDOBRONCHIAL ULTRASOUND (EBUS) (N/A) as a surgical intervention.  The patient's history has been reviewed, patient examined, no change in status, stable for surgery.  I have reviewed the patient's chart and labs.  Questions were answered to the patient's satisfaction.     Dorn KATHEE Chill

## 2024-05-02 ENCOUNTER — Encounter (HOSPITAL_COMMUNITY): Payer: Self-pay | Admitting: Pulmonary Disease

## 2024-05-04 ENCOUNTER — Ambulatory Visit (HOSPITAL_COMMUNITY)
Admission: RE | Admit: 2024-05-04 | Discharge: 2024-05-04 | Disposition: A | Discharge status: Home / Self Care | Payer: Medicare (Managed Care) | Source: Ambulatory Visit | Attending: Family Medicine | Admitting: Family Medicine

## 2024-05-04 DIAGNOSIS — R1310 Dysphagia, unspecified: Secondary | ICD-10-CM | POA: Diagnosis not present

## 2024-05-04 DIAGNOSIS — R1312 Dysphagia, oropharyngeal phase: Secondary | ICD-10-CM

## 2024-05-04 NOTE — Evaluation (Signed)
 Modified Barium Swallow Study  Patient Details  Name: Chase Moreno MRN: [id number] Date of Birth: 27-Aug-1950  Today's Date: 05/04/2024  Modified Barium Swallow completed.  Full report located under Chart Review in the Imaging Section.  History of Present Illness Chase Moreno is a 73 y.o. male who was referred by [facility] for an OP MBS. PMHx prostate cancer with lung nodules, recent bronchoscopy, DM, sleep apnea, recurrent pna.  Reports dysphagia with choking at least twice per week.  Improvements noted since he started on prednisone .   Clinical Impression Pt presents with normal oropharyngeal swallow with no aspiration.  There was adequate oral control and mastication.  Onset of the pharyngeal swallow was timely with normal mobility of the laryngeal complex.  There was sufficient contraction of the pharynx and normal propulsion through the UES.  There was one incident of transient penetration (PAS 2) of thin liquids when swallowing the pill, considered WFL. Prominent Cricopharyngeus. No dysphagia.  Continue current diet, thin liquids.   Factors that may increase risk of adverse event in presence of aspiration Noe & Lianne 2021):  none  Swallow Evaluation Recommendations Recommendations: PO diet PO Diet Recommendation: Regular;Thin liquids (Level 0) Liquid Administration via: Cup;Straw Medication Administration: Whole meds with liquid Supervision: Patient able to self-feed Oral care recommendations: Oral care BID (2x/day)    Deepa Barthel L. Vona, MA CCC/SLP Clinical Specialist - Acute Care SLP Acute Rehabilitation Services Office number [phone]   Vona Palma Laurice 05/04/2024,2:03 PM

## 2024-05-05 LAB — CYTOLOGY - NON PAP

## 2024-05-06 ENCOUNTER — Ambulatory Visit: Payer: Self-pay | Admitting: Family Medicine

## 2024-05-06 ENCOUNTER — Telehealth: Payer: Self-pay | Admitting: Physician Assistant

## 2024-05-06 ENCOUNTER — Telehealth: Payer: Self-pay | Admitting: *Deleted

## 2024-05-06 NOTE — Progress Notes (Signed)
  [facility]  Diagnostic Nurse Navigator Treatment Team Hand-Off Note  05/06/24  Patient Name:  Chase Moreno Patient MRN:  [id number] Patient DOB:  [birth year]   Patient Care Team: Avelina Greig BRAVO, MD as PCP - [place] Renda Glance, MD as Consulting Physician (Urology) Nieves Cough, MD as Consulting Physician (Urology) Parrett, Madelin RAMAN, NP as Nurse Practitioner (Pulmonary Disease) Golden Forestine BROCKS, RN as Oncology Nurse Navigator (Medical Oncology)  Chief Complaint Lymphoma   Oncology History   No history exists.    Cancer Staging  No matching staging information was found for the patient.   SDOH Screening and Interventions Updated:  Yes  SDOH Screenings   Food Insecurity: No Food Insecurity (03/04/2024)  Housing: Unknown (03/30/2024)   Received from [facility]  Transportation Needs: No Transportation Needs (01/26/2024)  Utilities: Not At Risk (03/04/2024)  Alcohol Screen: Low Risk  (03/04/2024)  Depression (PHQ2-9): Low Risk  (03/04/2024)  Recent Concern: Depression (PHQ2-9) - Medium Risk (12/25/2023)  Financial Resource Strain: Low Risk  (03/04/2024)  Physical Activity: Sufficiently Active (03/04/2024)  Social Connections: Moderately Integrated (03/04/2024)  Stress: No Stress Concern Present (03/04/2024)  Tobacco Use: Medium Risk (04/29/2024)  Health Literacy: Adequate Health Literacy (03/04/2024)     Genetics Assessment Completed:  No Genetics Referral Made:  not applicable  Care Team Updated:  Yes  Selinda An, RN Oncology Nurse Navigator, [facility]  05/06/2024 10:29 AM

## 2024-05-06 NOTE — Telephone Encounter (Signed)
 Received vm message from pt's wife. She is requesting a call back.  TCT [employer]. No answer but was able to leave vm message asking her to return this call to [phone] at her convenience.

## 2024-05-06 NOTE — Telephone Encounter (Signed)
 I notified Chase Moreno and spouse by phone regarding biopsy results. Findings are consistent with Hodgkin's lymphoma. Patient is scheduled to follow up with Dr. Federico to finalize treatment recommendations on 10/28/5. All questions were answered and they expressed understanding of the plan provided.

## 2024-05-07 ENCOUNTER — Ambulatory Visit: Payer: Medicare (Managed Care) | Admitting: Acute Care

## 2024-05-07 ENCOUNTER — Encounter: Payer: Self-pay | Admitting: Acute Care

## 2024-05-07 VITALS — BP 152/89 | HR 77 | Temp 97.7°F | Ht 67.0 in | Wt 195.8 lb

## 2024-05-07 DIAGNOSIS — R590 Localized enlarged lymph nodes: Secondary | ICD-10-CM

## 2024-05-07 DIAGNOSIS — Z9889 Other specified postprocedural states: Secondary | ICD-10-CM

## 2024-05-07 DIAGNOSIS — R911 Solitary pulmonary nodule: Secondary | ICD-10-CM

## 2024-05-07 DIAGNOSIS — R9389 Abnormal findings on diagnostic imaging of other specified body structures: Secondary | ICD-10-CM

## 2024-05-07 DIAGNOSIS — Z87891 Personal history of nicotine dependence: Secondary | ICD-10-CM

## 2024-05-07 NOTE — Progress Notes (Signed)
 History of Present Illness Chase Moreno is a 73 y.o. male with a past medical history of prostate cancer on surveillance, hypertension, sleep apnea on CPAP, recent hospitalization for right lower lobe pneumonia. Referred to Pulmonary 04/2024 for further evaluation of lymphadenopathy and  multiple pulmonary nodules concerning for metastatic disease. She was seen by Dr. Zaida, Bronch was done by Dr. Kara.    05/07/2024 Discussed the use of AI scribe software for clinical note transcription with the patient, who gave verbal consent to proceed.  History of Present Illness 73 year old male with a past medical history of prostate cancer on surveillance, hypertension, sleep apnea on CPAP, recent hospitalization for right lower lobe pneumonia in June with a possible parapneumonic effusion that has resolved. Patient had a persistent cough afterwards and workup included getting a CT scan in one of the ERs where he presented with cough and syncope. CT was concerning for multiple pulmonary nodules bilaterally and mediastinal lymphadenopathy.    Chase Moreno is a 73 year old male who presents after navigational bronchoscopy with biopsies and EBUS to better evaluate  persistent cough, pulmonary nodules, and mediastinal lymphadenopathy.  He states he did well after the procedure. Scant  bleeding which has self resolved, No fever, discolored secretions , worsening shortness of breath or adverse reaction to anesthesia.   He has experienced a persistent cough for the past three months, initially associated with weight loss. After treatment for the cough , his appetite returned, and he regained the lost weight. He notes a slight increase in shortness of breath, particularly during physical activities such as walking or working, though it is not significantly severe.  A CT scan of the chest revealed multiple pulmonary nodules and enlarged mediastinal lymph nodes. A biopsy showed no malignant cells in the right  upper lobe nodule and the 4R lymph node. However, the number seven lymph node biopsy was reported as morphologically and immunophenotypically consistent with classical Hodgkin's lymphoma.Pt. has an appointment with Dr. Federico 05/11/2024 to finalize treatment recommendations.   His smoking history includes smoking a pack per week for about ten years, starting at age 20 and quitting approximately 50 years ago.     Test Results: Cytology 04/29/2024 FINAL MICROSCOPIC DIAGNOSIS:  A. LUNG, RUL, FINE NEEDLE ASPIRATION  BIOPSIES:  - No malignant cells identified  B. LYMPH NODE, 4R, FINE NEEDLE ASPIRATION:  -No malignant cells identified   Note: The specimen is extremely paucicellular and has predominantly  benign bronchial epithelium as well as blood and extremely limited to  nonexistent lymphoid tissue for evaluation.   C. LYMPH NODE, STATION 7, FINE NEEDLE ASPIRATION:  -Morphologically and immunophenotypically consistent with classical  Hodgkin's lymphoma.   The cellblock consists of predominantly blood and a scant fragment of lymphoid tissue with atypical large cells with prominent cherry-red nucleoli and focal binucleation morphologically suspicious for Hodgkin/Reed-Sternberg cells.  This is in the background of lymphocytes, plasma cells as well as scattered eosinophils.  The cells of interest are positive for CD30 and weakly coexpress PAX5 and BCL6 and strongly coexpress MUM1.  CD20 is negative as is CD15.  CD45 is difficult to interpret given the abundance of background positive staining but appears negative in the cells of interest.  CD31 highlights background endothelial cells and CK AE1/AE3 is negative.  The overall morphologic and immunophenotypic expression are consistent with classic Hodgkin's lymphoma.      Latest Ref Rng & Units 04/14/2024    1:48 PM 04/09/2024    7:58 PM 03/05/2024   10:50  AM  CBC  WBC 4.0 - 10.5 K/uL 17.4  15.0  10.4   Hemoglobin 13.0 - 17.0 g/dL 88.1   88.6  87.3   Hematocrit 39.0 - 52.0 % 37.4  36.8  38.7   Platelets 150 - 400 K/uL 478  467  345.0        Latest Ref Rng & Units 04/14/2024    1:48 PM 04/09/2024    7:58 PM 03/05/2024   10:50 AM  BMP  Glucose 70 - 99 mg/dL 892  96  97   BUN 8 - 23 mg/dL 22  12  14    Creatinine 0.61 - 1.24 mg/dL 8.93  8.80  8.87   Sodium 135 - 145 mmol/L 139  140  141   Potassium 3.5 - 5.1 mmol/L 4.2  4.2  4.1   Chloride 98 - 111 mmol/L 106  103  106   CO2 22 - 32 mmol/L 27  26  27    Calcium  8.9 - 10.3 mg/dL 8.7  9.5  9.1     BNP No results found for: BNP  ProBNP No results found for: PROBNP  PFT No results found for: FEV1PRE, FEV1POST, FVCPRE, FVCPOST, TLC, DLCOUNC, PREFEV1FVCRT, PSTFEV1FVCRT  DG SWALLOW FUNC OP MEDICARE SPEECH PATH Result Date: 05/06/2024 MODIFIED BARIUM SWALLOW STUDY LATE ENTRY FROM 05/04/24 History of Present Illness Chase Moreno is a 73 y.o. male who was referred by [facility] for an OP MBS. PMHx prostate cancer with lung nodules, recent bronchoscopy, DM, sleep apnea, recurrent pna.  Reports dysphagia with choking at least twice per week.  Improvements noted since he started on prednisone .   Clinical Impression Pt presents with normal oropharyngeal swallow with no aspiration.  There was adequate oral control and mastication.  Onset of the pharyngeal swallow was timely with normal mobility of the laryngeal complex.  There was sufficient contraction of the pharynx and normal propulsion through the UES.  There was one incident of transient penetration (PAS 2) of thin liquids when swallowing the pill, considered WFL. Prominent Cricopharyngeus. No dysphagia.  Continue current diet, thin liquids.   Factors that may increase risk of adverse event in presence of aspiration Noe & Lianne 2021):  none  Swallow Evaluation Recommendations Recommendations: PO diet PO Diet Recommendation: Regular;Thin liquids (Level 0) Liquid Administration via: Cup;Straw Medication  Administration: Whole meds with liquid Supervision: Patient able to self-feed Oral care recommendations: Oral care BID (2x/day)    Amanda L. Vona, MA CCC/SLP Clinical Specialist - Acute Care SLP Acute Rehabilitation Services Office number [phone] CLINICAL DATA:  MBS requested by pulmonology as part of ongoing workup of cough and dysphagia. EXAM: MODIFIED BARIUM SWALLOW TECHNIQUE: Radiologist, not in attendance for the exam. Different consistencies of barium were administered orally to the patient by the Speech Pathologist. Imaging of the pharynx was performed in the lateral projection. The radiology APP, Laymon Coast, NP was present in the fluoroscopy room for this study, providing personal supervision. FLUOROSCOPY: Radiation Exposure Index (as provided by the fluoroscopic device): 51.28 mGy Kerma COMPARISON:  None Available. FINDINGS: Vestibular  Penetration:  None seen. Aspiration:  None seen. Other: Prominent cricopharyngeal bar without associated diverticulum. IMPRESSION: No evidence of penetration or aspiration or any other finding to explain symptoms. Please refer to the Speech Pathologists report for complete details and recommendations. Electronically Signed   By: Cordella Banner   On: 05/04/2024 14:48  DG Chest Port 1 View Result Date: 04/29/2024 CLINICAL DATA:  [id number] S/P bronchoscopy with biopsy [id number] EXAM: PORTABLE CHEST -  1 VIEW COMPARISON:  04/29/2024, 02/20/2024 FINDINGS: Low lung volumes. Bilateral perihilar interstitial opacities. Patchy nodular opacities in both mid and lower lung zones. No pneumothorax. No pleural effusion. Mild cardiomegaly. Tortuous aorta. No acute fracture or destructive lesions. Multilevel thoracic osteophytosis. IMPRESSION: 1. Low lung volumes with bilateral perihilar interstitial opacities, which may represent bronchovascular crowding or mild interstitial edema. No pneumothorax. 2. Similar patchy, nodular airspace opacities in both mid and lower lung  zones, better visualized on the comparison chest CT. Electronically Signed   By: Rogelia Myers M.D.   On: 04/29/2024 12:23   DG C-ARM BRONCHOSCOPY Result Date: 04/29/2024 C-ARM BRONCHOSCOPY: Fluoroscopy was utilized by the requesting physician.  No radiographic interpretation.   DG C-Arm 1-60 Min-No Report Result Date: 04/29/2024 Fluoroscopy was utilized by the requesting physician.  No radiographic interpretation.   CT SUPER D CHEST WO CONTRAST Result Date: 04/29/2024 CLINICAL DATA:  Preoperative assessment for bronchoscopy. EXAM: CT CHEST WITHOUT CONTRAST TECHNIQUE: Multidetector CT imaging of the chest was performed using thin slice collimation for electromagnetic bronchoscopy planning purposes, without intravenous contrast. RADIATION DOSE REDUCTION: This exam was performed according to the departmental dose-optimization program which includes automated exposure control, adjustment of the mA and/or kV according to patient size and/or use of iterative reconstruction technique. COMPARISON:  PET-CT 04/22/2024.  Chest CT 04/09/2024. FINDINGS: Cardiovascular: The heart size is normal. No substantial pericardial effusion. Mild atherosclerotic calcification is noted in the wall of the thoracic aorta. Mediastinum/Nodes: Similar mediastinal and right hilar lymphadenopathy as characterized on recent PET-CT. There is no axillary lymphadenopathy. Lungs/Pleura: Basilar predominant bilateral pulmonary nodules as characterized on recent PET-CT. Index 8 mm anterior right lung nodule identified on chest CT 04/09/2024 is 8 mm again today on image 76/4. Posterior right lower lobe index nodule measured previously at 17 mm is 17 mm again today on 96/4. Nodular opacity in the medial right middle lobe on 94/4 appears progressive in the interval and may have an associated component of atelectasis on the current study. No substantial pleural effusion. Upper Abdomen: Hypodensity seen adjacent to the gallbladder on the lowest  image (147/3) is probably volume averaging with the exophytic right renal cyst as seen on recent PET-CT. Perigastric ill-defined nodularity on 132/3 is associated with a amorphous soft tissue in the gastrohepatic ligament and upper normal gastrohepatic ligament lymph nodes. Musculoskeletal: Scattered small hypermetabolic bone lesions seen on previous PET-CT not readily evident by today's study. IMPRESSION: 1. Basilar predominant bilateral pulmonary nodules as characterized on recent PET-CT. 2. Nodular opacity in the medial right middle lobe appears progressive in the interval and may have an associated component of atelectasis on the current study. 3. Similar mediastinal and right hilar lymphadenopathy as characterized on recent PET-CT. 4. Perigastric ill-defined nodularity is associated with a amorphous soft tissue in the gastrohepatic ligament and upper normal gastrohepatic ligament lymph nodes. 5. Scattered small hypermetabolic bone lesions seen on previous PET-CT not readily evident by today's study. 6.  Aortic Atherosclerosis (ICD10-I70.0). Electronically Signed   By: Camellia Candle M.D.   On: 04/29/2024 07:51   NM PET Image Initial (PI) Skull Base To Thigh Result Date: 04/22/2024 CLINICAL DATA:  Initial treatment strategy for pulmonary nodules and mediastinal/hilar adenopathy on recent chest CT. EXAM: NUCLEAR MEDICINE PET SKULL BASE TO THIGH TECHNIQUE: 10.06 mCi F-18 FDG was injected intravenously. Full-ring PET imaging was performed from the skull base to thigh after the radiotracer. CT data was obtained and used for attenuation correction and anatomic localization. Fasting blood glucose: 90 mg/dl COMPARISON:  Chest CT 04/09/2024 FINDINGS: Mediastinal blood pool activity: SUV max 2.2 Liver activity: SUV max NA NECK: No hypermetabolic lymph nodes in the neck. Incidental CT findings: None. CHEST: Enlarged mediastinal and hilar lymph nodes demonstrate low level hypermetabolism. Right-sided precarinal node has  an SUV max of 3.3. Subcarinal node has an SUV max of 3.2. Left hilar node has an SUV max of 3.4. Left-sided supraclavicular nodes are small but are hypermetabolic both with SUV max of 4.4. Several pulmonary nodules as demonstrated on the CT scan. 7 mm right upper lobe nodule has an SUV max of 2.5. 8 mm right lower lobe pulmonary nodule has an SUV max of 2.2. 10 mm right middle lobe nodule has an SUV max of 3.5. Incidental CT findings: Stable pulmonary scarring. ABDOMEN/PELVIS: No abnormal hypermetabolic activity within the liver, pancreas, adrenal glands, or spleen. 13 mm left para-aortic node on image 91/2 2 demonstrates mild hypermetabolism with SUV max of 2.7. 11 mm left external iliac node on image 123/202 has an SUV max of 2.7. Hypermetabolic focus in the lower anus area has an SUV max of 5.5 and is likely due to hemorrhoids. Incidental CT findings: Simple right renal cysts. Status post prostatectomy. SKELETON: Small scattered hypermetabolic bone lesions. T9 vertebral body region has an SUV max of 3.2. Left sacral lesion has an SUV max of 3.0. Left iliac bone lesion has an SUV max of 3.7. Lesion in the right acetabulum has SUV max of 3.9. Small hypermetabolic intramuscular lesions are noted in the left psoas muscle the most superior lesion has an SUV max of 3.7 in the more inferior lesions have an SUV max of 4.6. Incidental CT findings: None. IMPRESSION: 1. Hypermetabolic lymphadenopathy, pulmonary nodules and bone lesions consistent with metastatic disease. Findings likely due to metastatic prostate cancer given patient's history. Recommend correlation with PSA level. The left supraclavicular nodes may be the most amenable to biopsy. 2. Hypermetabolic focus in the lower anus area is likely due to hemorrhoids. Electronically Signed   By: MYRTIS Stammer M.D.   On: 04/22/2024 23:22   CT Chest W Contrast Result Date: 04/09/2024 EXAM: CT CHEST WITH CONTRAST 04/09/2024 09:29:47 PM TECHNIQUE: CT of the chest was  performed with the administration of 75 mL of iohexol  (OMNIPAQUE ) 300 MG/ML solution. Multiplanar reformatted images are provided for review. Automated exposure control, iterative reconstruction, and/or weight based adjustment of the mA/kV was utilized to reduce the radiation dose to as low as reasonably achievable. COMPARISON: Chest radiograph progress 02/13/2024. CLINICAL HISTORY: Abnormal x-ray - lung nodule, >= 1 cm. Shortness of breath, abnormal x-ray, lung nodule. FINDINGS: MEDIASTINUM: Heart and pericardium are unremarkable. The central airways are clear. LYMPH NODES: Numerous enlarged mediastinal and bilateral hilar lymph nodes. For example, a 2.7 x 3.9 cm right hilar node on series 2 image 29, a 2 x 1 cm pretracheal node on series 2 image 23, and a 1.1 cm left hilar node on series 2 image 32. No axillary lymphadenopathy. LUNGS AND PLEURA: Multiple bilateral pulmonary nodules. For example, in the right upper lobe on series 4 image 73 measuring 8 mm; in the right lower lobe measuring 1.7 x 1.2 cm (series 4 image 102); and in the medial left lower lobe measuring 8 mm (series 4 image 125). No focal consolidation or pulmonary edema. No pleural effusion or pneumothorax. SOFT TISSUES/BONES: No acute abnormality of the bones or soft tissues. No destructive osseous lesion. UPPER ABDOMEN: Limited images of the upper abdomen demonstrates no acute abnormality. IMPRESSION: 1. Multiple bilateral  pulmonary nodules, including a 1.7 x 1.2 cm right lower lobe nodule. Numerous enlarged mediastinal and bilateral hilar lymph nodes. Findings concerning for metastatic cancer of unknown primary. Electronically signed by: Norman Gatlin MD 04/09/2024 09:50 PM EDT RP Workstation: [id number]     Past medical hx Past Medical History:  Diagnosis Date   Allergic rhinitis, cause unspecified    Cancer (HCC)    prostate cancer 2018   Diabetes mellitus without complication (HCC)    patient denies although dx in 2017 with a Dr  Avelina ; last hgA1c was however 5.5    Unspecified essential hypertension    Unspecified sleep apnea    CPAP machine use      Social History   Tobacco Use   Smoking status: Former   Smokeless tobacco: Never   Tobacco comments:    quit over 40 years ago    Smoked for about 8-10 years, about 1 pack per week.  Vaping Use   Vaping status: Never Used  Substance Use Topics   Alcohol use: No    Alcohol/week: 0.0 standard drinks of alcohol   Drug use: No    Mr.Hentz reports that he has quit smoking. He has never used smokeless tobacco. He reports that he does not drink alcohol and does not use drugs.  Tobacco Cessation: Counseling given: Not Answered Tobacco comments: quit over 40 years ago Smoked for about 8-10 years, about 1 pack per week. Former remote smoker  Past surgical hx, Family hx, Social hx all reviewed.  Current Outpatient Medications on File Prior to Visit  Medication Sig   aspirin  EC 81 MG tablet Take 81 mg by mouth daily. Swallow whole.   atorvastatin  (LIPITOR) 10 MG tablet Take 1 tablet (10 mg total) by mouth daily.   cholecalciferol (VITAMIN D3) 25 MCG (1000 UNIT) tablet Take 1,000 Units by mouth daily.   losartan  (COZAAR ) 50 MG tablet Take 1 tablet (50 mg total) by mouth daily.   naproxen sodium (ALEVE) 220 MG tablet Take 220 mg by mouth daily as needed (for pain).   zinc sulfate, 50mg  elemental zinc, 220 (50 Zn) MG capsule Take 220 mg by mouth daily.   fluticasone-salmeterol (ADVAIR DISKUS) 500-50 MCG/ACT AEPB Inhale 1 puff into the lungs in the morning and at bedtime. (Patient not taking: Reported on 05/07/2024)   No current facility-administered medications on file prior to visit.     No Known Allergies  Review Of Systems:  Constitutional:   + initial  No  weight loss, night sweats,  Fevers, chills, fatigue, or  lassitude.  HEENT:   No headaches,  Difficulty swallowing,  Tooth/dental problems, or  Sore throat,                No sneezing, itching, ear  ache, nasal congestion, post nasal drip,   CV:  No chest pain,  Orthopnea, PND, swelling in lower extremities, anasarca, dizziness, palpitations, syncope.   GI  No heartburn, indigestion, abdominal pain, nausea, vomiting, diarrhea, change in bowel habits, loss of appetite, bloody stools.   Resp: + shortness of breath with exertion less at rest.  No excess mucus, no productive cough,  No non-productive cough,  No coughing up of blood.  No change in color of mucus.  No wheezing.  No chest wall deformity  Skin: no rash or lesions.  GU: no dysuria, change in color of urine, no urgency or frequency.  No flank pain, no hematuria   MS:  No joint pain or swelling.  No  decreased range of motion.  No back pain.  Psych:  No change in mood or affect. No depression or anxiety.  No memory loss.   Vital Signs BP (!) 152/89   Pulse 77   Temp 97.7 F (36.5 C) (Oral)   Ht 5' 7 (1.702 m)   Wt 195 lb 12.8 oz (88.8 kg)   SpO2 97%   BMI 30.67 kg/m     Physical Exam GENERAL: No distress, alert and oriented times 3. EARS NOSE THROAT: No sinus tenderness, tympanic membranes clear, pale nasal mucosa, no oral exudate, no post nasal drip, no lymphadenopathy. CHEST: Lungs clear to auscultation bilaterally. No wheeze, rales, dullness, no accessory muscle use, no nasal flaring, no sternal retractions. CARDIAC: S1, S2, regular rate and rhythm, no murmur. ABDOMINAL: Soft, non tender. ND, BS present. EXTREMITIES: No clubbing, cyanosis, edema. No obvious deformities. NEUROLOGICAL: Normal strength. Alert and oriented x 3, MAE x 4. SKIN: No rashes, warm and dry. No obvious skin lesions. PSYCHIATRIC: Normal mood and behavior. Asking appropriate questions    Assessment/Plan Assessment & Plan Post navigational bronchoscopy with biopsies and EBUS Plan I am glad you have done well after the procedure. We have reviewed your biopsy results together. They were positive for Hodgkins Lymphoma.  You have an  appointment with Dr. Federico 05/11/2024 to finalize treatment recommendations . You will get great care at [facility]. Metta Sours with treatment. Call if you need us  for anything. Please contact office for sooner follow up if symptoms do not improve or worsen or seek emergency care      I spent 20 minutes dedicated to the care of this patient on the date of this encounter to include pre-visit review of records, face-to-face time with the patient discussing conditions above, post visit ordering of testing, clinical documentation with the electronic health record, making appropriate referrals as documented, and communicating necessary information to the patient's healthcare team.    Lauraine JULIANNA Lites, NP 05/07/2024  9:34 AM

## 2024-05-07 NOTE — Patient Instructions (Addendum)
 It is good to see you today. I am glad you have done well after the procedure. We have reviewed your biopsy results together. They were positive for Hodgkins Lymphoma.  You have an appointment with Dr. Federico 05/11/2024 to finalize treatment recommendations . You will get great care at [facility]. Metta Sours with treatment. Call if you need us  for anything. Please contact office for sooner follow up if symptoms do not improve or worsen or seek emergency care

## 2024-05-11 ENCOUNTER — Inpatient Hospital Stay: Payer: Medicare (Managed Care) | Admitting: Hematology and Oncology

## 2024-05-11 ENCOUNTER — Inpatient Hospital Stay: Payer: Medicare (Managed Care)

## 2024-05-11 VITALS — BP 138/90 | HR 82 | Temp 98.3°F | Resp 15 | Wt 194.6 lb

## 2024-05-11 DIAGNOSIS — R053 Chronic cough: Secondary | ICD-10-CM

## 2024-05-11 DIAGNOSIS — R599 Enlarged lymph nodes, unspecified: Secondary | ICD-10-CM

## 2024-05-11 DIAGNOSIS — C8178 Other classical Hodgkin lymphoma, lymph nodes of multiple sites: Secondary | ICD-10-CM

## 2024-05-11 DIAGNOSIS — R590 Localized enlarged lymph nodes: Secondary | ICD-10-CM | POA: Diagnosis not present

## 2024-05-11 LAB — CBC WITH DIFFERENTIAL (CANCER CENTER ONLY)
Abs Immature Granulocytes: 0.1 K/uL — ABNORMAL HIGH (ref 0.00–0.07)
Basophils Absolute: 0.1 K/uL (ref 0.0–0.1)
Basophils Relative: 0 %
Eosinophils Absolute: 0.2 K/uL (ref 0.0–0.5)
Eosinophils Relative: 1 %
HCT: 37 % — ABNORMAL LOW (ref 39.0–52.0)
Hemoglobin: 11.6 g/dL — ABNORMAL LOW (ref 13.0–17.0)
Immature Granulocytes: 1 %
Lymphocytes Relative: 11 %
Lymphs Abs: 1.6 K/uL (ref 0.7–4.0)
MCH: 26 pg (ref 26.0–34.0)
MCHC: 31.4 g/dL (ref 30.0–36.0)
MCV: 82.8 fL (ref 80.0–100.0)
Monocytes Absolute: 1.4 K/uL — ABNORMAL HIGH (ref 0.1–1.0)
Monocytes Relative: 10 %
Neutro Abs: 11 K/uL — ABNORMAL HIGH (ref 1.7–7.7)
Neutrophils Relative %: 77 %
Platelet Count: 451 K/uL — ABNORMAL HIGH (ref 150–400)
RBC: 4.47 MIL/uL (ref 4.22–5.81)
RDW: 17.2 % — ABNORMAL HIGH (ref 11.5–15.5)
WBC Count: 14.3 K/uL — ABNORMAL HIGH (ref 4.0–10.5)
nRBC: 0 % (ref 0.0–0.2)

## 2024-05-11 LAB — CMP (CANCER CENTER ONLY)
ALT: 10 U/L (ref 0–44)
AST: 15 U/L (ref 15–41)
Albumin: 3.8 g/dL (ref 3.5–5.0)
Alkaline Phosphatase: 87 U/L (ref 38–126)
Anion gap: 5 (ref 5–15)
BUN: 9 mg/dL (ref 8–23)
CO2: 30 mmol/L (ref 22–32)
Calcium: 9 mg/dL (ref 8.9–10.3)
Chloride: 106 mmol/L (ref 98–111)
Creatinine: 1.06 mg/dL (ref 0.61–1.24)
GFR, Estimated: >60 mL/min (ref >60)
Glucose, Bld: 79 mg/dL (ref 70–99)
Potassium: 4 mmol/L (ref 3.5–5.1)
Sodium: 141 mmol/L (ref 135–145)
Total Bilirubin: 0.3 mg/dL (ref 0.0–1.2)
Total Protein: 8 g/dL (ref 6.5–8.1)

## 2024-05-11 LAB — SEDIMENTATION RATE: Sed Rate: 107 mm/h — ABNORMAL HIGH (ref 0–16)

## 2024-05-11 LAB — URIC ACID: Uric Acid, Serum: 5.1 mg/dL (ref 3.7–8.6)

## 2024-05-11 LAB — LACTATE DEHYDROGENASE: LDH: 170 U/L (ref 98–192)

## 2024-05-11 MED ORDER — ONDANSETRON HCL 8 MG PO TABS
8.0000 mg | ORAL_TABLET | Freq: Three times a day (TID) | ORAL | 0 refills | Status: AC | PRN
Start: 2024-05-11 — End: ?

## 2024-05-11 MED ORDER — ALLOPURINOL 300 MG PO TABS: 300.0000 mg | ORAL_TABLET | Freq: Every day | ORAL | 1 refills | Status: AC

## 2024-05-11 MED ORDER — LIDOCAINE-PRILOCAINE 2.5-2.5 % EX CREA: 1.0000 | TOPICAL_CREAM | CUTANEOUS | 0 refills | Status: AC | PRN

## 2024-05-11 MED ORDER — PROCHLORPERAZINE MALEATE 10 MG PO TABS: 10.0000 mg | ORAL_TABLET | Freq: Four times a day (QID) | ORAL | 0 refills | Status: AC | PRN

## 2024-05-11 NOTE — Patient Instructions (Signed)
 Thank you for choosing [facility] to provide your care. If you have questions after your visit to [facility]), please contact this office at [phone] between 8:00 am and 4 pm and select the name of your provider. Voicemails left after 4:00 may not be returned until the next business day. Calls received after 4:30 pm will be answered by an off-site nursing triage line.    Prescription Refills: Please ask your pharmacy to contact us  directly for most prescription requests. Please contact the office directly to refill narcotics (pain medications). Allow 48-72 hours for refills.  Appointments: Please contact the [facility] scheduling department at [phone] if you have questions about [facility] appointment scheduling. Please contact the schedulers with any scheduling changes so your appointment can be rescheduled in a timely manner.  Central Radiology Scheduling for [facility] [phone]  Call to schedule procedures such as PET scans, CT scans, MRIs, ultrasound, etc.  To allow each patient quality time with our providers, please arrive 15-30 minutes before your scheduled appointment time to allow for registration.  If you are late for your appointment, you may be asked to reschedule. We strive to provide you with quality time with our providers, and arriving late affects you and other patients whose appointments are after yours. If you do not show up for multiple scheduled visits, you may be dismissed from the clinic at the provider's discretion.  Resources: [facility] Social Workers [phone] for additional information on assistance programs or assistance connecting with community support programs [place] DSS [phone]: Information about food stamps, Medicaid, and utility assistance [employer] (534)003-2762: [employer] ride-sharing service for eligible riders who have a disability that prevents them from riding the fixed-route  bus. Medicare Rights Center ([phone] 800-9836: Helps people with Medicare understand their rights and benefits, navigate the Medicare system, and get the quality health care they deserve [employer] (737)009-4183: Assists patients to locate various types of support and financial assistance [facility] 1-800-813-HOPE 407 568 9681): Provides financial assistance, online support groups, medication/co-pay assistance. Transportation Assistance for [employer]: Please inform the provider's scheduler or support staff to refer you to [employer].  Again, thank you for choosing [facility] for your care

## 2024-05-11 NOTE — Progress Notes (Signed)
 [facility] [phone]   [phone]  INITIAL CONSULT NOTE  Patient Care Team: Avelina Greig BRAVO, MD as PCP - General Renda Glance, MD as Consulting Physician (Urology) Nieves Cough, MD as Consulting Physician (Urology) Parrett, Madelin RAMAN, NP as Nurse Practitioner (Pulmonary Disease) Elana Montie CROME, RN as Oncology Nurse Navigator  Hematological/Oncological History # Hodgkin's Lymphoma, Stage IV  04/14/2024: establish care with [facility] clinic 04/22/2024: PET CT scan showed hypermetabolic lymphadenopathy, pulmonary nodules and bone lesions consistent with metastatic disease 04/29/2024: bronchoscopy with lymph node biopsy showed cells morphologically and immunophenotypically consistent with classical  Hodgkin's lymphoma.  05/11/2024: establish care with Dr. Federico   CHIEF COMPLAINTS/PURPOSE OF CONSULTATION:  Hodgkins Lymphoma   HISTORY OF PRESENTING ILLNESS:  Chase Moreno 73 y.o. male with medical history significant for sleep apnea with CPAP machine, type 2 diabetes, hypertension, prostate cancer, and allergic rhinitis who presents for evaluation of newly diagnosed Hodgkin's lymphoma.    On exam today Mr. Samford is accompanied by his wife.  He reports he has been well overall in interim since her last visit.  He notes that he is aware of the results of his biopsy and came today to discuss the next steps.  He denies any fevers, chills, sweats, nausea, vomiting or diarrhea.  Overall he feels well.  No full 10 point ROS otherwise negative.  The bulk of our discussion focused on the diagnosis of Hodgkin's lymphoma this test moving forward.  Details of our conversation are noted below.  MEDICAL HISTORY:  Past Medical History:  Diagnosis Date   Allergic rhinitis, cause unspecified    Cancer (HCC)    prostate cancer 2018   Diabetes mellitus without complication (HCC)    patient denies although dx in 2017 with a Dr Avelina ; last hgA1c was however 5.5     Unspecified essential hypertension    Unspecified sleep apnea    CPAP machine use     SURGICAL HISTORY: Past Surgical History:  Procedure Laterality Date   arm surgery Left    ENDOBRONCHIAL ULTRASOUND N/A 04/29/2024   Procedure: ENDOBRONCHIAL ULTRASOUND (EBUS);  Surgeon: Kara Dorn NOVAK, MD;  Location: [facility] ENDOSCOPY;  Service: Pulmonary;  Laterality: N/A;   LYMPHADENECTOMY Bilateral 02/17/2017   Procedure: LYMPHADENECTOMY;  Surgeon: Renda Glance, MD;  Location: [facility] ORS;  Service: Urology;  Laterality: Bilateral;   ROBOT ASSISTED LAPAROSCOPIC RADICAL PROSTATECTOMY N/A 02/17/2017   Procedure: XI ROBOTIC ASSISTED LAPAROSCOPIC RADICAL PROSTATECTOMY LEVEL 2;  Surgeon: Renda Glance, MD;  Location: [facility] ORS;  Service: Urology;  Laterality: N/A;   TONSILLECTOMY     VIDEO BRONCHOSCOPY WITH ENDOBRONCHIAL NAVIGATION N/A 04/29/2024   Procedure: VIDEO BRONCHOSCOPY WITH ENDOBRONCHIAL NAVIGATION;  Surgeon: Kara Dorn NOVAK, MD;  Location: [facility] ENDOSCOPY;  Service: Pulmonary;  Laterality: N/A;    SOCIAL HISTORY: Social History   Socioeconomic History   Marital status: Married    Spouse name: Not on file   Number of children: 2   Years of education: Not on file   Highest education level: Not on file  Occupational History   Occupation: machine shop    Comment: [employer]  Tobacco Use   Smoking status: Former   Smokeless tobacco: Never   Tobacco comments:    quit over 40 years ago    Smoked for about 8-10 years, about 1 pack per week.  Vaping Use   Vaping status: Never Used  Substance and Sexual Activity   Alcohol use: No    Alcohol/week: 0.0 standard drinks of alcohol  Drug use: No   Sexual activity: Not on file  Other Topics Concern   Not on file  Social History Narrative   Regular exercise---yes, rabbit hunting, walking 2 times  A week.      Diet---eating fruit and veggies, limiting meat.      Widowed: Remarried.         Social Drivers of Research Scientist (physical Sciences) Strain: Low Risk  (03/04/2024)   Overall Financial Resource Strain (CARDIA)    Difficulty of Paying Living Expenses: Not hard at all  Food Insecurity: No Food Insecurity (05/11/2024)   Hunger Vital Sign    Worried About Running Out of Food in the Last Year: Never true    Ran Out of Food in the Last Year: Never true  Transportation Needs: No Transportation Needs (05/11/2024)   PRAPARE - Administrator, Civil Service (Medical): No    Lack of Transportation (Non-Medical): No  Physical Activity: Sufficiently Active (03/04/2024)   Exercise Vital Sign    Days of Exercise per Week: 5 days    Minutes of Exercise per Session: 30 min  Stress: No Stress Concern Present (03/04/2024)   [employer] of Occupational Health - Occupational Stress Questionnaire    Feeling of Stress: Not at all  Social Connections: Moderately Integrated (03/04/2024)   Social Connection and Isolation Panel    Frequency of Communication with Friends and Family: More than three times a week    Frequency of Social Gatherings with Friends and Family: More than three times a week    Attends Religious Services: More than 4 times per year    Active Member of [employer] or Organizations: No    Attends Banker Meetings: Never    Marital Status: Married  Catering Manager Violence: Not At Risk (05/11/2024)   Humiliation, Afraid, Rape, and Kick questionnaire    Fear of Current or Ex-Partner: No    Emotionally Abused: No    Physically Abused: No    Sexually Abused: No    FAMILY HISTORY: Family History  Problem Relation Age of Onset   Arthritis Mother    Arrhythmia Mother    Stroke Paternal Grandmother     ALLERGIES:  has no known allergies.  MEDICATIONS:  Current Outpatient Medications  Medication Sig Dispense Refill   allopurinol (ZYLOPRIM) 300 MG tablet Take 1 tablet (300 mg total) by mouth daily. 90 tablet 1   aspirin  EC 81 MG tablet Take 81 mg by mouth daily. Swallow whole.      atorvastatin  (LIPITOR) 10 MG tablet Take 1 tablet (10 mg total) by mouth daily. 90 tablet 3   cholecalciferol (VITAMIN D3) 25 MCG (1000 UNIT) tablet Take 1,000 Units by mouth daily.     lidocaine -prilocaine (EMLA) cream Apply 1 Application topically as needed. 30 g 0   losartan  (COZAAR ) 50 MG tablet Take 1 tablet (50 mg total) by mouth daily. 90 tablet 3   naproxen sodium (ALEVE) 220 MG tablet Take 220 mg by mouth daily as needed (for pain).     ondansetron  (ZOFRAN ) 8 MG tablet Take 1 tablet (8 mg total) by mouth every 8 (eight) hours as needed. 30 tablet 0   prochlorperazine (COMPAZINE) 10 MG tablet Take 1 tablet (10 mg total) by mouth every 6 (six) hours as needed for nausea or vomiting. 30 tablet 0   zinc sulfate, 50mg  elemental zinc, 220 (50 Zn) MG capsule Take 220 mg by mouth daily.     fluticasone-salmeterol (ADVAIR DISKUS) 500-50  MCG/ACT AEPB Inhale 1 puff into the lungs in the morning and at bedtime. (Patient not taking: Reported on 05/11/2024) 60 each 6   No current facility-administered medications for this visit.    REVIEW OF SYSTEMS:   Constitutional: ( - ) fevers, ( - )  chills , ( - ) night sweats Eyes: ( - ) blurriness of vision, ( - ) double vision, ( - ) watery eyes Ears, nose, mouth, throat, and face: ( - ) mucositis, ( - ) sore throat Respiratory: ( - ) cough, ( - ) dyspnea, ( - ) wheezes Cardiovascular: ( - ) palpitation, ( - ) chest discomfort, ( - ) lower extremity swelling Gastrointestinal:  ( - ) nausea, ( - ) heartburn, ( - ) change in bowel habits Skin: ( - ) abnormal skin rashes Lymphatics: ( - ) new lymphadenopathy, ( - ) easy bruising Neurological: ( - ) numbness, ( - ) tingling, ( - ) new weaknesses Behavioral/Psych: ( - ) mood change, ( - ) new changes  All other systems were reviewed with the patient and are negative.  PHYSICAL EXAMINATION:  Vitals:   05/11/24 1059  BP: (!) 138/90  Pulse: 82  Resp: 15  Temp: 98.3 F (36.8 C)  SpO2: 99%   Filed  Weights   05/11/24 1059  Weight: 194 lb 9.6 oz (88.3 kg)    GENERAL: well appearing elderly African-American male in NAD  SKIN: skin color, texture, turgor are normal, no rashes or significant lesions EYES: conjunctiva are pink and non-injected, sclera clear LUNGS: clear to auscultation and percussion with normal breathing effort HEART: regular rate & rhythm and no murmurs and no lower extremity edema Musculoskeletal: no cyanosis of digits and no clubbing  PSYCH: alert & oriented x 3, fluent speech NEURO: no focal motor/sensory deficits  LABORATORY DATA:  I have reviewed the data as listed    Latest Ref Rng & Units 05/11/2024   12:05 PM 04/14/2024    1:48 PM 04/09/2024    7:58 PM  CBC  WBC 4.0 - 10.5 K/uL 14.3  17.4  15.0   Hemoglobin 13.0 - 17.0 g/dL 88.3  88.1  88.6   Hematocrit 39.0 - 52.0 % 37.0  37.4  36.8   Platelets 150 - 400 K/uL 451  478  467        Latest Ref Rng & Units 05/11/2024   12:05 PM 04/14/2024    1:48 PM 04/09/2024    7:58 PM  CMP  Glucose 70 - 99 mg/dL 79  892  96   BUN 8 - 23 mg/dL 9  22  12    Creatinine 0.61 - 1.24 mg/dL 8.93  8.93  8.80   Sodium 135 - 145 mmol/L 141  139  140   Potassium 3.5 - 5.1 mmol/L 4.0  4.2  4.2   Chloride 98 - 111 mmol/L 106  106  103   CO2 22 - 32 mmol/L 30  27  26    Calcium  8.9 - 10.3 mg/dL 9.0  8.7  9.5   Total Protein 6.5 - 8.1 g/dL 8.0  8.0    Total Bilirubin 0.0 - 1.2 mg/dL 0.3  0.3    Alkaline Phos 38 - 126 U/L 87  80    AST 15 - 41 U/L 15  19    ALT 0 - 44 U/L 10  26       ASSESSMENT & PLAN Beryl Elbe 73 y.o. male with medical history significant for sleep apnea  with CPAP machine, type 2 diabetes, hypertension, prostate cancer, and allergic rhinitis who presents for evaluation of newly diagnosed Hodgkin's lymphoma.    After review of the labs, review of the records, and discussion with the patient the patients findings are most consistent with a new diagnosis of Hodgkin's lymphoma stage IV.  Today we discussed  the diagnosis, prognosis, and treatment moving forward.  He voices understanding of our findings and plan moving forward.  # Hodgkin's Lymphoma, Stage IV  -- Diagnosis confirmed with lymph node biopsy via bronchoscopy as well as PET CT scan. -- Treatment of choice for stage IV Hodgkin's lymphoma would be Nivo-AVD chemotherapy. -- Labs at each visit to include CBC, CMP, LDH, and uric acid. -- Return to clinic for the start of treatment approximately 2 weeks time.  #Supportive Care -- chemotherapy education to be scheduled  -- port placement to be scheduled.  --Echocardiogram to be scheduled -- zofran  8mg  q8H PRN and compazine 10mg  PO q6H for nausea -- acyclovir 400mg  PO BID for VCZ prophylaxis -- allopurinol 300mg  PO daily for TLS prophylaxis -- EMLA cream for port -- no pain medication required at this time.    Orders Placed This Encounter  Procedures   IR IMAGING GUIDED PORT INSERTION    Standing Status:   Future    Expiration Date:   05/11/2025    Reason for Exam (SYMPTOM  OR DIAGNOSIS REQUIRED):   new Hodgkin's lymphoma diagnosis, requesting port for treatment    Preferred Imaging Location?:   [facility]   CBC with Differential (Cancer Center Only)    Standing Status:   Future    Number of Occurrences:   1    Expiration Date:   05/11/2025   CMP (Cancer Center only)    Standing Status:   Future    Number of Occurrences:   1    Expiration Date:   05/11/2025   Lactate dehydrogenase (LDH)    Standing Status:   Future    Number of Occurrences:   1    Expiration Date:   05/11/2025   Sedimentation rate    Standing Status:   Future    Number of Occurrences:   1    Expiration Date:   05/11/2025   Uric acid    Standing Status:   Future    Number of Occurrences:   1    Expiration Date:   05/11/2025   ECHOCARDIOGRAM COMPLETE    Standing Status:   Future    Expected Date:   05/18/2024    Expiration Date:   05/11/2025    Where should this test be performed:   [facility]    Perflutren DEFINITY (image enhancing agent) should be administered unless hypersensitivity or allergy exist:   Administer Perflutren    Reason for exam-Echo:   Chemo  Z09    All questions were answered. The patient knows to call the clinic with any problems, questions or concerns.  A total of more than 40 minutes were spent on this encounter with face-to-face time and non-face-to-face time, including preparing to see the patient, ordering tests and/or medications, counseling the patient and coordination of care as outlined above.   Norleen IVAR Kidney, MD Department of Hematology/Oncology [facility] Phone: [phone] Pager: [phone] Email: [email or web address]  05/11/2024 1:44 PM

## 2024-05-12 ENCOUNTER — Ambulatory Visit (HOSPITAL_COMMUNITY)
Admission: RE | Admit: 2024-05-12 | Discharge: 2024-05-12 | Disposition: A | Discharge status: Home / Self Care | Payer: Medicare (Managed Care) | Source: Ambulatory Visit | Attending: Hematology and Oncology | Admitting: Hematology and Oncology

## 2024-05-12 ENCOUNTER — Other Ambulatory Visit: Payer: Self-pay | Admitting: Radiology

## 2024-05-12 DIAGNOSIS — C8178 Other classical Hodgkin lymphoma, lymph nodes of multiple sites: Secondary | ICD-10-CM | POA: Diagnosis not present

## 2024-05-12 DIAGNOSIS — Z79899 Other long term (current) drug therapy: Secondary | ICD-10-CM

## 2024-05-12 DIAGNOSIS — I10 Essential (primary) hypertension: Secondary | ICD-10-CM | POA: Diagnosis not present

## 2024-05-12 DIAGNOSIS — E119 Type 2 diabetes mellitus without complications: Secondary | ICD-10-CM | POA: Insufficient documentation

## 2024-05-12 DIAGNOSIS — Z8616 Personal history of COVID-19: Secondary | ICD-10-CM | POA: Diagnosis not present

## 2024-05-12 DIAGNOSIS — E785 Hyperlipidemia, unspecified: Secondary | ICD-10-CM | POA: Insufficient documentation

## 2024-05-12 DIAGNOSIS — I119 Hypertensive heart disease without heart failure: Secondary | ICD-10-CM | POA: Insufficient documentation

## 2024-05-12 LAB — COMPREHENSIVE METABOLIC PANEL WITH GFR: eGFR: >90

## 2024-05-12 LAB — ECHOCARDIOGRAM COMPLETE
AR max vel: 2.49 cm2
AV Area VTI: 2.72 cm2
AV Area mean vel: 2.49 cm2
AV Mean grad: 7.7 mmHg
AV Peak grad: 17 mmHg
Ao pk vel: 2.06 m/s
Area-P 1/2: 3.6 cm2
S' Lateral: 2.7 cm

## 2024-05-12 LAB — BASIC METABOLIC PANEL WITH GFR: Creatinine: 0.8 (ref 0.6–1.3)

## 2024-05-12 LAB — MICROALBUMIN / CREATININE URINE RATIO: Microalb Creat Ratio: <30

## 2024-05-12 NOTE — Progress Notes (Signed)
*  PRELIMINARY RESULTS* Echocardiogram 2D Echocardiogram has been performed.  Chase Moreno 05/12/2024, 3:54 PM

## 2024-05-12 NOTE — H&P (Signed)
 Chief Complaint: Hodgkin's lymphoma; referred for Port-A-Cath placement to assist with treatment  Referring Provider(s): Dorsey,J  Supervising Physician: Philip Cornet  Patient Status: [facility] - Out-pt  History of Present Illness: Chase Moreno is a 73 y.o. male ex smoker with past medical history significant for sleep apnea with CPAP use, type 2 diabetes, hypertension, prostate cancer 2018 with prostatectomy, allergic rhinitis who presents now with newly diagnosed Hodgkin's lymphoma.  He is scheduled today for Port-A-Cath placement to assist with treatment.  *** Patient is Full Code  Past Medical History:  Diagnosis Date   Allergic rhinitis, cause unspecified    Cancer (HCC)    prostate cancer 2018   Diabetes mellitus without complication [place])    patient denies although dx in 2017 with a Dr Avelina ; last hgA1c was however 5.5    Unspecified essential hypertension    Unspecified sleep apnea    CPAP machine use     Past Surgical History:  Procedure Laterality Date   arm surgery Left    ENDOBRONCHIAL ULTRASOUND N/A 04/29/2024   Procedure: ENDOBRONCHIAL ULTRASOUND (EBUS);  Surgeon: Kara Dorn NOVAK, MD;  Location: [facility] ENDOSCOPY;  Service: Pulmonary;  Laterality: N/A;   LYMPHADENECTOMY Bilateral 02/17/2017   Procedure: LYMPHADENECTOMY;  Surgeon: Renda Glance, MD;  Location: [facility] ORS;  Service: Urology;  Laterality: Bilateral;   ROBOT ASSISTED LAPAROSCOPIC RADICAL PROSTATECTOMY N/A 02/17/2017   Procedure: XI ROBOTIC ASSISTED LAPAROSCOPIC RADICAL PROSTATECTOMY LEVEL 2;  Surgeon: Renda Glance, MD;  Location: [facility] ORS;  Service: Urology;  Laterality: N/A;   TONSILLECTOMY     VIDEO BRONCHOSCOPY WITH ENDOBRONCHIAL NAVIGATION N/A 04/29/2024   Procedure: VIDEO BRONCHOSCOPY WITH ENDOBRONCHIAL NAVIGATION;  Surgeon: Kara Dorn NOVAK, MD;  Location: [facility] ENDOSCOPY;  Service: Pulmonary;  Laterality: N/A;    Allergies: Patient has no known allergies.  Medications: Prior to Admission  medications   Medication Sig Start Date End Date Taking? Authorizing Provider  allopurinol (ZYLOPRIM) 300 MG tablet Take 1 tablet (300 mg total) by mouth daily. 05/11/24   Federico Norleen ONEIDA MADISON, MD  aspirin  EC 81 MG tablet Take 81 mg by mouth daily. Swallow whole.    [provider]  atorvastatin  (LIPITOR) 10 MG tablet Take 1 tablet (10 mg total) by mouth daily. 12/25/23   Bedsole, Amy E, MD  cholecalciferol (VITAMIN D3) 25 MCG (1000 UNIT) tablet Take 1,000 Units by mouth daily.    [provider]  fluticasone-salmeterol (ADVAIR DISKUS) 500-50 MCG/ACT AEPB Inhale 1 puff into the lungs in the morning and at bedtime. Patient not taking: Reported on 05/11/2024 04/16/24   Zaida Zola SAILOR, MD  lidocaine -prilocaine (EMLA) cream Apply 1 Application topically as needed. 05/11/24   Federico Norleen ONEIDA MADISON, MD  losartan  (COZAAR ) 50 MG tablet Take 1 tablet (50 mg total) by mouth daily. 12/25/23   Bedsole, Amy E, MD  naproxen sodium (ALEVE) 220 MG tablet Take 220 mg by mouth daily as needed (for pain).    [provider]  ondansetron  (ZOFRAN ) 8 MG tablet Take 1 tablet (8 mg total) by mouth every 8 (eight) hours as needed. 05/11/24   Dorsey, John T IV, MD  prochlorperazine (COMPAZINE) 10 MG tablet Take 1 tablet (10 mg total) by mouth every 6 (six) hours as needed for nausea or vomiting. 05/11/24   Federico Norleen ONEIDA MADISON, MD  zinc sulfate, 50mg  elemental zinc, 220 (50 Zn) MG capsule Take 220 mg by mouth daily.    [provider]     Family History  Problem Relation  Age of Onset   Arthritis Mother    Arrhythmia Mother    Stroke Paternal Grandmother     Social History   Socioeconomic History   Marital status: Married    Spouse name: Not on file   Number of children: 2   Years of education: Not on file   Highest education level: Not on file  Occupational History   Occupation: machine shop    Comment: [employer]  Tobacco Use   Smoking status: Former   Smokeless tobacco:  Never   Tobacco comments:    quit over 40 years ago    Smoked for about 8-10 years, about 1 pack per week.  Vaping Use   Vaping status: Never Used  Substance and Sexual Activity   Alcohol use: No    Alcohol/week: 0.0 standard drinks of alcohol   Drug use: No   Sexual activity: Not on file  Other Topics Concern   Not on file  Social History Narrative   Regular exercise---yes, rabbit hunting, walking 2 times  A week.      Diet---eating fruit and veggies, limiting meat.      Widowed: Remarried.         Social Drivers of Corporate Investment Banker Strain: Low Risk  (03/04/2024)   Overall Financial Resource Strain (CARDIA)    Difficulty of Paying Living Expenses: Not hard at all  Food Insecurity: No Food Insecurity (05/11/2024)   Hunger Vital Sign    Worried About Running Out of Food in the Last Year: Never true    Ran Out of Food in the Last Year: Never true  Transportation Needs: No Transportation Needs (05/11/2024)   PRAPARE - Administrator, Civil Service (Medical): No    Lack of Transportation (Non-Medical): No  Physical Activity: Sufficiently Active (03/04/2024)   Exercise Vital Sign    Days of Exercise per Week: 5 days    Minutes of Exercise per Session: 30 min  Stress: No Stress Concern Present (03/04/2024)   [employer] of Occupational Health - Occupational Stress Questionnaire    Feeling of Stress: Not at all  Social Connections: Moderately Integrated (03/04/2024)   Social Connection and Isolation Panel    Frequency of Communication with Friends and Family: More than three times a week    Frequency of Social Gatherings with Friends and Family: More than three times a week    Attends Religious Services: More than 4 times per year    Active Member of Clubs or Organizations: No    Attends Banker Meetings: Never    Marital Status: Married       Review of Systems  Vital Signs:   Advance Care Plan: no documents on  file    Physical Exam  Imaging: ARCOLA ROYLENE LIEN OP MEDICARE SPEECH PATH Result Date: 05/06/2024 MODIFIED BARIUM SWALLOW STUDY LATE ENTRY FROM 05/04/24 History of Present Illness Caden Fukushima is a 73 y.o. male who was referred by [facility] Pulmonary for an OP MBS. PMHx prostate cancer with lung nodules, recent bronchoscopy, DM, sleep apnea, recurrent pna.  Reports dysphagia with choking at least twice per week.  Improvements noted since he started on prednisone .   Clinical Impression Pt presents with normal oropharyngeal swallow with no aspiration.  There was adequate oral control and mastication.  Onset of the pharyngeal swallow was timely with normal mobility of the laryngeal complex.  There was sufficient contraction of the pharynx and normal propulsion through the UES.  There was  one incident of transient penetration (PAS 2) of thin liquids when swallowing the pill, considered WFL. Prominent Cricopharyngeus. No dysphagia.  Continue current diet, thin liquids.   Factors that may increase risk of adverse event in presence of aspiration Noe & Lianne 2021):  none  Swallow Evaluation Recommendations Recommendations: PO diet PO Diet Recommendation: Regular;Thin liquids (Level 0) Liquid Administration via: Cup;Straw Medication Administration: Whole meds with liquid Supervision: Patient able to self-feed Oral care recommendations: Oral care BID (2x/day)    Amanda L. Vona, MA CCC/SLP Clinical Specialist - Acute Care SLP Acute Rehabilitation Services Office number [phone] CLINICAL DATA:  MBS requested by pulmonology as part of ongoing workup of cough and dysphagia. EXAM: MODIFIED BARIUM SWALLOW TECHNIQUE: Radiologist, not in attendance for the exam. Different consistencies of barium were administered orally to the patient by the Speech Pathologist. Imaging of the pharynx was performed in the lateral projection. The radiology APP, Laymon Coast, NP was present in the fluoroscopy room for this study,  providing personal supervision. FLUOROSCOPY: Radiation Exposure Index (as provided by the fluoroscopic device): 51.28 mGy Kerma COMPARISON:  None Available. FINDINGS: Vestibular  Penetration:  None seen. Aspiration:  None seen. Other: Prominent cricopharyngeal bar without associated diverticulum. IMPRESSION: No evidence of penetration or aspiration or any other finding to explain symptoms. Please refer to the Speech Pathologists report for complete details and recommendations. Electronically Signed   By: Cordella Banner   On: 05/04/2024 14:48  DG Chest Port 1 View Result Date: 04/29/2024 CLINICAL DATA:  [id number] S/P bronchoscopy with biopsy [id number] EXAM: PORTABLE CHEST - 1 VIEW COMPARISON:  04/29/2024, 02/20/2024 FINDINGS: Low lung volumes. Bilateral perihilar interstitial opacities. Patchy nodular opacities in both mid and lower lung zones. No pneumothorax. No pleural effusion. Mild cardiomegaly. Tortuous aorta. No acute fracture or destructive lesions. Multilevel thoracic osteophytosis. IMPRESSION: 1. Low lung volumes with bilateral perihilar interstitial opacities, which may represent bronchovascular crowding or mild interstitial edema. No pneumothorax. 2. Similar patchy, nodular airspace opacities in both mid and lower lung zones, better visualized on the comparison chest CT. Electronically Signed   By: Rogelia Myers M.D.   On: 04/29/2024 12:23   DG C-ARM BRONCHOSCOPY Result Date: 04/29/2024 C-ARM BRONCHOSCOPY: Fluoroscopy was utilized by the requesting physician.  No radiographic interpretation.   DG C-Arm 1-60 Min-No Report Result Date: 04/29/2024 Fluoroscopy was utilized by the requesting physician.  No radiographic interpretation.   CT SUPER D CHEST WO CONTRAST Result Date: 04/29/2024 CLINICAL DATA:  Preoperative assessment for bronchoscopy. EXAM: CT CHEST WITHOUT CONTRAST TECHNIQUE: Multidetector CT imaging of the chest was performed using thin slice collimation for electromagnetic  bronchoscopy planning purposes, without intravenous contrast. RADIATION DOSE REDUCTION: This exam was performed according to the departmental dose-optimization program which includes automated exposure control, adjustment of the mA and/or kV according to patient size and/or use of iterative reconstruction technique. COMPARISON:  PET-CT 04/22/2024.  Chest CT 04/09/2024. FINDINGS: Cardiovascular: The heart size is normal. No substantial pericardial effusion. Mild atherosclerotic calcification is noted in the wall of the thoracic aorta. Mediastinum/Nodes: Similar mediastinal and right hilar lymphadenopathy as characterized on recent PET-CT. There is no axillary lymphadenopathy. Lungs/Pleura: Basilar predominant bilateral pulmonary nodules as characterized on recent PET-CT. Index 8 mm anterior right lung nodule identified on chest CT 04/09/2024 is 8 mm again today on image 76/4. Posterior right lower lobe index nodule measured previously at 17 mm is 17 mm again today on 96/4. Nodular opacity in the medial right middle lobe on 94/4 appears progressive in the interval and  may have an associated component of atelectasis on the current study. No substantial pleural effusion. Upper Abdomen: Hypodensity seen adjacent to the gallbladder on the lowest image (147/3) is probably volume averaging with the exophytic right renal cyst as seen on recent PET-CT. Perigastric ill-defined nodularity on 132/3 is associated with a amorphous soft tissue in the gastrohepatic ligament and upper normal gastrohepatic ligament lymph nodes. Musculoskeletal: Scattered small hypermetabolic bone lesions seen on previous PET-CT not readily evident by today's study. IMPRESSION: 1. Basilar predominant bilateral pulmonary nodules as characterized on recent PET-CT. 2. Nodular opacity in the medial right middle lobe appears progressive in the interval and may have an associated component of atelectasis on the current study. 3. Similar mediastinal and right  hilar lymphadenopathy as characterized on recent PET-CT. 4. Perigastric ill-defined nodularity is associated with a amorphous soft tissue in the gastrohepatic ligament and upper normal gastrohepatic ligament lymph nodes. 5. Scattered small hypermetabolic bone lesions seen on previous PET-CT not readily evident by today's study. 6.  Aortic Atherosclerosis (ICD10-I70.0). Electronically Signed   By: Camellia Candle M.D.   On: 04/29/2024 07:51   NM PET Image Initial (PI) Skull Base To Thigh Result Date: 04/22/2024 CLINICAL DATA:  Initial treatment strategy for pulmonary nodules and mediastinal/hilar adenopathy on recent chest CT. EXAM: NUCLEAR MEDICINE PET SKULL BASE TO THIGH TECHNIQUE: 10.06 mCi F-18 FDG was injected intravenously. Full-ring PET imaging was performed from the skull base to thigh after the radiotracer. CT data was obtained and used for attenuation correction and anatomic localization. Fasting blood glucose: 90 mg/dl COMPARISON:  Chest CT 90/73/7974 FINDINGS: Mediastinal blood pool activity: SUV max 2.2 Liver activity: SUV max NA NECK: No hypermetabolic lymph nodes in the neck. Incidental CT findings: None. CHEST: Enlarged mediastinal and hilar lymph nodes demonstrate low level hypermetabolism. Right-sided precarinal node has an SUV max of 3.3. Subcarinal node has an SUV max of 3.2. Left hilar node has an SUV max of 3.4. Left-sided supraclavicular nodes are small but are hypermetabolic both with SUV max of 4.4. Several pulmonary nodules as demonstrated on the CT scan. 7 mm right upper lobe nodule has an SUV max of 2.5. 8 mm right lower lobe pulmonary nodule has an SUV max of 2.2. 10 mm right middle lobe nodule has an SUV max of 3.5. Incidental CT findings: Stable pulmonary scarring. ABDOMEN/PELVIS: No abnormal hypermetabolic activity within the liver, pancreas, adrenal glands, or spleen. 13 mm left para-aortic node on image 91/2 2 demonstrates mild hypermetabolism with SUV max of 2.7. 11 mm left  external iliac node on image 123/202 has an SUV max of 2.7. Hypermetabolic focus in the lower anus area has an SUV max of 5.5 and is likely due to hemorrhoids. Incidental CT findings: Simple right renal cysts. Status post prostatectomy. SKELETON: Small scattered hypermetabolic bone lesions. T9 vertebral body region has an SUV max of 3.2. Left sacral lesion has an SUV max of 3.0. Left iliac bone lesion has an SUV max of 3.7. Lesion in the right acetabulum has SUV max of 3.9. Small hypermetabolic intramuscular lesions are noted in the left psoas muscle the most superior lesion has an SUV max of 3.7 in the more inferior lesions have an SUV max of 4.6. Incidental CT findings: None. IMPRESSION: 1. Hypermetabolic lymphadenopathy, pulmonary nodules and bone lesions consistent with metastatic disease. Findings likely due to metastatic prostate cancer given patient's history. Recommend correlation with PSA level. The left supraclavicular nodes may be the most amenable to biopsy. 2. Hypermetabolic focus in the  lower anus area is likely due to hemorrhoids. Electronically Signed   By: MYRTIS Stammer M.D.   On: 04/22/2024 23:22    Labs:  CBC: Recent Labs    03/05/24 1050 04/09/24 1958 04/14/24 1348 05/11/24 1205  WBC 10.4 15.0* 17.4* 14.3*  HGB 12.6* 11.3* 11.8* 11.6*  HCT 38.7* 36.8* 37.4* 37.0*  PLT 345.0 467* 478* 451*    COAGS: Recent Labs    12/21/23 1644  INR 1.2    BMP: Recent Labs    12/22/23 0453 12/25/23 1101 03/05/24 1050 04/09/24 1958 04/14/24 1348 05/11/24 1205  NA 135   < > 141 140 139 141  K 3.9   < > 4.1 4.2 4.2 4.0  CL 105   < > 106 103 106 106  CO2 22   < > 27 26 27 30   GLUCOSE 118*   < > 97 96 107* 79  BUN 15   < > 14 12 22 9   CALCIUM  7.6*   < > 9.1 9.5 8.7* 9.0  CREATININE 1.09   < > 1.12 1.19 1.06 1.06  GFRNONAA >60  --   --  >60 >60 >60   < > = values in this interval not displayed.    LIVER FUNCTION TESTS: Recent Labs    02/03/24 0927 03/05/24 1050  04/14/24 1348 05/11/24 1205  BILITOT 0.3 0.4 0.3 0.3  AST 24 22 19 15   ALT 21 23 26 10   ALKPHOS 65 79 80 87  PROT 7.2 8.2 8.0 8.0  ALBUMIN 3.4* 3.8 3.6 3.8    TUMOR MARKERS: No results for input(s): AFPTM, CEA, CA199, CHROMGRNA in the last 8760 hours.  Assessment and Plan: 73 y.o. male ex smoker with past medical history significant for sleep apnea with CPAP use, type 2 diabetes, hypertension, prostate cancer 2018 with prostatectomy, allergic rhinitis who presents now with newly diagnosed Hodgkin's lymphoma.  He is scheduled today for Port-A-Cath placement to assist with treatment.Risks and benefits of image guided port-a-catheter placement was discussed with the patient including, but not limited to bleeding, infection, pneumothorax, or fibrin sheath development and need for additional procedures.  All of the patient's questions were answered, patient is agreeable to proceed. Consent signed and in chart.    Thank you for allowing our service to participate in Tee Richeson 's care.  Electronically Signed: D. Franky Rakers, PA-C   05/12/2024, 4:25 PM      I spent a total of   20 minutes  in face to face in clinical consultation, greater than 50% of which was counseling/coordinating care for port a cath placement i

## 2024-05-14 ENCOUNTER — Encounter (HOSPITAL_COMMUNITY): Payer: Self-pay

## 2024-05-14 ENCOUNTER — Ambulatory Visit (HOSPITAL_COMMUNITY)
Admission: RE | Admit: 2024-05-14 | Discharge: 2024-05-14 | Disposition: A | Discharge status: Home / Self Care | Payer: Medicare (Managed Care) | Source: Ambulatory Visit | Attending: Hematology and Oncology | Admitting: Hematology and Oncology

## 2024-05-14 DIAGNOSIS — I10 Essential (primary) hypertension: Secondary | ICD-10-CM | POA: Diagnosis not present

## 2024-05-14 DIAGNOSIS — Z8546 Personal history of malignant neoplasm of prostate: Secondary | ICD-10-CM | POA: Insufficient documentation

## 2024-05-14 DIAGNOSIS — Z79899 Other long term (current) drug therapy: Secondary | ICD-10-CM | POA: Insufficient documentation

## 2024-05-14 DIAGNOSIS — E119 Type 2 diabetes mellitus without complications: Secondary | ICD-10-CM | POA: Insufficient documentation

## 2024-05-14 DIAGNOSIS — G4730 Sleep apnea, unspecified: Secondary | ICD-10-CM | POA: Insufficient documentation

## 2024-05-14 DIAGNOSIS — Z87891 Personal history of nicotine dependence: Secondary | ICD-10-CM | POA: Insufficient documentation

## 2024-05-14 DIAGNOSIS — C8178 Other classical Hodgkin lymphoma, lymph nodes of multiple sites: Secondary | ICD-10-CM | POA: Diagnosis not present

## 2024-05-14 HISTORY — PX: IR IMAGING GUIDED PORT INSERTION: IMG5740

## 2024-05-14 LAB — GLUCOSE, CAPILLARY: Glucose-Capillary: 82 mg/dL (ref 70–99)

## 2024-05-14 MED ORDER — MIDAZOLAM HCL 2 MG/2ML IJ SOLN
INTRAMUSCULAR | Status: AC
Start: 2024-05-14 — End: 2024-05-14
  Filled 2024-05-14: qty 2

## 2024-05-14 MED ORDER — HEPARIN SOD (PORK) LOCK FLUSH 100 UNIT/ML IV SOLN
INTRAVENOUS | Status: AC
  Filled 2024-05-14: qty 5

## 2024-05-14 MED ORDER — FENTANYL CITRATE (PF) 100 MCG/2ML IJ SOLN
INTRAMUSCULAR | Status: AC
  Filled 2024-05-14: qty 2

## 2024-05-14 MED ORDER — HEPARIN SOD (PORK) LOCK FLUSH 100 UNIT/ML IV SOLN
500.0000 [IU] | Freq: Once | INTRAVENOUS | Status: AC
  Administered 2024-05-14: 500 [IU] via INTRAVENOUS

## 2024-05-14 MED ORDER — MIDAZOLAM HCL (PF) 2 MG/2ML IJ SOLN
INTRAMUSCULAR | Status: AC | PRN
Start: 2024-05-14 — End: 2024-05-14
  Administered 2024-05-14: 1 mg via INTRAVENOUS
  Administered 2024-05-14 (×2): .5 mg via INTRAVENOUS

## 2024-05-14 MED ORDER — LIDOCAINE-EPINEPHRINE 1 %-1:100000 IJ SOLN
INTRAMUSCULAR | Status: AC
  Filled 2024-05-14: qty 1

## 2024-05-14 MED ORDER — LIDOCAINE-EPINEPHRINE 1 %-1:100000 IJ SOLN
20.0000 mL | Freq: Once | INTRAMUSCULAR | Status: AC
  Administered 2024-05-14: 20 mL via INTRADERMAL

## 2024-05-14 MED ORDER — LIDOCAINE HCL 1 % IJ SOLN
INTRAMUSCULAR | Status: AC
  Filled 2024-05-14: qty 20

## 2024-05-14 MED ORDER — LIDOCAINE HCL 1 % IJ SOLN
20.0000 mL | Freq: Once | INTRAMUSCULAR | Status: AC
  Administered 2024-05-14: 12 mL via INTRADERMAL

## 2024-05-14 MED ORDER — FENTANYL CITRATE (PF) 100 MCG/2ML IJ SOLN
INTRAMUSCULAR | Status: AC | PRN
  Administered 2024-05-14 (×2): 50 ug via INTRAVENOUS

## 2024-05-14 MED ORDER — SODIUM CHLORIDE 0.9 % IV SOLN: INTRAVENOUS | Status: DC

## 2024-05-14 NOTE — Procedures (Signed)
 Interventional Radiology Procedure:   Indications: Hodgkin's lymphoma  Procedure: Port placement  Findings: Right jugular port, tip at SVC/RA junction.    Complications: None     EBL: Minimal, less than 10 ml  Plan: Discharge in one hour.  Keep port site and incisions dry for at least 24 hours.     Roel Douthat R. Philip, MD  Pager: [phone]

## 2024-05-14 NOTE — Discharge Instructions (Signed)
 Moderate Conscious Sedation-Care After  This sheet gives you information about how to care for yourself after your procedure. Your health care provider may also give you more specific instructions. If you have problems or questions, contact your health care provider.  After the procedure, it is common to have: Sleepiness for several hours. Impaired judgment for several hours. Difficulty with balance. Vomiting if you eat too soon.  Follow these instructions at home:  Rest. Do not participate in activities where you could fall or become injured. Do not drive or use machinery. Do not drink alcohol. Do not take sleeping pills or medicines that cause drowsiness. Do not make important decisions or sign legal documents. Do not take care of children on your own.  Eating and drinking Follow the diet recommended by your health care provider. Drink enough fluid to keep your urine pale yellow. If you vomit: Drink water , juice, or soup when you can drink without vomiting. Make sure you have little or no nausea before eating solid foods.  General instructions Take over-the-counter and prescription medicines only as told by your health care provider. Have a responsible adult stay with you for the time you are told. It is important to have someone help care for you until you are awake and alert. Do not smoke. Keep all follow-up visits as told by your health care provider. This is important.  Contact a health care provider if: You are still sleepy or having trouble with balance after 24 hours. You feel light-headed. You keep feeling nauseous or you keep vomiting. You develop a rash. You have a fever. You have redness or swelling around the IV site.  Get help right away if: You have trouble breathing. You have new-onset confusion at home.  This information is not intended to replace advice given to you by your health care provider. Make sure you discuss any questions you have with your  healthcare provider.Implanted Port Insertion, Care After The following information offers guidance on how to care for yourself after your procedure. Your health care provider may also give you more specific instructions. If you have problems or questions, contact your health care provider. What can I expect after the procedure? After the procedure, it is common to have: Discomfort at the port insertion site. Bruising on the skin over the port. This should improve over 3-4 days. Follow these instructions at home: [employer] care After your port is placed, you will get a manufacturer's information card. The card has information about your port. Keep this card with you at all times. Take care of the port as told by your health care provider. Ask your health care provider if you or a family member can get training for taking care of the port at home. A home health care nurse will be be available to help care for the port. Make sure to remember what type of port you have. Incision care  Follow instructions from your health care provider about how to take care of your port insertion site. Make sure you: Wash your hands with soap and water  for at least 20 seconds before and after you change your bandage (dressing). If soap and water  are not available, use hand sanitizer. Change your dressing as told by your health care provider. Leave stitches (sutures), skin glue, or adhesive strips in place. These skin closures may need to stay in place for 2 weeks or longer. If adhesive strip edges start to loosen and curl up, you may trim the loose edges. Do  not remove adhesive strips completely unless your health care provider tells you to do that. Check your port insertion site every day for signs of infection. Check for: Redness, swelling, or pain. Fluid or blood. Warmth. Pus or a bad smell. Activity Return to your normal activities as told by your health care provider. Ask your health care provider what activities  are safe for you. You may have to avoid lifting. Ask your health care provider how much you can safely lift. General instructions Take over-the-counter and prescription medicines only as told by your health care provider. Do not take baths, swim, or use a hot tub until your health care provider approves. Ask your health care provider if you may take showers. You may only be allowed to take sponge baths. If you were given a sedative during the procedure, it can affect you for several hours. Do not drive or operate machinery until your health care provider says that it is safe. Wear a medical alert bracelet in case of an emergency. This will tell any health care providers that you have a port. Keep all follow-up visits. This is important. Contact a health care provider if: You cannot flush your port with saline as directed, or you cannot draw blood from the port. You have a fever or chills. You have redness, swelling, or pain around your port insertion site. You have fluid or blood coming from your port insertion site. Your port insertion site feels warm to the touch. You have pus or a bad smell coming from the port insertion site. Get help right away if: You have chest pain or shortness of breath. You have bleeding from your port that you cannot control. These symptoms may be an emergency. Get help right away. Call 911. Do not wait to see if the symptoms will go away. Do not drive yourself to the hospital. Summary Take care of the port as told by your health care provider. Keep the manufacturer's information card with you at all times. Change your dressing as told by your health care provider. Contact a health care provider if you have a fever or chills or if you have redness, swelling, or pain around your port insertion site. Keep all follow-up visits. This information is not intended to replace advice given to you by your health care provider. Make sure you discuss any questions you have  with your health care provider. Document Revised: 01/02/2021 Document Reviewed: 01/02/2021 Elsevier Patient Education  2024 [employer].

## 2024-05-17 ENCOUNTER — Other Ambulatory Visit: Payer: Self-pay | Admitting: Hematology and Oncology

## 2024-05-17 DIAGNOSIS — C8108 Nodular lymphocyte predominant Hodgkin lymphoma, lymph nodes of multiple sites: Secondary | ICD-10-CM

## 2024-05-17 DIAGNOSIS — C8190 Hodgkin lymphoma, unspecified, unspecified site: Secondary | ICD-10-CM | POA: Insufficient documentation

## 2024-05-17 NOTE — Progress Notes (Signed)
 START ON PATHWAY REGIMEN - Lymphoma and CLL     A cycle is every 28 days:     Dacarbazine      Doxorubicin      Vinblastine      Nivolumab   **Always confirm dose/schedule in your pharmacy ordering system**  Patient Characteristics: Classic Hodgkin Lymphoma, First Line, Stage III/IV Disease Type: Not Applicable Disease Type: Not Applicable Disease Type: Classic Hodgkin Lymphoma Line of therapy: First Line Intent of Therapy: Curative Intent, Discussed with Patient

## 2024-05-18 ENCOUNTER — Other Ambulatory Visit: Payer: Self-pay

## 2024-05-21 ENCOUNTER — Telehealth: Payer: Self-pay | Admitting: *Deleted

## 2024-05-21 NOTE — Telephone Encounter (Signed)
 Received vm message from pt's wife regarding her husband getting vaccinations. He is scheduled to start treatment for Hodgkin's lymphoma on 06/03/24. Discussed with Dr. Federico. He advised that pt can get these vaccinations prior to the start of treatment, with a week between each of the two vaccines. TCT pt's wife and advised of this recommendation. She voiced understanding.

## 2024-05-24 NOTE — Progress Notes (Signed)
 Patient originally followed by [facility]. Appointment with Dr Federico 05/11/24, I introduced myself and explained my role as a navigator and gave my card to patient and assured them they can call me at any time with questions or needs. Patient to start chemotherapy 06/03/24.

## 2024-05-25 ENCOUNTER — Encounter: Payer: Self-pay | Admitting: Family Medicine

## 2024-05-27 NOTE — Progress Notes (Signed)
 Pharmacist Chemotherapy Monitoring - Initial Assessment    Anticipated start date: 06/03/24    The following has been reviewed per standard work regarding the patient's treatment regimen: The patient's diagnosis, treatment plan and drug doses, and organ/hematologic function Lab orders and baseline tests specific to treatment regimen  The treatment plan start date, drug sequencing, and pre-medications Prior authorization status  Patient's documented medication list, including drug-drug interaction screen and prescriptions for anti-emetics and supportive care specific to the treatment regimen The drug concentrations, fluid compatibility, administration routes, and timing of the medications to be used The patient's access for treatment and lifetime cumulative dose history, if applicable  The patient's medication allergies and previous infusion related reactions, if applicable   Changes made to treatment plan:  treatment plan date  Follow up needed:  N/A   Bridgett Leotis Helling, RPH, BCPS, BCOP 05/27/2024   12:41 PM

## 2024-05-28 ENCOUNTER — Other Ambulatory Visit: Payer: Self-pay | Admitting: Hematology and Oncology

## 2024-05-28 ENCOUNTER — Inpatient Hospital Stay: Payer: Medicare (Managed Care) | Attending: Physician Assistant

## 2024-05-28 ENCOUNTER — Telehealth: Payer: Self-pay | Admitting: *Deleted

## 2024-05-28 DIAGNOSIS — Z5111 Encounter for antineoplastic chemotherapy: Secondary | ICD-10-CM | POA: Insufficient documentation

## 2024-05-28 DIAGNOSIS — I10 Essential (primary) hypertension: Secondary | ICD-10-CM | POA: Insufficient documentation

## 2024-05-28 DIAGNOSIS — Z7982 Long term (current) use of aspirin: Secondary | ICD-10-CM | POA: Insufficient documentation

## 2024-05-28 DIAGNOSIS — Z5112 Encounter for antineoplastic immunotherapy: Secondary | ICD-10-CM | POA: Insufficient documentation

## 2024-05-28 DIAGNOSIS — Z79899 Other long term (current) drug therapy: Secondary | ICD-10-CM | POA: Insufficient documentation

## 2024-05-28 DIAGNOSIS — G4730 Sleep apnea, unspecified: Secondary | ICD-10-CM | POA: Insufficient documentation

## 2024-05-28 DIAGNOSIS — C8198 Hodgkin lymphoma, unspecified, lymph nodes of multiple sites: Secondary | ICD-10-CM | POA: Insufficient documentation

## 2024-05-28 DIAGNOSIS — F17210 Nicotine dependence, cigarettes, uncomplicated: Secondary | ICD-10-CM | POA: Insufficient documentation

## 2024-05-28 DIAGNOSIS — E119 Type 2 diabetes mellitus without complications: Secondary | ICD-10-CM | POA: Insufficient documentation

## 2024-05-28 DIAGNOSIS — Z7951 Long term (current) use of inhaled steroids: Secondary | ICD-10-CM | POA: Insufficient documentation

## 2024-05-28 DIAGNOSIS — Z7952 Long term (current) use of systemic steroids: Secondary | ICD-10-CM | POA: Insufficient documentation

## 2024-05-28 MED ORDER — GUAIFENESIN-CODEINE 100-10 MG/5ML PO SOLN: 10.0000 mL | Freq: Four times a day (QID) | ORAL | 0 refills | Status: AC | PRN

## 2024-05-28 MED ORDER — PREDNISONE 20 MG PO TABS: 40.0000 mg | ORAL_TABLET | Freq: Every day | ORAL | 0 refills | Status: AC

## 2024-05-28 NOTE — Telephone Encounter (Signed)
 Received call from pt's wife. She states his coughing is getting worse again and he coughs so hard, he passes out. He is sitting down when this happens but is only out for a few seconds and then wakes back up again. This has happened in the recent past. Discussed with Dr. Federico. He will prescribe a short course of steroids and a codeine cough suppresent. Pt is agreeable to thsi plan. He is aware of his appts to start chemo next week.

## 2024-06-02 MED FILL — Fosaprepitant Dimeglumine For IV Infusion 150 MG (Base Eq): INTRAVENOUS | Qty: 5 | Status: AC

## 2024-06-03 ENCOUNTER — Inpatient Hospital Stay: Payer: Medicare (Managed Care)

## 2024-06-03 ENCOUNTER — Encounter: Payer: Self-pay | Admitting: Hematology and Oncology

## 2024-06-03 ENCOUNTER — Inpatient Hospital Stay (HOSPITAL_BASED_OUTPATIENT_CLINIC_OR_DEPARTMENT_OTHER): Payer: Medicare (Managed Care) | Admitting: Hematology and Oncology

## 2024-06-03 ENCOUNTER — Other Ambulatory Visit: Payer: Self-pay | Admitting: Hematology and Oncology

## 2024-06-03 VITALS — BP 137/80 | HR 92 | Temp 98.6°F | Resp 18 | Wt 198.0 lb

## 2024-06-03 DIAGNOSIS — C8108 Nodular lymphocyte predominant Hodgkin lymphoma, lymph nodes of multiple sites: Secondary | ICD-10-CM

## 2024-06-03 DIAGNOSIS — R053 Chronic cough: Secondary | ICD-10-CM | POA: Diagnosis not present

## 2024-06-03 DIAGNOSIS — G4730 Sleep apnea, unspecified: Secondary | ICD-10-CM | POA: Diagnosis not present

## 2024-06-03 DIAGNOSIS — Z7952 Long term (current) use of systemic steroids: Secondary | ICD-10-CM | POA: Diagnosis not present

## 2024-06-03 DIAGNOSIS — Z79899 Other long term (current) drug therapy: Secondary | ICD-10-CM | POA: Diagnosis not present

## 2024-06-03 DIAGNOSIS — F17210 Nicotine dependence, cigarettes, uncomplicated: Secondary | ICD-10-CM | POA: Diagnosis not present

## 2024-06-03 DIAGNOSIS — C8198 Hodgkin lymphoma, unspecified, lymph nodes of multiple sites: Secondary | ICD-10-CM | POA: Diagnosis not present

## 2024-06-03 DIAGNOSIS — C8178 Other classical Hodgkin lymphoma, lymph nodes of multiple sites: Secondary | ICD-10-CM

## 2024-06-03 DIAGNOSIS — Z5112 Encounter for antineoplastic immunotherapy: Secondary | ICD-10-CM | POA: Diagnosis not present

## 2024-06-03 DIAGNOSIS — Z5111 Encounter for antineoplastic chemotherapy: Secondary | ICD-10-CM | POA: Diagnosis not present

## 2024-06-03 DIAGNOSIS — I10 Essential (primary) hypertension: Secondary | ICD-10-CM | POA: Diagnosis not present

## 2024-06-03 DIAGNOSIS — E119 Type 2 diabetes mellitus without complications: Secondary | ICD-10-CM | POA: Diagnosis not present

## 2024-06-03 DIAGNOSIS — Z7982 Long term (current) use of aspirin: Secondary | ICD-10-CM | POA: Diagnosis not present

## 2024-06-03 DIAGNOSIS — R599 Enlarged lymph nodes, unspecified: Secondary | ICD-10-CM

## 2024-06-03 DIAGNOSIS — Z7951 Long term (current) use of inhaled steroids: Secondary | ICD-10-CM | POA: Diagnosis not present

## 2024-06-03 LAB — CBC WITH DIFFERENTIAL (CANCER CENTER ONLY)
Abs Immature Granulocytes: 0.35 K/uL — ABNORMAL HIGH (ref 0.00–0.07)
Basophils Absolute: 0.1 K/uL (ref 0.0–0.1)
Basophils Relative: 0 %
Eosinophils Absolute: 0.3 K/uL (ref 0.0–0.5)
Eosinophils Relative: 1 %
HCT: 35.9 % — ABNORMAL LOW (ref 39.0–52.0)
Hemoglobin: 11.6 g/dL — ABNORMAL LOW (ref 13.0–17.0)
Immature Granulocytes: 1 %
Lymphocytes Relative: 6 %
Lymphs Abs: 1.5 K/uL (ref 0.7–4.0)
MCH: 26.4 pg (ref 26.0–34.0)
MCHC: 32.3 g/dL (ref 30.0–36.0)
MCV: 81.8 fL (ref 80.0–100.0)
Monocytes Absolute: 1.7 K/uL — ABNORMAL HIGH (ref 0.1–1.0)
Monocytes Relative: 7 %
Neutro Abs: 20.9 K/uL — ABNORMAL HIGH (ref 1.7–7.7)
Neutrophils Relative %: 85 %
Platelet Count: 466 K/uL — ABNORMAL HIGH (ref 150–400)
RBC: 4.39 MIL/uL (ref 4.22–5.81)
RDW: 17.6 % — ABNORMAL HIGH (ref 11.5–15.5)
WBC Count: 24.8 K/uL — ABNORMAL HIGH (ref 4.0–10.5)
nRBC: 0 % (ref 0.0–0.2)

## 2024-06-03 LAB — CMP (CANCER CENTER ONLY)
ALT: 11 U/L (ref 0–44)
AST: 18 U/L (ref 15–41)
Albumin: 3.4 g/dL — ABNORMAL LOW (ref 3.5–5.0)
Alkaline Phosphatase: 82 U/L (ref 38–126)
Anion gap: 10 (ref 5–15)
BUN: 14 mg/dL (ref 8–23)
CO2: 28 mmol/L (ref 22–32)
Calcium: 8.8 mg/dL — ABNORMAL LOW (ref 8.9–10.3)
Chloride: 102 mmol/L (ref 98–111)
Creatinine: 1.21 mg/dL (ref 0.61–1.24)
GFR, Estimated: >60 mL/min (ref >60)
Glucose, Bld: 107 mg/dL — ABNORMAL HIGH (ref 70–99)
Potassium: 4 mmol/L (ref 3.5–5.1)
Sodium: 139 mmol/L (ref 135–145)
Total Bilirubin: 0.2 mg/dL (ref 0.0–1.2)
Total Protein: 7.4 g/dL (ref 6.5–8.1)

## 2024-06-03 LAB — TSH: TSH: 0.856 u[IU]/mL (ref 0.350–4.500)

## 2024-06-03 MED ORDER — SODIUM CHLORIDE 0.9 % IV SOLN
150.0000 mg | Freq: Once | INTRAVENOUS | Status: AC
  Administered 2024-06-03: 150 mg via INTRAVENOUS
  Filled 2024-06-03: qty 150

## 2024-06-03 MED ORDER — SODIUM CHLORIDE 0.9 % IV SOLN: INTRAVENOUS | Status: DC

## 2024-06-03 MED ORDER — SODIUM CHLORIDE 0.9 % IV SOLN
375.0000 mg/m2 | Freq: Once | INTRAVENOUS | Status: AC
  Administered 2024-06-03: 800 mg via INTRAVENOUS
  Filled 2024-06-03: qty 80

## 2024-06-03 MED ORDER — VINBLASTINE SULFATE CHEMO INJECTION 1 MG/ML
6.0000 mg/m2 | Freq: Once | INTRAVENOUS | Status: DC
  Filled 2024-06-03: qty 12.2

## 2024-06-03 MED ORDER — SODIUM CHLORIDE 0.9 % IV SOLN
240.0000 mg | Freq: Once | INTRAVENOUS | Status: AC
  Administered 2024-06-03: 240 mg via INTRAVENOUS
  Filled 2024-06-03: qty 24

## 2024-06-03 MED ORDER — DEXAMETHASONE SOD PHOSPHATE PF 10 MG/ML IJ SOLN
10.0000 mg | Freq: Once | INTRAMUSCULAR | Status: AC
  Administered 2024-06-03: 10 mg via INTRAVENOUS

## 2024-06-03 MED ORDER — DOXORUBICIN HCL CHEMO IV INJECTION 2 MG/ML
25.0000 mg/m2 | Freq: Once | INTRAVENOUS | Status: AC
  Administered 2024-06-03: 52 mg via INTRAVENOUS
  Filled 2024-06-03: qty 26

## 2024-06-03 MED ORDER — ALTEPLASE 2 MG IJ SOLR
2.0000 mg | Freq: Once | INTRAMUSCULAR | Status: AC | PRN
  Administered 2024-06-03: 2 mg
  Filled 2024-06-03: qty 2

## 2024-06-03 MED ORDER — PALONOSETRON HCL INJECTION 0.25 MG/5ML
0.2500 mg | Freq: Once | INTRAVENOUS | Status: AC
  Administered 2024-06-03: 0.25 mg via INTRAVENOUS
  Filled 2024-06-03: qty 5

## 2024-06-03 NOTE — Progress Notes (Signed)
 [facility] [phone]) [phone]   [phone]  PROGRESS NOTE   Patient Care Team: Avelina Greig BRAVO, MD as PCP - General Renda Glance, MD as Consulting Physician (Urology) Nieves Cough, MD as Consulting Physician (Urology) Parrett, Madelin RAMAN, NP as Nurse Practitioner (Pulmonary Disease) Elana Montie CROME, RN as Oncology Nurse Navigator Elana Montie CROME, RN as Oncology Nurse Navigator  Hematological/Oncological History # Hodgkin's Lymphoma, Stage IV  04/14/2024: establish care with [facility] clinic 04/22/2024: PET CT scan showed hypermetabolic lymphadenopathy, pulmonary nodules and bone lesions consistent with metastatic disease 04/29/2024: bronchoscopy with lymph node biopsy showed cells morphologically and immunophenotypically consistent with classical  Hodgkin's lymphoma.  05/11/2024: establish care with Dr. Federico  06/03/2024: Cycle 1 Day 1 of Nivo-AVD chemotherapy 06/04/2024: Nivo and Adriamycin  given on Day 2 due to port issues on Day 1  HISTORY OF PRESENTING ILLNESS:  Chase Moreno 74 y.o. male with medical history significant for Hodgkin's lymphoma who presents today for follow-up visit and treatment.  On exam today Mr. Chase Moreno chemotherapy education went well.  He has port is in place but unfortunately is not drawing back blood as well as we would like.  He reports that he received codeine  cough syrup and and steroids which have been helping with his cough.  He reports has been good lightheaded on occasion, particular when coughing.  He does is not having any fevers, chills, sweats, runny nose or sore throat.  He is having some back pain for which he is taking Tylenol .  He denies any new bumps or lumps concerning for lymphadenopathy.  He reports he has been eating well and his energy levels remain strong.  Overall he is willing and able to proceed with chemotherapy treatment today.  Full 10 point ROS is otherwise negative.  The bulk of our discussion focused  on assuring he had everything needed to start treatment.   MEDICAL HISTORY:  Past Medical History:  Diagnosis Date   Allergic rhinitis, cause unspecified    Cancer (HCC)    prostate cancer 2018   Diabetes mellitus without complication (HCC)    patient denies although dx in 2017 with a Dr Avelina ; last hgA1c was however 5.5    Unspecified essential hypertension    Unspecified sleep apnea    CPAP machine use     SURGICAL HISTORY: Past Surgical History:  Procedure Laterality Date   arm surgery Left    ENDOBRONCHIAL ULTRASOUND N/A 04/29/2024   Procedure: ENDOBRONCHIAL ULTRASOUND (EBUS);  Surgeon: Kara Dorn NOVAK, MD;  Location: [facility] ENDOSCOPY;  Service: Pulmonary;  Laterality: N/A;   IR IMAGING GUIDED PORT INSERTION  05/14/2024   LYMPHADENECTOMY Bilateral 02/17/2017   Procedure: LYMPHADENECTOMY;  Surgeon: Renda Glance, MD;  Location: [facility];  Service: Urology;  Laterality: Bilateral;   ROBOT ASSISTED LAPAROSCOPIC RADICAL PROSTATECTOMY N/A 02/17/2017   Procedure: XI ROBOTIC ASSISTED LAPAROSCOPIC RADICAL PROSTATECTOMY LEVEL 2;  Surgeon: Renda Glance, MD;  Location: [facility];  Service: Urology;  Laterality: N/A;   TONSILLECTOMY     VIDEO BRONCHOSCOPY WITH ENDOBRONCHIAL NAVIGATION N/A 04/29/2024   Procedure: VIDEO BRONCHOSCOPY WITH ENDOBRONCHIAL NAVIGATION;  Surgeon: Kara Dorn NOVAK, MD;  Location: [facility] ENDOSCOPY;  Service: Pulmonary;  Laterality: N/A;    SOCIAL HISTORY: Social History   Socioeconomic History   Marital status: Married    Spouse name: Not on file   Number of children: 2   Years of education: Not on file   Highest education level: Not on file  Occupational History   Occupation: machine  shop    Comment: Physiological scientist  Tobacco Use   Smoking status: Former   Smokeless tobacco: Never   Tobacco comments:    quit over 40 years ago    Smoked for about 8-10 years, about 1 pack per week.  Vaping Use   Vaping status: Never Used  Substance and Sexual  Activity   Alcohol use: No    Alcohol/week: 0.0 standard drinks of alcohol   Drug use: No   Sexual activity: Not on file  Other Topics Concern   Not on file  Social History Narrative   Regular exercise---yes, rabbit hunting, walking 2 times  A week.      Diet---eating fruit and veggies, limiting meat.      Widowed: Remarried.         Social Drivers of Corporate Investment Banker Strain: Low Risk  (03/04/2024)   Overall Financial Resource Strain (CARDIA)    Difficulty of Paying Living Expenses: Not hard at all  Food Insecurity: No Food Insecurity (05/11/2024)   Hunger Vital Sign    Worried About Running Out of Food in the Last Year: Never true    Ran Out of Food in the Last Year: Never true  Transportation Needs: No Transportation Needs (05/11/2024)   PRAPARE - Administrator, Civil Service (Medical): No    Lack of Transportation (Non-Medical): No  Physical Activity: Sufficiently Active (03/04/2024)   Exercise Vital Sign    Days of Exercise per Week: 5 days    Minutes of Exercise per Session: 30 min  Stress: No Stress Concern Present (03/04/2024)   [employer] of Occupational Health - Occupational Stress Questionnaire    Feeling of Stress: Not at all  Social Connections: Moderately Integrated (03/04/2024)   Social Connection and Isolation Panel    Frequency of Communication with Friends and Family: More than three times a week    Frequency of Social Gatherings with Friends and Family: More than three times a week    Attends Religious Services: More than 4 times per year    Active Member of [employer] or Organizations: No    Attends Banker Meetings: Never    Marital Status: Married  Catering Manager Violence: Not At Risk (05/11/2024)   Humiliation, Afraid, Rape, and Kick questionnaire    Fear of Current or Ex-Partner: No    Emotionally Abused: No    Physically Abused: No    Sexually Abused: No    FAMILY HISTORY: Family History  Problem  Relation Age of Onset   Arthritis Mother    Arrhythmia Mother    Stroke Paternal Grandmother     ALLERGIES:  has no known allergies.  MEDICATIONS:  Current Outpatient Medications  Medication Sig Dispense Refill   allopurinol  (ZYLOPRIM ) 300 MG tablet Take 1 tablet (300 mg total) by mouth daily. 90 tablet 1   aspirin  EC 81 MG tablet Take 81 mg by mouth daily. Swallow whole.     atorvastatin  (LIPITOR) 10 MG tablet Take 1 tablet (10 mg total) by mouth daily. 90 tablet 3   cholecalciferol (VITAMIN D3) 25 MCG (1000 UNIT) tablet Take 1,000 Units by mouth daily.     fluticasone -salmeterol (ADVAIR DISKUS) 500-50 MCG/ACT AEPB Inhale 1 puff into the lungs in the morning and at bedtime. (Patient not taking: Reported on 05/11/2024) 60 each 6   guaiFENesin -codeine  100-10 MG/5ML syrup Take 10 mLs by mouth every 6 (six) hours as needed for cough. 237 mL 0   lidocaine -prilocaine  (EMLA ) cream  Apply 1 Application topically as needed. 30 g 0   losartan  (COZAAR ) 50 MG tablet Take 1 tablet (50 mg total) by mouth daily. 90 tablet 3   naproxen sodium (ALEVE) 220 MG tablet Take 220 mg by mouth daily as needed (for pain).     ondansetron  (ZOFRAN ) 8 MG tablet Take 1 tablet (8 mg total) by mouth every 8 (eight) hours as needed. 30 tablet 0   predniSONE  (DELTASONE ) 20 MG tablet Take 2 tablets (40 mg total) by mouth daily with breakfast. Take for 4 days. 8 tablet 0   prochlorperazine  (COMPAZINE ) 10 MG tablet Take 1 tablet (10 mg total) by mouth every 6 (six) hours as needed for nausea or vomiting. 30 tablet 0   zinc sulfate, 50mg  elemental zinc, 220 (50 Zn) MG capsule Take 220 mg by mouth daily.     No current facility-administered medications for this visit.    REVIEW OF SYSTEMS:   Constitutional: ( - ) fevers, ( - )  chills , ( - ) night sweats Eyes: ( - ) blurriness of vision, ( - ) double vision, ( - ) watery eyes Ears, nose, mouth, throat, and face: ( - ) mucositis, ( - ) sore throat Respiratory: ( - ) cough,  ( - ) dyspnea, ( - ) wheezes Cardiovascular: ( - ) palpitation, ( - ) chest discomfort, ( - ) lower extremity swelling Gastrointestinal:  ( - ) nausea, ( - ) heartburn, ( - ) change in bowel habits Skin: ( - ) abnormal skin rashes Lymphatics: ( - ) new lymphadenopathy, ( - ) easy bruising Neurological: ( - ) numbness, ( - ) tingling, ( - ) new weaknesses Behavioral/Psych: ( - ) mood change, ( - ) new changes  All other systems were reviewed with the patient and are negative.  PHYSICAL EXAMINATION:  There were no vitals filed for this visit.  There were no vitals filed for this visit.   GENERAL: well appearing elderly African-American male in NAD  SKIN: skin color, texture, turgor are normal, no rashes or significant lesions EYES: conjunctiva are pink and non-injected, sclera clear LUNGS: clear to auscultation and percussion with normal breathing effort HEART: regular rate & rhythm and no murmurs and no lower extremity edema Musculoskeletal: no cyanosis of digits and no clubbing  PSYCH: alert & oriented x 3, fluent speech NEURO: no focal motor/sensory deficits  LABORATORY DATA:  I have reviewed the data as listed    Latest Ref Rng & Units 06/03/2024    1:03 PM 05/11/2024   12:05 PM 04/14/2024    1:48 PM  CBC  WBC 4.0 - 10.5 K/uL 24.8  14.3  17.4   Hemoglobin 13.0 - 17.0 g/dL 88.3  88.3  88.1   Hematocrit 39.0 - 52.0 % 35.9  37.0  37.4   Platelets 150 - 400 K/uL 466  451  478        Latest Ref Rng & Units 05/12/2024   12:00 AM 05/11/2024   12:05 PM 04/14/2024    1:48 PM  CMP  Glucose 70 - 99 mg/dL  79  892   BUN 8 - 23 mg/dL  9  22   Creatinine 0.6 - 1.3 0.8     1.06  1.06   Sodium 135 - 145 mmol/L  141  139   Potassium 3.5 - 5.1 mmol/L  4.0  4.2   Chloride 98 - 111 mmol/L  106  106   CO2 22 - 32 mmol/L  30  27   Calcium  8.9 - 10.3 mg/dL  9.0  8.7   Total Protein 6.5 - 8.1 g/dL  8.0  8.0   Total Bilirubin 0.0 - 1.2 mg/dL  0.3  0.3   Alkaline Phos 38 - 126 U/L  87   80   AST 15 - 41 U/L  15  19   ALT 0 - 44 U/L  10  26      This result is from an external source.     ASSESSMENT & PLAN Chase Moreno 73 y.o. male with medical history significant for sleep apnea with CPAP machine, type 2 diabetes, hypertension, prostate cancer, and allergic rhinitis who presents for evaluation of newly diagnosed Hodgkin's lymphoma.    After review of the labs, review of the records, and discussion with the patient the patients findings are most consistent with a new diagnosis of Hodgkin's lymphoma stage IV.  Today we discussed the diagnosis, prognosis, and treatment moving forward.  He voices understanding of our findings and plan moving forward.  # Hodgkin's Lymphoma, Stage IV  -- Diagnosis confirmed with lymph node biopsy via bronchoscopy as well as PET CT scan. -- Treatment of choice for stage IV Hodgkin's lymphoma would be Nivo-AVD chemotherapy. PLAN: -- Today is cycle 1 day 1 of Nivo AVD -- Labs today show white blood cell 24.8, hemoglobin 11.6, MCV 81.8, platelets 466. Cr and LFTs WNL.  --Unfortunately will require a day 2 of treatment due to difficulties with his port. -- Return to clinic for next treatment in 2 weeks time.  #Supportive Care -- chemotherapy education completed -- port placed --Echocardiogram adequate for treatment.  -- zofran  8mg  q8H PRN and compazine  10mg  PO q6H for nausea -- allopurinol  300mg  PO daily for TLS prophylaxis -- EMLA  cream for port -- no pain medication required at this time.    No orders of the defined types were placed in this encounter.   All questions were answered. The patient knows to call the clinic with any problems, questions or concerns.  A total of more than 30 minutes were spent on this encounter with face-to-face time and non-face-to-face time, including preparing to see the patient, ordering tests and/or medications, counseling the patient and coordination of care as outlined above.   Norleen IVAR Kidney,  MD Department of Hematology/Oncology [facility] at [facility] Phone: [phone] Pager: [phone] Email: [email or web address]  06/03/2024 1:32 PM

## 2024-06-03 NOTE — Progress Notes (Signed)
 Pt had difficulty with blood return from his port which required his Doxorubicin and Vincristine to be held per Dr. Norleen Kidney. Peripheral IV was placed to administer DTIC and Opdivo. 2nd dose of cathflow was administered and successful. Pt to come in on 11/20 at 0830 to continue tx per Jacquline Wiley, RN and Dr. Norleen Kidney

## 2024-06-03 NOTE — Patient Instructions (Signed)
 [facility] - A DEPT OF [facility]  Discharge Instructions: Thank you for choosing [facility] to provide your oncology and hematology care.   If you have a lab appointment with the Cancer Center, please go directly to the Cancer Center and check in at the registration area.   Wear comfortable clothing and clothing appropriate for easy access to any Portacath or PICC line.   We strive to give you quality time with your provider. You may need to reschedule your appointment if you arrive late (15 or more minutes).  Arriving late affects you and other patients whose appointments are after yours.  Also, if you miss three or more appointments without notifying the office, you may be dismissed from the clinic at the provider's discretion.      For prescription refill requests, have your pharmacy contact our office and allow 72 hours for refills to be completed.    Today you received the following chemotherapy and/or immunotherapy agents: nivolumab (OPDIVO), vinBLAStine (VELBAN)   To help prevent nausea and vomiting after your treatment, we encourage you to take your nausea medication as directed.  BELOW ARE SYMPTOMS THAT SHOULD BE REPORTED IMMEDIATELY: *FEVER GREATER THAN 100.4 F (38 C) OR HIGHER *CHILLS OR SWEATING *NAUSEA AND VOMITING THAT IS NOT CONTROLLED WITH YOUR NAUSEA MEDICATION *UNUSUAL SHORTNESS OF BREATH *UNUSUAL BRUISING OR BLEEDING *URINARY PROBLEMS (pain or burning when urinating, or frequent urination) *BOWEL PROBLEMS (unusual diarrhea, constipation, pain near the anus) TENDERNESS IN MOUTH AND THROAT WITH OR WITHOUT PRESENCE OF ULCERS (sore throat, sores in mouth, or a toothache) UNUSUAL RASH, SWELLING OR PAIN  UNUSUAL VAGINAL DISCHARGE OR ITCHING   Items with * indicate a potential emergency and should be followed up as soon as possible or go to the Emergency Department if any problems should occur.  Please show the CHEMOTHERAPY  ALERT CARD or IMMUNOTHERAPY ALERT CARD at check-in to the Emergency Department and triage nurse.  Should you have questions after your visit or need to cancel or reschedule your appointment, please contact [facility] - A DEPT OF [facility]  Dept: [phone]  and follow the prompts.  Office hours are 8:00 a.m. to 4:30 p.m. Monday - Friday. Please note that voicemails left after 4:00 p.m. may not be returned until the following business day.  We are closed weekends and major holidays. You have access to a nurse at all times for urgent questions. Please call the main number to the clinic Dept: [phone] and follow the prompts.   For any non-urgent questions, you may also contact your provider using MyChart. We now offer e-Visits for anyone 28 and older to request care online for non-urgent symptoms. For details visit [email or web address].   Also download the MyChart app! Go to the app store, search MyChart, open the app, select [place], and log in with your MyChart username and password.

## 2024-06-04 ENCOUNTER — Inpatient Hospital Stay: Payer: Medicare (Managed Care)

## 2024-06-04 VITALS — BP 141/88 | HR 98 | Temp 97.6°F | Resp 18

## 2024-06-04 DIAGNOSIS — Z5112 Encounter for antineoplastic immunotherapy: Secondary | ICD-10-CM | POA: Diagnosis not present

## 2024-06-04 DIAGNOSIS — C8108 Nodular lymphocyte predominant Hodgkin lymphoma, lymph nodes of multiple sites: Secondary | ICD-10-CM

## 2024-06-04 LAB — T4: T4, Total: 6.6 ug/dL (ref 4.5–12.0)

## 2024-06-04 MED ORDER — VINBLASTINE SULFATE CHEMO INJECTION 1 MG/ML
6.0000 mg/m2 | Freq: Once | INTRAVENOUS | Status: AC
  Administered 2024-06-04: 12.2 mg via INTRAVENOUS
  Filled 2024-06-04: qty 12.2

## 2024-06-04 MED ORDER — DOXORUBICIN HCL CHEMO IV INJECTION 2 MG/ML
25.0000 mg/m2 | Freq: Once | INTRAVENOUS | Status: AC
  Administered 2024-06-04: 52 mg via INTRAVENOUS
  Filled 2024-06-04: qty 26

## 2024-06-04 MED ORDER — SODIUM CHLORIDE 0.9 % IV SOLN: INTRAVENOUS | Status: DC

## 2024-06-04 MED ORDER — DEXAMETHASONE SOD PHOSPHATE PF 10 MG/ML IJ SOLN
10.0000 mg | Freq: Once | INTRAMUSCULAR | Status: AC
  Administered 2024-06-04: 10 mg via INTRAVENOUS

## 2024-06-04 NOTE — Patient Instructions (Signed)
 [facility] - A DEPT OF [facility]  Discharge Instructions: Thank you for choosing [facility] to provide your oncology and hematology care.   If you have a lab appointment with the Cancer Center, please go directly to the Cancer Center and check in at the registration area.   Wear comfortable clothing and clothing appropriate for easy access to any Portacath or PICC line.   We strive to give you quality time with your provider. You may need to reschedule your appointment if you arrive late (15 or more minutes).  Arriving late affects you and other patients whose appointments are after yours.  Also, if you miss three or more appointments without notifying the office, you may be dismissed from the clinic at the provider's discretion.      For prescription refill requests, have your pharmacy contact our office and allow 72 hours for refills to be completed.    Today you received the following chemotherapy and/or immunotherapy agents: Doxorubicin  (Adriamycin ), Vinblastine  (Velban )      To help prevent nausea and vomiting after your treatment, we encourage you to take your nausea medication as directed.  BELOW ARE SYMPTOMS THAT SHOULD BE REPORTED IMMEDIATELY: *FEVER GREATER THAN 100.4 F (38 C) OR HIGHER *CHILLS OR SWEATING *NAUSEA AND VOMITING THAT IS NOT CONTROLLED WITH YOUR NAUSEA MEDICATION *UNUSUAL SHORTNESS OF BREATH *UNUSUAL BRUISING OR BLEEDING *URINARY PROBLEMS (pain or burning when urinating, or frequent urination) *BOWEL PROBLEMS (unusual diarrhea, constipation, pain near the anus) TENDERNESS IN MOUTH AND THROAT WITH OR WITHOUT PRESENCE OF ULCERS (sore throat, sores in mouth, or a toothache) UNUSUAL RASH, SWELLING OR PAIN  UNUSUAL VAGINAL DISCHARGE OR ITCHING   Items with * indicate a potential emergency and should be followed up as soon as possible or go to the Emergency Department if any problems should occur.  Please show the  CHEMOTHERAPY ALERT CARD or IMMUNOTHERAPY ALERT CARD at check-in to the Emergency Department and triage nurse.  Should you have questions after your visit or need to cancel or reschedule your appointment, please contact [facility]  Dept: [phone]  and follow the prompts.  Office hours are 8:00 a.m. to 4:30 p.m. Monday - Friday. Please note that voicemails left after 4:00 p.m. may not be returned until the following business day.  We are closed weekends and major holidays. You have access to a nurse at all times for urgent questions. Please call the main number to the clinic Dept: [phone] and follow the prompts.   For any non-urgent questions, you may also contact your provider using MyChart. We now offer e-Visits for anyone 5 and older to request care online for non-urgent symptoms. For details visit [email or web address].   Also download the MyChart app! Go to the app store, search MyChart, open the app, select [place], and log in with your MyChart username and password.

## 2024-06-04 NOTE — Progress Notes (Signed)
 Nursing had issues with this patient's port yesterday. Port was cathflo a second time yesterday evening and had successful blood return. They were able to administer Opdivo  and dacarbazine  yesterday via PIV. Pt to receive Adriamycin  and vinblastine  today. C1D2 created in tx plan per Dr. Federico.  Glorian Mcdonell, PharmD, MBA

## 2024-06-05 ENCOUNTER — Encounter: Payer: Self-pay | Admitting: Hematology and Oncology

## 2024-06-15 ENCOUNTER — Other Ambulatory Visit: Payer: Self-pay | Admitting: Hematology and Oncology

## 2024-06-15 MED FILL — Fosaprepitant Dimeglumine For IV Infusion 150 MG (Base Eq): INTRAVENOUS | Qty: 5 | Status: AC

## 2024-06-16 ENCOUNTER — Inpatient Hospital Stay: Payer: Medicare (Managed Care)

## 2024-06-16 ENCOUNTER — Other Ambulatory Visit: Payer: Self-pay | Admitting: Hematology and Oncology

## 2024-06-16 ENCOUNTER — Inpatient Hospital Stay: Payer: Medicare (Managed Care) | Admitting: Hematology and Oncology

## 2024-06-16 ENCOUNTER — Other Ambulatory Visit: Payer: Self-pay | Admitting: *Deleted

## 2024-06-16 ENCOUNTER — Inpatient Hospital Stay: Payer: Medicare (Managed Care) | Attending: Physician Assistant

## 2024-06-16 ENCOUNTER — Inpatient Hospital Stay (HOSPITAL_BASED_OUTPATIENT_CLINIC_OR_DEPARTMENT_OTHER): Payer: Medicare (Managed Care)

## 2024-06-16 ENCOUNTER — Encounter: Payer: Self-pay | Admitting: Hematology and Oncology

## 2024-06-16 ENCOUNTER — Ambulatory Visit (HOSPITAL_COMMUNITY)
Admission: RE | Admit: 2024-06-16 | Discharge: 2024-06-16 | Disposition: A | Payer: Medicare (Managed Care) | Source: Ambulatory Visit | Attending: Hematology and Oncology

## 2024-06-16 ENCOUNTER — Telehealth: Payer: Self-pay | Admitting: *Deleted

## 2024-06-16 ENCOUNTER — Other Ambulatory Visit (HOSPITAL_COMMUNITY): Payer: Self-pay | Admitting: Interventional Radiology

## 2024-06-16 VITALS — BP 126/72 | HR 80 | Temp 98.4°F | Resp 15 | Wt 201.0 lb

## 2024-06-16 DIAGNOSIS — Z5112 Encounter for antineoplastic immunotherapy: Secondary | ICD-10-CM | POA: Diagnosis present

## 2024-06-16 DIAGNOSIS — Z5111 Encounter for antineoplastic chemotherapy: Secondary | ICD-10-CM | POA: Diagnosis present

## 2024-06-16 DIAGNOSIS — C8108 Nodular lymphocyte predominant Hodgkin lymphoma, lymph nodes of multiple sites: Secondary | ICD-10-CM

## 2024-06-16 DIAGNOSIS — R053 Chronic cough: Secondary | ICD-10-CM | POA: Diagnosis not present

## 2024-06-16 DIAGNOSIS — Z95828 Presence of other vascular implants and grafts: Secondary | ICD-10-CM

## 2024-06-16 DIAGNOSIS — Z452 Encounter for adjustment and management of vascular access device: Secondary | ICD-10-CM | POA: Insufficient documentation

## 2024-06-16 DIAGNOSIS — T82528A Displacement of other cardiac and vascular devices and implants, initial encounter: Secondary | ICD-10-CM | POA: Diagnosis not present

## 2024-06-16 DIAGNOSIS — C8198 Hodgkin lymphoma, unspecified, lymph nodes of multiple sites: Secondary | ICD-10-CM | POA: Diagnosis present

## 2024-06-16 DIAGNOSIS — C8178 Other classical Hodgkin lymphoma, lymph nodes of multiple sites: Secondary | ICD-10-CM | POA: Diagnosis not present

## 2024-06-16 DIAGNOSIS — C8190 Hodgkin lymphoma, unspecified, unspecified site: Secondary | ICD-10-CM | POA: Diagnosis not present

## 2024-06-16 HISTORY — PX: IR RADIOLOGIST EVAL & MGMT: IMG5224

## 2024-06-16 HISTORY — PX: IR CHEST FLUORO: IMG2383

## 2024-06-16 LAB — CMP (CANCER CENTER ONLY)
ALT: 22 U/L (ref 0–44)
AST: 24 U/L (ref 15–41)
Albumin: 3.9 g/dL (ref 3.5–5.0)
Alkaline Phosphatase: 88 U/L (ref 38–126)
Anion gap: 8 (ref 5–15)
BUN: 11 mg/dL (ref 8–23)
CO2: 28 mmol/L (ref 22–32)
Calcium: 9.2 mg/dL (ref 8.9–10.3)
Chloride: 108 mmol/L (ref 98–111)
Creatinine: 1.03 mg/dL (ref 0.61–1.24)
GFR, Estimated: >60 mL/min (ref >60)
Glucose, Bld: 106 mg/dL — ABNORMAL HIGH (ref 70–99)
Potassium: 3.9 mmol/L (ref 3.5–5.1)
Sodium: 144 mmol/L (ref 135–145)
Total Bilirubin: 0.3 mg/dL (ref 0.0–1.2)
Total Protein: 7.4 g/dL (ref 6.5–8.1)

## 2024-06-16 LAB — CBC WITH DIFFERENTIAL (CANCER CENTER ONLY)
Abs Immature Granulocytes: 0.01 K/uL (ref 0.00–0.07)
Basophils Absolute: 0 K/uL (ref 0.0–0.1)
Basophils Relative: 1 %
Eosinophils Absolute: 0.2 K/uL (ref 0.0–0.5)
Eosinophils Relative: 5 %
HCT: 35.2 % — ABNORMAL LOW (ref 39.0–52.0)
Hemoglobin: 11.2 g/dL — ABNORMAL LOW (ref 13.0–17.0)
Immature Granulocytes: 0 %
Lymphocytes Relative: 33 %
Lymphs Abs: 1.2 K/uL (ref 0.7–4.0)
MCH: 26.6 pg (ref 26.0–34.0)
MCHC: 31.8 g/dL (ref 30.0–36.0)
MCV: 83.6 fL (ref 80.0–100.0)
Monocytes Absolute: 0.6 K/uL (ref 0.1–1.0)
Monocytes Relative: 15 %
Neutro Abs: 1.7 K/uL (ref 1.7–7.7)
Neutrophils Relative %: 46 %
Platelet Count: 241 K/uL (ref 150–400)
RBC: 4.21 MIL/uL — ABNORMAL LOW (ref 4.22–5.81)
RDW: 18.3 % — ABNORMAL HIGH (ref 11.5–15.5)
WBC Count: 3.8 K/uL — ABNORMAL LOW (ref 4.0–10.5)
nRBC: 0 % (ref 0.0–0.2)

## 2024-06-16 MED ORDER — SODIUM CHLORIDE 0.9 % IV SOLN
240.0000 mg | Freq: Once | INTRAVENOUS | Status: DC
  Filled 2024-06-16: qty 24

## 2024-06-16 MED ORDER — VINBLASTINE SULFATE CHEMO INJECTION 1 MG/ML
6.0000 mg/m2 | Freq: Once | INTRAVENOUS | Status: DC
  Filled 2024-06-16: qty 12.2

## 2024-06-16 MED ORDER — PALONOSETRON HCL INJECTION 0.25 MG/5ML
0.2500 mg | Freq: Once | INTRAVENOUS | Status: DC
  Filled 2024-06-16: qty 5

## 2024-06-16 MED ORDER — SODIUM CHLORIDE 0.9 % IV SOLN
375.0000 mg/m2 | Freq: Once | INTRAVENOUS | Status: DC
  Filled 2024-06-16: qty 80

## 2024-06-16 MED ORDER — DOXORUBICIN HCL CHEMO IV INJECTION 2 MG/ML
25.0000 mg/m2 | Freq: Once | INTRAVENOUS | Status: DC
  Filled 2024-06-16: qty 26

## 2024-06-16 MED ORDER — SODIUM CHLORIDE 0.9 % IV SOLN
150.0000 mg | Freq: Once | INTRAVENOUS | Status: DC
  Filled 2024-06-16: qty 5

## 2024-06-16 MED ORDER — DEXAMETHASONE SOD PHOSPHATE PF 10 MG/ML IJ SOLN: 10.0000 mg | Freq: Once | INTRAMUSCULAR | Status: DC

## 2024-06-16 MED ORDER — ALTEPLASE 2 MG IJ SOLR
2.0000 mg | Freq: Once | INTRAMUSCULAR | Status: AC
  Administered 2024-06-16: 2 mg
  Filled 2024-06-16: qty 2

## 2024-06-16 MED ORDER — SODIUM CHLORIDE 0.9 % IV SOLN: INTRAVENOUS | Status: DC

## 2024-06-16 NOTE — Progress Notes (Signed)
 Patient for IR Port replacement on Thurs 06/17/24, I called and spoke with the patient's wife, Chase Moreno on the phone and gave pre-procedure instructions. Chase Moreno was made aware to have the patient here at 1:30p, NPO after MN prior to procedure as well as driver post procedure/recovery/discharge. Chase Moreno stated understanding. Called 06/16/24

## 2024-06-16 NOTE — Patient Instructions (Addendum)
 [facility] - A DEPT OF [place]  [facility]  Discharge Instructions: Thank you for choosing [facility] to provide your oncology and hematology care.   If you have a lab appointment with the Cancer Center, please go directly to the Cancer Center and check in at the registration area.   Wear comfortable clothing and clothing appropriate for easy access to any Portacath or PICC line.   We strive to give you quality time with your provider. You may need to reschedule your appointment if you arrive late (15 or more minutes).  Arriving late affects you and other patients whose appointments are after yours.  Also, if you miss three or more appointments without notifying the office, you may be dismissed from the clinic at the provider's discretion.      For prescription refill requests, have your pharmacy contact our office and allow 72 hours for refills to be completed.    Today you received the following chemotherapy and/or immunotherapy agents: N/A  To help prevent nausea and vomiting after your treatment, we encourage you to take your nausea medication as directed.  BELOW ARE SYMPTOMS THAT SHOULD BE REPORTED IMMEDIATELY: *FEVER GREATER THAN 100.4 F (38 C) OR HIGHER *CHILLS OR SWEATING *NAUSEA AND VOMITING THAT IS NOT CONTROLLED WITH YOUR NAUSEA MEDICATION *UNUSUAL SHORTNESS OF BREATH *UNUSUAL BRUISING OR BLEEDING *URINARY PROBLEMS (pain or burning when urinating, or frequent urination) *BOWEL PROBLEMS (unusual diarrhea, constipation, pain near the anus) TENDERNESS IN MOUTH AND THROAT WITH OR WITHOUT PRESENCE OF ULCERS (sore throat, sores in mouth, or a toothache) UNUSUAL RASH, SWELLING OR PAIN  UNUSUAL VAGINAL DISCHARGE OR ITCHING   Items with * indicate a potential emergency and should be followed up as soon as possible or go to the Emergency Department if any problems should occur.  Please show the CHEMOTHERAPY ALERT CARD or IMMUNOTHERAPY ALERT CARD  at check-in to the Emergency Department and triage nurse.  Should you have questions after your visit or need to cancel or reschedule your appointment, please contact [facility]  Dept: [phone]  and follow the prompts.  Office hours are 8:00 a.m. to 4:30 p.m. Monday - Friday. Please note that voicemails left after 4:00 p.m. may not be returned until the following business day.  We are closed weekends and major holidays. You have access to a nurse at all times for urgent questions. Please call the main number to the clinic Dept: [phone] and follow the prompts.   For any non-urgent questions, you may also contact your provider using MyChart. We now offer e-Visits for anyone 4 and older to request care online for non-urgent symptoms. For details visit [email or web address].   Also download the MyChart app! Go to the app store, search MyChart, open the app, select [place], and log in with your MyChart username and password.

## 2024-06-16 NOTE — Telephone Encounter (Signed)
 TCT patient and spoke with both pt and his wife.  Due to his current port being unusable at this time, advised that we have been able to schedule an appt to replace his port tomorrow at [facility] dept with arrival time of 1:30 pm and that we have rescheduled his chemo to 05/19/24 @ 9:30 am. He and his wife are agreeable to this plan. Dr. Federico aware.

## 2024-06-16 NOTE — Progress Notes (Signed)
 [facility] [phone]   [phone]  PROGRESS NOTE   Patient Care Team: Avelina Greig BRAVO, MD as PCP - General Renda Glance, MD as Consulting Physician (Urology) Nieves Cough, MD as Consulting Physician (Urology) Parrett, Madelin RAMAN, NP as Nurse Practitioner (Pulmonary Disease) Elana Montie CROME, RN as Oncology Nurse Navigator Elana Montie CROME, RN as Oncology Nurse Navigator  Hematological/Oncological History # Hodgkin's Lymphoma, Stage IV  04/14/2024: establish care with [facility] clinic 04/22/2024: PET CT scan showed hypermetabolic lymphadenopathy, pulmonary nodules and bone lesions consistent with metastatic disease 04/29/2024: bronchoscopy with lymph node biopsy showed cells morphologically and immunophenotypically consistent with classical  Hodgkin's lymphoma.  05/11/2024: establish care with Dr. Federico  06/03/2024: Cycle 1 Day 1 of Nivo-AVD chemotherapy 06/04/2024: Nivo and Adriamycin  given on Day 2 due to port issues on Day 1  HISTORY OF PRESENTING ILLNESS:  Chase Moreno 73 y.o. male with medical history significant for Hodgkin's lymphoma who presents today for follow-up visit and treatment.  On exam today Mr. Dubin reports he has been well overall since her last visit and tolerated his first cycle of chemotherapy really good.  He did not have any side effect such as nausea, vomiting, or diarrhea.  He denies any runny nose, sore throat, cough or fevers, chills, sweats.  He reports his appetite is good and that his energy is a little lower it is adequate to do his day-to-day tasks.  He is not currently having any pain and has not started to lose his hair.  He denies any lightheadedness, dizziness, shortness of breath.  His cough is also resolved and he is not required any further cough medication.  Overall he feels well today and has no additional questions concerns or complaints.  He is willing and Chase to proceed with treatment  today.  Unfortunately he is continuing to have port issues.  His chemotherapy will be delayed until Friday and a new port we placed tomorrow.  MEDICAL HISTORY:  Past Medical History:  Diagnosis Date   Allergic rhinitis, cause unspecified    Cancer (HCC)    prostate cancer 2018   Diabetes mellitus without complication (HCC)    patient denies although dx in 2017 with a Dr Avelina ; last hgA1c was however 5.5    Unspecified essential hypertension    Unspecified sleep apnea    CPAP machine use     SURGICAL HISTORY: Past Surgical History:  Procedure Laterality Date   arm surgery Left    ENDOBRONCHIAL ULTRASOUND N/A 04/29/2024   Procedure: ENDOBRONCHIAL ULTRASOUND (EBUS);  Surgeon: Kara Dorn NOVAK, MD;  Location: [facility] ENDOSCOPY;  Service: Pulmonary;  Laterality: N/A;   IR CHEST FLUORO  06/16/2024   IR IMAGING GUIDED PORT INSERTION  05/14/2024   IR RADIOLOGIST EVAL & MGMT  06/16/2024   LYMPHADENECTOMY Bilateral 02/17/2017   Procedure: LYMPHADENECTOMY;  Surgeon: Renda Glance, MD;  [facility];  Service: Urology;  Laterality: Bilateral;   ROBOT ASSISTED LAPAROSCOPIC RADICAL PROSTATECTOMY N/A 02/17/2017   Procedure: XI ROBOTIC ASSISTED LAPAROSCOPIC RADICAL PROSTATECTOMY LEVEL 2;  Surgeon: Renda Glance, MD;  Location: [facility];  Service: Urology;  Laterality: N/A;   TONSILLECTOMY     VIDEO BRONCHOSCOPY WITH ENDOBRONCHIAL NAVIGATION N/A 04/29/2024   Procedure: VIDEO BRONCHOSCOPY WITH ENDOBRONCHIAL NAVIGATION;  Surgeon: Kara Dorn NOVAK, MD;  Location: [facility] ENDOSCOPY;  Service: Pulmonary;  Laterality: N/A;    SOCIAL HISTORY: Social History   Socioeconomic History   Marital status: Married    Spouse name: Not on file  Number of children: 2   Years of education: Not on file   Highest education level: Not on file  Occupational History   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  Vaping Use   Vaping status: Never Used  Substance and Sexual Activity   Alcohol use: No    Alcohol/week: 0.0 standard drinks of alcohol   Drug use: No   Sexual activity: Not on file  Other Topics Concern   Not on file  Social History Narrative   Regular exercise---yes, rabbit hunting, walking 2 times  A week.      Diet---eating fruit and veggies, limiting meat.      Widowed: Remarried.         Social Drivers of Corporate Investment Banker Strain: Low Risk  (03/04/2024)   Overall Financial Resource Strain (CARDIA)    Difficulty of Paying Living Expenses: Not hard at all  Food Insecurity: No Food Insecurity (05/11/2024)   Hunger Vital Sign    Worried About Running Out of Food in the Last Year: Never true    Ran Out of Food in the Last Year: Never true  Transportation Needs: No Transportation Needs (05/11/2024)   PRAPARE - Administrator, Civil Service (Medical): No    Lack of Transportation (Non-Medical): No  Physical Activity: Sufficiently Active (03/04/2024)   Exercise Vital Sign    Days of Exercise per Week: 5 days    Minutes of Exercise per Session: 30 min  Stress: No Stress Concern Present (03/04/2024)   [employer] of Occupational Health - Occupational Stress Questionnaire    Feeling of Stress: Not at all  Social Connections: Moderately Integrated (03/04/2024)   Social Connection and Isolation Panel    Frequency of Communication with Friends and Family: More than three times a week    Frequency of Social Gatherings with Friends and Family: More than three times a week    Attends Religious Services: More than 4 times per year    Active Member of [employer] or Organizations: No    Attends Banker Meetings: Never    Marital Status: Married  Catering Manager Violence: Not At Risk (05/11/2024)   Humiliation, Afraid, Rape, and Kick questionnaire    Fear of  Current or Ex-Partner: No    Emotionally Abused: No    Physically Abused: No    Sexually Abused: No    FAMILY HISTORY: Family History  Problem Relation Age of Onset   Arthritis Mother    Arrhythmia Mother    Stroke Paternal Grandmother     ALLERGIES:  has no known allergies.  MEDICATIONS:  Current Outpatient Medications  Medication Sig Dispense Refill   allopurinol  (ZYLOPRIM ) 300 MG tablet Take 1 tablet (300 mg total) by mouth daily. 90 tablet 1   aspirin  EC 81 MG tablet Take 81 mg by mouth daily. Swallow whole.     atorvastatin  (LIPITOR) 10 MG tablet Take 1 tablet (10 mg total) by mouth daily. 90 tablet 3   cholecalciferol (VITAMIN D3) 25 MCG (1000 UNIT) tablet Take 1,000 Units by mouth daily.     fluticasone -salmeterol (ADVAIR DISKUS) 500-50 MCG/ACT AEPB Inhale 1 puff into the lungs in the morning and at bedtime. (Patient not taking: Reported on 05/11/2024) 60 each 6  guaiFENesin -codeine  100-10 MG/5ML syrup Take 10 mLs by mouth every 6 (six) hours as needed for cough. 237 mL 0   lidocaine -prilocaine  (EMLA ) cream Apply 1 Application topically as needed. 30 g 0   losartan  (COZAAR ) 50 MG tablet Take 1 tablet (50 mg total) by mouth daily. 90 tablet 3   naproxen sodium (ALEVE) 220 MG tablet Take 220 mg by mouth daily as needed (for pain).     ondansetron  (ZOFRAN ) 8 MG tablet Take 1 tablet (8 mg total) by mouth every 8 (eight) hours as needed. 30 tablet 0   predniSONE  (DELTASONE ) 20 MG tablet Take 2 tablets (40 mg total) by mouth daily with breakfast. Take for 4 days. 8 tablet 0   prochlorperazine  (COMPAZINE ) 10 MG tablet Take 1 tablet (10 mg total) by mouth every 6 (six) hours as needed for nausea or vomiting. 30 tablet 0   zinc sulfate, 50mg  elemental zinc, 220 (50 Zn) MG capsule Take 220 mg by mouth daily.     No current facility-administered medications for this visit.   Facility-Administered Medications Ordered in Other Visits  Medication Dose Route Frequency Provider Last Rate  Last Admin   DOXOrubicin  (ADRIAMYCIN ) chemo injection 52 mg  25 mg/m2 (Treatment Plan Recorded) Intravenous Once Federico Norleen ONEIDA MADISON, MD   Stopped at 06/03/24 1605    REVIEW OF SYSTEMS:   Constitutional: ( - ) fevers, ( - )  chills , ( - ) night sweats Eyes: ( - ) blurriness of vision, ( - ) double vision, ( - ) watery eyes Ears, nose, mouth, throat, and face: ( - ) mucositis, ( - ) sore throat Respiratory: ( - ) cough, ( - ) dyspnea, ( - ) wheezes Cardiovascular: ( - ) palpitation, ( - ) chest discomfort, ( - ) lower extremity swelling Gastrointestinal:  ( - ) nausea, ( - ) heartburn, ( - ) change in bowel habits Skin: ( - ) abnormal skin rashes Lymphatics: ( - ) new lymphadenopathy, ( - ) easy bruising Neurological: ( - ) numbness, ( - ) tingling, ( - ) new weaknesses Behavioral/Psych: ( - ) mood change, ( - ) new changes  All other systems were reviewed with the patient and are negative.  PHYSICAL EXAMINATION:  Vitals:   06/16/24 1034  BP: 126/72  Pulse: 80  Resp: 15  Temp: 98.4 F (36.9 C)  SpO2: 100%    Filed Weights   06/16/24 1034  Weight: 201 lb (91.2 kg)     GENERAL: well appearing elderly African-American male in NAD  SKIN: skin color, texture, turgor are normal, no rashes or significant lesions EYES: conjunctiva are pink and non-injected, sclera clear LUNGS: clear to auscultation and percussion with normal breathing effort HEART: regular rate & rhythm and no murmurs and no lower extremity edema Musculoskeletal: no cyanosis of digits and no clubbing  PSYCH: alert & oriented x 3, fluent speech NEURO: no focal motor/sensory deficits  LABORATORY DATA:  I have reviewed the data as listed    Latest Ref Rng & Units 06/16/2024    9:53 AM 06/03/2024    1:03 PM 05/11/2024   12:05 PM  CBC  WBC 4.0 - 10.5 K/uL 3.8  24.8  14.3   Hemoglobin 13.0 - 17.0 g/dL 88.7  88.3  88.3   Hematocrit 39.0 - 52.0 % 35.2  35.9  37.0   Platelets 150 - 400 K/uL 241  466  451         Latest Ref Rng & Units  06/16/2024    9:53 AM 06/03/2024    1:03 PM 05/12/2024   12:00 AM  CMP  Glucose 70 - 99 mg/dL 893  892    BUN 8 - 23 mg/dL 11  14    Creatinine 9.38 - 1.24 mg/dL 8.96  8.78  0.8      Sodium 135 - 145 mmol/L 144  139    Potassium 3.5 - 5.1 mmol/L 3.9  4.0    Chloride 98 - 111 mmol/L 108  102    CO2 22 - 32 mmol/L 28  28    Calcium  8.9 - 10.3 mg/dL 9.2  8.8    Total Protein 6.5 - 8.1 g/dL 7.4  7.4    Total Bilirubin 0.0 - 1.2 mg/dL 0.3  0.2    Alkaline Phos 38 - 126 U/L 88  82    AST 15 - 41 U/L 24  18    ALT 0 - 44 U/L 22  11       This result is from an external source.     ASSESSMENT & PLAN Liem Copenhaver 73 y.o. male with medical history significant for sleep apnea with CPAP machine, type 2 diabetes, hypertension, prostate cancer, and allergic rhinitis who presents for evaluation of newly diagnosed Hodgkin's lymphoma.    After review of the labs, review of the records, and discussion with the patient the patients findings are most consistent with a new diagnosis of Hodgkin's lymphoma stage IV.  Today we discussed the diagnosis, prognosis, and treatment moving forward.  He voices understanding of our findings and plan moving forward.  # Hodgkin's Lymphoma, Stage IV  -- Diagnosis confirmed with lymph node biopsy via bronchoscopy as well as PET CT scan. -- Treatment of choice for stage IV Hodgkin's lymphoma would be Nivo-AVD chemotherapy. PLAN: -- Today is cycle 1 day 15 of Nivo AVD.  Will be delayed until 06/18/2024 due to port issues -- Labs today show white blood cell 3.8, hemoglobin 1.2, MCV 83.6, platelets 241. Cr and LFTs WNL.  --plan for repeat PET CT scan after 6 cycles of treatment.  -- Return to clinic for next treatment in 2 weeks time.  #Supportive Care -- chemotherapy education completed -- port placed.  Will be replaced on 06/18/2024 --Echocardiogram adequate for treatment.  -- zofran  8mg  q8H PRN and compazine  10mg  PO q6H for nausea --  allopurinol  300mg  PO daily for TLS prophylaxis -- EMLA  cream for port -- no pain medication required at this time.    No orders of the defined types were placed in this encounter.   All questions were answered. The patient knows to call the clinic with any problems, questions or concerns.  A total of more than 30 minutes were spent on this encounter with face-to-face time and non-face-to-face time, including preparing to see the patient, ordering tests and/or medications, counseling the patient and coordination of care as outlined above.   Norleen IVAR Kidney, MD Department of Hematology/Oncology [facility] Phone: [phone] Pager: [phone] Email: [email or web address]  06/16/2024 2:27 PM

## 2024-06-16 NOTE — Progress Notes (Signed)
 Mr. Eshawn Coor arrived to the cancer center for tx but port did not provide blood return. Cathflo administered by Philippe Birchwood, LPN around 8999. Back in infusion cathflo was removed with adequate blood return around 1112. Port flushed and Chase Moreno started on maintenance NaCl fluid while tx was released. Per Dr. Federico, MD, Chase Moreno is to head to IR for port evaluation before receiving tx.   Tx cancelled for Chase Moreno (06/16/24), deaccessed in IR, and sent home.

## 2024-06-17 ENCOUNTER — Encounter: Payer: Self-pay | Admitting: Radiology

## 2024-06-17 ENCOUNTER — Ambulatory Visit
Admission: RE | Admit: 2024-06-17 | Discharge: 2024-06-17 | Disposition: A | Payer: Medicare (Managed Care) | Source: Ambulatory Visit | Attending: Interventional Radiology | Admitting: Interventional Radiology

## 2024-06-17 VITALS — BP 153/99 | HR 78 | Temp 97.6°F | Resp 20 | Ht 67.0 in | Wt 200.0 lb

## 2024-06-17 DIAGNOSIS — T829XXA Unspecified complication of cardiac and vascular prosthetic device, implant and graft, initial encounter: Secondary | ICD-10-CM | POA: Diagnosis not present

## 2024-06-17 DIAGNOSIS — T82528A Displacement of other cardiac and vascular devices and implants, initial encounter: Secondary | ICD-10-CM | POA: Diagnosis not present

## 2024-06-17 DIAGNOSIS — Z01818 Encounter for other preprocedural examination: Secondary | ICD-10-CM

## 2024-06-17 DIAGNOSIS — C8108 Nodular lymphocyte predominant Hodgkin lymphoma, lymph nodes of multiple sites: Secondary | ICD-10-CM | POA: Diagnosis not present

## 2024-06-17 DIAGNOSIS — Z87891 Personal history of nicotine dependence: Secondary | ICD-10-CM | POA: Diagnosis not present

## 2024-06-17 DIAGNOSIS — C8190 Hodgkin lymphoma, unspecified, unspecified site: Secondary | ICD-10-CM | POA: Diagnosis not present

## 2024-06-17 DIAGNOSIS — Z95828 Presence of other vascular implants and grafts: Secondary | ICD-10-CM

## 2024-06-17 HISTORY — PX: IR REMOVAL TUN ACCESS W/ PORT W/O FL MOD SED: IMG2290

## 2024-06-17 HISTORY — PX: IR IMAGING GUIDED PORT INSERTION: IMG5740

## 2024-06-17 LAB — BASIC METABOLIC PANEL WITH GFR
Anion gap: 9 (ref 5–15)
BUN: 10 mg/dL (ref 8–23)
CO2: 26 mmol/L (ref 22–32)
Calcium: 9 mg/dL (ref 8.9–10.3)
Chloride: 105 mmol/L (ref 98–111)
Creatinine, Ser: 0.9 mg/dL (ref 0.61–1.24)
GFR, Estimated: >60 mL/min (ref >60)
Glucose, Bld: 81 mg/dL (ref 70–99)
Potassium: 4.1 mmol/L (ref 3.5–5.1)
Sodium: 140 mmol/L (ref 135–145)

## 2024-06-17 LAB — CBC
HCT: 36.8 % — ABNORMAL LOW (ref 39.0–52.0)
Hemoglobin: 11.2 g/dL — ABNORMAL LOW (ref 13.0–17.0)
MCH: 26 pg (ref 26.0–34.0)
MCHC: 30.4 g/dL (ref 30.0–36.0)
MCV: 85.6 fL (ref 80.0–100.0)
Platelets: 257 K/uL (ref 150–400)
RBC: 4.3 MIL/uL (ref 4.22–5.81)
RDW: 18 % — ABNORMAL HIGH (ref 11.5–15.5)
WBC: 3.4 K/uL — ABNORMAL LOW (ref 4.0–10.5)
nRBC: 0 % (ref 0.0–0.2)

## 2024-06-17 MED ORDER — LIDOCAINE HCL 1 % IJ SOLN
INTRAMUSCULAR | Status: AC
  Filled 2024-06-17: qty 40

## 2024-06-17 MED ORDER — HEPARIN SOD (PORK) LOCK FLUSH 100 UNIT/ML IV SOLN
500.0000 [IU] | Freq: Once | INTRAVENOUS | Status: AC
  Administered 2024-06-17: 500 [IU] via INTRAVENOUS

## 2024-06-17 MED ORDER — FENTANYL CITRATE (PF) 100 MCG/2ML IJ SOLN
INTRAMUSCULAR | Status: AC
  Filled 2024-06-17: qty 2

## 2024-06-17 MED ORDER — LIDOCAINE HCL 1 % IJ SOLN
30.0000 mL | Freq: Once | INTRAMUSCULAR | Status: AC
  Administered 2024-06-17: 20 mL via INTRADERMAL

## 2024-06-17 MED ORDER — MIDAZOLAM HCL 5 MG/5ML IJ SOLN
INTRAMUSCULAR | Status: AC | PRN
  Administered 2024-06-17: 1 mg via INTRAVENOUS

## 2024-06-17 MED ORDER — MIDAZOLAM HCL 2 MG/2ML IJ SOLN
INTRAMUSCULAR | Status: AC
  Filled 2024-06-17: qty 2

## 2024-06-17 MED ORDER — SODIUM CHLORIDE 0.9 % IV SOLN
INTRAVENOUS | Status: DC
  Administered 2024-06-17: 250 mL via INTRAVENOUS

## 2024-06-17 MED ORDER — FENTANYL CITRATE (PF) 100 MCG/2ML IJ SOLN
INTRAMUSCULAR | Status: AC | PRN
  Administered 2024-06-17 (×2): 50 ug via INTRAVENOUS

## 2024-06-17 MED ORDER — HEPARIN SOD (PORK) LOCK FLUSH 100 UNIT/ML IV SOLN
INTRAVENOUS | Status: AC
  Filled 2024-06-17: qty 5

## 2024-06-17 MED FILL — Fosaprepitant Dimeglumine For IV Infusion 150 MG (Base Eq): INTRAVENOUS | Qty: 5 | Status: AC

## 2024-06-17 NOTE — Discharge Instructions (Signed)
 Implanted [facility] Guide  An implanted port is a type of central line that is placed under the skin. Central lines are used to provide IV access when treatment or nutrition needs to be given through a person's veins. Implanted ports are used for long-term IV access. An implanted port may be placed because: You need IV medicine that would be irritating to the small veins in your hands or arms. You need long-term IV medicines, such as antibiotics. You need IV nutrition for a long period. You need frequent blood draws for lab tests. You need dialysis.   Implanted ports are usually placed in the chest area, but they can also be placed in the upper arm, the abdomen, or the leg. An implanted port has two main parts: Reservoir. The reservoir is round and will appear as a small, raised area under your skin. The reservoir is the part where a needle is inserted to give medicines or draw blood. Catheter. The catheter is a thin, flexible tube that extends from the reservoir. The catheter is placed into a large vein. Medicine that is inserted into the reservoir goes into the catheter and then into the vein.   How will I care for my incision  You may shower tomorrow Please remove dressing in 24hrs not other skin care is needed  How is my port accessed? Special steps must be taken to access the port: Before the port is accessed, a numbing cream can be placed on the skin. This helps numb the skin over the port site. Your health care provider uses a sterile technique to access the port. Your health care provider must put on a mask and sterile gloves. The skin over your port is cleaned carefully with an antiseptic and allowed to dry. The port is gently pinched between sterile gloves, and a needle is inserted into the port. Only "non-coring" port needles should be used to access the port. Once the port is accessed, a blood return should be checked. This helps ensure that the port is in the vein and is not  clogged. If your port needs to remain accessed for a constant infusion, a clear (transparent) bandage will be placed over the needle site. The bandage and needle will need to be changed every week, or as directed by your health care provider.   What is flushing? Flushing helps keep the port from getting clogged. Follow your health care provider's instructions on how and when to flush the port. Ports are usually flushed with saline solution or a medicine called heparin. The need for flushing will depend on how the port is used. If the port is used for intermittent medicines or blood draws, the port will need to be flushed: After medicines have been given. After blood has been drawn. As part of routine maintenance. If a constant infusion is running, the port may not need to be flushed.   How long will my port stay implanted? The port can stay in for as long as your health care provider thinks it is needed. When it is time for the port to come out, surgery will be done to remove it. The procedure is similar to the one performed when the port was put in. When should I seek immediate medical care? When you have an implanted port, you should seek immediate medical care if: You notice a bad smell coming from the incision site. You have swelling, redness, or drainage at the incision site. You have more swelling or pain at the  port site or the surrounding area. You have a fever that is not controlled with medicine.   This information is not intended to replace advice given to you by your health care provider. Make sure you discuss any questions you have with your health care provider. Document Released: 07/01/2005 Document Revised: 12/07/2015 Document Reviewed: 03/08/2013 Elsevier Interactive Patient Education  2017 [employer].

## 2024-06-17 NOTE — H&P (Signed)
 Chief Complaint: Patient was seen in consultation today for malpositioned Port-A-Cath catheter, with consideration for Port-A-Cath exchange.  Referring Provider(s): Dr. Wilkie Lent, MD (internal referral)  Supervising Physician: Jenna Hacker  Patient Status: [facility] - Out-pt  Patient is Full Code  History of Present Illness: Chase Moreno is a 73 y.o. male  with PMHx notable for Hodgkin's lymphoma, HTN, OSA, DM, prostate cancer, allergic rhinitis.  Patient is known to IR service, having most recently undergone Port-A-Cath placement on 05/14/2024 with Dr. Philip.  On 11/14, patient began with a strong and bothersome cough, with transient loss of consciousness at times.  History of priors.  He was prescribed a short course of steroids and codeine  cough suppressant, and cough resolved. On 11/20 and 12/3, patient's port was noted with difficult blood return.  Patient was subsequently seen at [facility] on 12/3, and his port was injected.  IR attending, Dr. Lent noted and malpositioned port catheter at that time, with the catheter tubing displaced superiorly into the right internal jugular vein.  Patient was urgently scheduled for port exchange in IR today.  Patient is alert and laying in bed, calm.  His wife is at the bedside. Patient is currently without any significant complaints.  Overall, he feels well. Patient denies any fevers, headache, chest pain, SOB, cough, abdominal pain, nausea, vomiting or bleeding.    Past Medical History:  Diagnosis Date   Allergic rhinitis, cause unspecified    Cancer (HCC)    prostate cancer 2018   Diabetes mellitus without complication [facility])    patient denies although dx in 2017 with a Dr Avelina ; last hgA1c was however 5.5    Unspecified essential hypertension    Unspecified sleep apnea    CPAP machine use     Past Surgical History:  Procedure Laterality Date   arm surgery Left    ENDOBRONCHIAL ULTRASOUND N/A 04/29/2024    Procedure: ENDOBRONCHIAL ULTRASOUND (EBUS);  Surgeon: Kara Dorn NOVAK, MD;  Location: [facility] ENDOSCOPY;  Service: Pulmonary;  Laterality: N/A;   IR CHEST FLUORO  06/16/2024   IR IMAGING GUIDED PORT INSERTION  05/14/2024   IR RADIOLOGIST EVAL & MGMT  06/16/2024   LYMPHADENECTOMY Bilateral 02/17/2017   Procedure: LYMPHADENECTOMY;  Surgeon: Renda Glance, MD;  Location: [facility] ORS;  Service: Urology;  Laterality: Bilateral;   ROBOT ASSISTED LAPAROSCOPIC RADICAL PROSTATECTOMY N/A 02/17/2017   Procedure: XI ROBOTIC ASSISTED LAPAROSCOPIC RADICAL PROSTATECTOMY LEVEL 2;  Surgeon: Renda Glance, MD;  Location: [facility] ORS;  Service: Urology;  Laterality: N/A;   TONSILLECTOMY     VIDEO BRONCHOSCOPY WITH ENDOBRONCHIAL NAVIGATION N/A 04/29/2024   Procedure: VIDEO BRONCHOSCOPY WITH ENDOBRONCHIAL NAVIGATION;  Surgeon: Kara Dorn NOVAK, MD;  Location: [facility] ENDOSCOPY;  Service: Pulmonary;  Laterality: N/A;    Allergies: Patient has no known allergies.  Medications: Prior to Admission medications   Medication Sig Start Date End Date Taking? Authorizing Provider  allopurinol  (ZYLOPRIM ) 300 MG tablet Take 1 tablet (300 mg total) by mouth daily. 05/11/24  Yes Federico Norleen ONEIDA MADISON, MD  atorvastatin  (LIPITOR) 10 MG tablet Take 1 tablet (10 mg total) by mouth daily. 12/25/23  Yes Bedsole, Amy E, MD  cholecalciferol (VITAMIN D3) 25 MCG (1000 UNIT) tablet Take 1,000 Units by mouth daily.   Yes [provider]  losartan  (COZAAR ) 50 MG tablet Take 1 tablet (50 mg total) by mouth daily. 12/25/23  Yes Bedsole, Amy E, MD  zinc sulfate, 50mg  elemental zinc, 220 (50 Zn) MG capsule Take 220 mg  by mouth daily.   Yes [provider]  aspirin  EC 81 MG tablet Take 81 mg by mouth daily. Swallow whole.    [provider]  fluticasone -salmeterol (ADVAIR DISKUS) 500-50 MCG/ACT AEPB Inhale 1 puff into the lungs in the morning and at bedtime. Patient not taking: Reported on 05/11/2024 04/16/24   Zaida Zola SAILOR, MD   guaiFENesin -codeine  100-10 MG/5ML syrup Take 10 mLs by mouth every 6 (six) hours as needed for cough. 05/28/24   Dorsey, John T IV, MD  lidocaine -prilocaine  (EMLA ) cream Apply 1 Application topically as needed. 05/11/24   Dorsey, John T IV, MD  naproxen sodium (ALEVE) 220 MG tablet Take 220 mg by mouth daily as needed (for pain).    [provider]  ondansetron  (ZOFRAN ) 8 MG tablet Take 1 tablet (8 mg total) by mouth every 8 (eight) hours as needed. 05/11/24   Federico Norleen ONEIDA MADISON, MD  predniSONE  (DELTASONE ) 20 MG tablet Take 2 tablets (40 mg total) by mouth daily with breakfast. Take for 4 days. 05/28/24   Dorsey, John T IV, MD  prochlorperazine  (COMPAZINE ) 10 MG tablet Take 1 tablet (10 mg total) by mouth every 6 (six) hours as needed for nausea or vomiting. 05/11/24   Federico Norleen ONEIDA MADISON, MD     Family History  Problem Relation Age of Onset   Arthritis Mother    Arrhythmia Mother    Stroke Paternal Grandmother     Social History   Socioeconomic History   Marital status: Married    Spouse name: Not on file   Number of children: 2   Years of education: Not on file   Highest education level: Not on file  Occupational History   Occupation: machine shop    Comment: [employer]  Tobacco Use   Smoking status: Former   Smokeless tobacco: Never   Tobacco comments:    quit over 40 years ago    Smoked for about 8-10 years, about 1 pack per week.  Vaping Use   Vaping status: Never Used  Substance and Sexual Activity   Alcohol use: No    Alcohol/week: 0.0 standard drinks of alcohol   Drug use: No   Sexual activity: Not on file  Other Topics Concern   Not on file  Social History Narrative   Regular exercise---yes, rabbit hunting, walking 2 times  A week.      Diet---eating fruit and veggies, limiting meat.      Widowed: Remarried.         Social Drivers of Corporate Investment Banker Strain: Low Risk  (03/04/2024)   Overall Financial Resource Strain (CARDIA)     Difficulty of Paying Living Expenses: Not hard at all  Food Insecurity: No Food Insecurity (05/11/2024)   Hunger Vital Sign    Worried About Running Out of Food in the Last Year: Never true    Ran Out of Food in the Last Year: Never true  Transportation Needs: No Transportation Needs (05/11/2024)   PRAPARE - Administrator, Civil Service (Medical): No    Lack of Transportation (Non-Medical): No  Physical Activity: Sufficiently Active (03/04/2024)   Exercise Vital Sign    Days of Exercise per Week: 5 days    Minutes of Exercise per Session: 30 min  Stress: No Stress Concern Present (03/04/2024)   [employer] of Occupational Health - Occupational Stress Questionnaire    Feeling of Stress: Not at all  Social Connections: Moderately Integrated (03/04/2024)  Social Connection and Isolation Panel    Frequency of Communication with Friends and Family: More than three times a week    Frequency of Social Gatherings with Friends and Family: More than three times a week    Attends Religious Services: More than 4 times per year    Active Member of [employer] or Organizations: No    Attends Banker Meetings: Never    Marital Status: Married     Review of Systems: A 12 point ROS discussed and pertinent positives are indicated in the HPI above.  All other systems are negative.  Vital Signs: BP 128/81   Pulse 67   Temp (!) 97.5 F (36.4 C) (Oral)   Resp 15   Ht 5' 7 (1.702 m)   Wt 200 lb (90.7 kg)   SpO2 98%   BMI 31.32 kg/m   Advance Care Plan: The advanced care place/surrogate decision maker was discussed at the time of visit and the patient did not wish to discuss or was not able to name a surrogate decision maker or provide an advance care plan.  Physical Exam Vitals reviewed.  Constitutional:      General: He is not in acute distress.    Appearance: Normal appearance.  HENT:     Mouth/Throat:     Mouth: Mucous membranes are dry.  Neck:      Comments: No tenderness to palpation. Cardiovascular:     Rate and Rhythm: Normal rate and regular rhythm.     Pulses: Normal pulses.     Heart sounds: Murmur heard.  Pulmonary:     Effort: Pulmonary effort is normal.     Breath sounds: Normal breath sounds.  Abdominal:     General: Abdomen is flat.  Musculoskeletal:        General: Normal range of motion.     Cervical back: Normal range of motion.  Skin:    General: Skin is warm and dry.  Neurological:     Mental Status: He is alert and oriented to person, place, and time.  Psychiatric:        Mood and Affect: Mood normal.        Behavior: Behavior normal.        Thought Content: Thought content normal.        Judgment: Judgment normal.     Imaging: IR Radiologist Eval & Mgmt Result Date: 06/16/2024 INDICATION: 73 year old male with Hodgkin's lymphoma. Intervention radiology placed a port catheter recently on 05/14/2024. The chemotherapy infusion center is having difficulty with aspiration and flushing and so patient presents for catheter check and evaluation. EXAM: CHEST FLUOROSCOPY; IR EVAL AND MANAGEMENT MEDICATIONS: None. ANESTHESIA/SEDATION: None. FLUOROSCOPY TIME:  Radiation exposure index: 1 mGy, air kerma COMPLICATIONS: None immediate. PROCEDURE: The patient was placed on the interventional radiology table in fluoroscopy was used to evaluate the port. A Bard Clear Vue power injectable port catheter is present in the right chest. Unfortunately, the catheter tubing has displaced superiorly into the internal jugular vein. Findings were discussed at length in person with the patient. He describes recent episodes of severe coughing or he coughs OR he passed out. This likely created changes in the intrathoracic pressure and blood flow resulting in displacement of the catheter tubing. He understands that the port catheter is currently not usable. We discussed options including attempted snaring and repositioning of the catheter. However,  the catheter tubing often develops some memory and may be resistant to repositioning or have a tendency to prolapse back  into the internal jugular vein. He would likely be best served by full port catheter revision. He would like to return on another date to have this procedure performed with moderate sedation. IMPRESSION: Malposition port catheter. The catheter tubing has displaced superiorly into the right internal jugular vein. The patient will be scheduled to return to interventional radiology for port catheter revision under moderate sedation. Electronically Signed   By: Wilkie Lent M.D.   On: 06/16/2024 14:23   IR Chest Fluoro Result Date: 06/16/2024 INDICATION: 73 year old male with Hodgkin's lymphoma. Intervention radiology placed a port catheter recently on 05/14/2024. The chemotherapy infusion center is having difficulty with aspiration and flushing and so patient presents for catheter check and evaluation. EXAM: CHEST FLUOROSCOPY; IR EVAL AND MANAGEMENT MEDICATIONS: None. ANESTHESIA/SEDATION: None. FLUOROSCOPY TIME:  Radiation exposure index: 1 mGy, air kerma COMPLICATIONS: None immediate. PROCEDURE: The patient was placed on the interventional radiology table in fluoroscopy was used to evaluate the port. A Bard Clear Vue power injectable port catheter is present in the right chest. Unfortunately, the catheter tubing has displaced superiorly into the internal jugular vein. Findings were discussed at length in person with the patient. He describes recent episodes of severe coughing or he coughs OR he passed out. This likely created changes in the intrathoracic pressure and blood flow resulting in displacement of the catheter tubing. He understands that the port catheter is currently not usable. We discussed options including attempted snaring and repositioning of the catheter. However, the catheter tubing often develops some memory and may be resistant to repositioning or have a tendency to  prolapse back into the internal jugular vein. He would likely be best served by full port catheter revision. He would like to return on another date to have this procedure performed with moderate sedation. IMPRESSION: Malposition port catheter. The catheter tubing has displaced superiorly into the right internal jugular vein. The patient will be scheduled to return to interventional radiology for port catheter revision under moderate sedation. Electronically Signed   By: Wilkie Lent M.D.   On: 06/16/2024 14:23    Labs:  CBC: Recent Labs    05/11/24 1205 06/03/24 1303 06/16/24 0953 06/17/24 1356  WBC 14.3* 24.8* 3.8* 3.4*  HGB 11.6* 11.6* 11.2* 11.2*  HCT 37.0* 35.9* 35.2* 36.8*  PLT 451* 466* 241 257    COAGS: Recent Labs    12/21/23 1644  INR 1.2    BMP: Recent Labs    04/14/24 1348 05/11/24 1205 05/12/24 0000 06/03/24 1303 06/16/24 0953  NA 139 141  --  139 144  K 4.2 4.0  --  4.0 3.9  CL 106 106  --  102 108  CO2 27 30  --  28 28  GLUCOSE 107* 79  --  107* 106*  BUN 22 9  --  14 11  CALCIUM  8.7* 9.0  --  8.8* 9.2  CREATININE 1.06 1.06 0.8 1.21 1.03  GFRNONAA >60 >60  --  >60 >60    LIVER FUNCTION TESTS: Recent Labs    04/14/24 1348 05/11/24 1205 06/03/24 1303 06/16/24 0953  BILITOT 0.3 0.3 0.2 0.3  AST 19 15 18 24   ALT 26 10 11 22   ALKPHOS 80 87 82 88  PROT 8.0 8.0 7.4 7.4  ALBUMIN 3.6 3.8 3.4* 3.9    TUMOR MARKERS: No results for input(s): AFPTM, CEA, CA199, CHROMGRNA in the last 8760 hours.  Assessment and Plan: Patient is known to IR service, having most recently undergone Port-A-Cath placement on 05/14/2024  with Dr. Philip.  On 11/14, patient began with a strong and bothersome cough, with transient loss of consciousness at times.  History of priors.  He was prescribed a short course of steroids and codeine  cough suppressant, and cough resolved. On 11/20 and 12/3, patient's port was noted with difficult blood return.  Patient was  subsequently seen at [facility] on 12/3, and his port was injected.  IR attending, Dr. Karalee noted and malpositioned port catheter at that time, with the catheter tubing displaced superiorly into the right internal jugular vein.    Patient presents for urgently scheduled Port-A-Cath exchange in IR today.  Patient has been NPO since midnight in anticipation of moderate sedation. All labs and medications are within acceptable parameters.  No allergies noted on file.  Risks and benefits of image guided port-a-catheter exchange was discussed with the patient including, but not limited to bleeding, infection, pneumothorax, or fibrin sheath development and need for additional procedures.  All of the patient's questions were answered, patient is agreeable to proceed. Consent signed and in chart.    Thank you for allowing our service to participate in Chase Moreno 's care.  Electronically Signed: Carlin DELENA Griffon, PA-C   06/17/2024, 2:27 PM      I spent a total of 30 Minutes in face to face in clinical consultation, greater than 50% of which was counseling/coordinating care for malpositioned Port-A-Cath catheter, with consideration for Port-A-Cath exchange.

## 2024-06-17 NOTE — Procedures (Signed)
 Interventional Radiology Procedure Note  Procedure: Chest Port placement  Complications: None  Estimated Blood Loss: < 10 mL  Findings: Left jugular vein access for placement of a SL chest port.  Catheter tip at cavoatrial junction.  Right port removed.  Cordella DELENA Banner, MD

## 2024-06-18 ENCOUNTER — Other Ambulatory Visit: Payer: Self-pay | Admitting: Interventional Radiology

## 2024-06-18 ENCOUNTER — Encounter: Payer: Self-pay | Admitting: Hematology and Oncology

## 2024-06-18 ENCOUNTER — Inpatient Hospital Stay: Payer: Medicare (Managed Care)

## 2024-06-18 ENCOUNTER — Other Ambulatory Visit (HOSPITAL_COMMUNITY): Payer: Self-pay | Admitting: Interventional Radiology

## 2024-06-18 VITALS — BP 132/76 | HR 96 | Temp 97.3°F | Resp 18 | Wt 200.8 lb

## 2024-06-18 DIAGNOSIS — Z95828 Presence of other vascular implants and grafts: Secondary | ICD-10-CM

## 2024-06-18 DIAGNOSIS — C8108 Nodular lymphocyte predominant Hodgkin lymphoma, lymph nodes of multiple sites: Secondary | ICD-10-CM

## 2024-06-18 DIAGNOSIS — Z5112 Encounter for antineoplastic immunotherapy: Secondary | ICD-10-CM | POA: Diagnosis not present

## 2024-06-18 MED ORDER — SODIUM CHLORIDE 0.9 % IV SOLN: INTRAVENOUS | Status: DC

## 2024-06-18 MED ORDER — DEXAMETHASONE SOD PHOSPHATE PF 10 MG/ML IJ SOLN
10.0000 mg | Freq: Once | INTRAMUSCULAR | Status: AC
  Administered 2024-06-18: 10 mg via INTRAVENOUS

## 2024-06-18 MED ORDER — SODIUM CHLORIDE 0.9 % IV SOLN
375.0000 mg/m2 | Freq: Once | INTRAVENOUS | Status: AC
  Administered 2024-06-18: 800 mg via INTRAVENOUS
  Filled 2024-06-18: qty 80

## 2024-06-18 MED ORDER — VINBLASTINE SULFATE CHEMO INJECTION 1 MG/ML
6.0000 mg/m2 | Freq: Once | INTRAVENOUS | Status: AC
  Administered 2024-06-18: 12.2 mg via INTRAVENOUS
  Filled 2024-06-18: qty 12.2

## 2024-06-18 MED ORDER — SODIUM CHLORIDE 0.9 % IV SOLN
240.0000 mg | Freq: Once | INTRAVENOUS | Status: AC
  Administered 2024-06-18: 240 mg via INTRAVENOUS
  Filled 2024-06-18: qty 24

## 2024-06-18 MED ORDER — DOXORUBICIN HCL CHEMO IV INJECTION 2 MG/ML
25.0000 mg/m2 | Freq: Once | INTRAVENOUS | Status: AC
  Administered 2024-06-18: 52 mg via INTRAVENOUS
  Filled 2024-06-18: qty 26

## 2024-06-18 MED ORDER — PALONOSETRON HCL INJECTION 0.25 MG/5ML
0.2500 mg | Freq: Once | INTRAVENOUS | Status: AC
  Administered 2024-06-18: 0.25 mg via INTRAVENOUS
  Filled 2024-06-18: qty 5

## 2024-06-18 MED ORDER — SODIUM CHLORIDE 0.9 % IV SOLN
150.0000 mg | Freq: Once | INTRAVENOUS | Status: AC
  Administered 2024-06-18: 150 mg via INTRAVENOUS
  Filled 2024-06-18: qty 150

## 2024-06-18 NOTE — Progress Notes (Signed)
 New left-sided port tip location: CAJ per note from Cordella RONAL Banner, MD on 06/17/2024.   Pt put numbing cream on over new left-sided port which made incision site tacky. Numbing cream removed carefully and site assessed- incision still intact. Elenor Blush, RN assessed and added steri-strips over site then accessed port with no issues. Pt and wife educated to not use numbing cream for two weeks to keep glue intact.

## 2024-06-18 NOTE — Patient Instructions (Signed)
 [facility] - A DEPT OF [facility]  Discharge Instructions: Thank you for choosing [facility] to provide your oncology and hematology care.   If you have a lab appointment with the Cancer Center, please go directly to the Cancer Center and check in at the registration area.   Wear comfortable clothing and clothing appropriate for easy access to any Portacath or PICC line.   We strive to give you quality time with your provider. You may need to reschedule your appointment if you arrive late (15 or more minutes).  Arriving late affects you and other patients whose appointments are after yours.  Also, if you miss three or more appointments without notifying the office, you may be dismissed from the clinic at the provider's discretion.      For prescription refill requests, have your pharmacy contact our office and allow 72 hours for refills to be completed.    Today you received the following chemotherapy and/or immunotherapy agents: Doxorubicin  (Adriamycin ), Vinblastine  (Velban ), Dacarbazine  (DTIC), & Nivolumab  (Opdivo )    To help prevent nausea and vomiting after your treatment, we encourage you to take your nausea medication as directed.  BELOW ARE SYMPTOMS THAT SHOULD BE REPORTED IMMEDIATELY: *FEVER GREATER THAN 100.4 F (38 C) OR HIGHER *CHILLS OR SWEATING *NAUSEA AND VOMITING THAT IS NOT CONTROLLED WITH YOUR NAUSEA MEDICATION *UNUSUAL SHORTNESS OF BREATH *UNUSUAL BRUISING OR BLEEDING *URINARY PROBLEMS (pain or burning when urinating, or frequent urination) *BOWEL PROBLEMS (unusual diarrhea, constipation, pain near the anus) TENDERNESS IN MOUTH AND THROAT WITH OR WITHOUT PRESENCE OF ULCERS (sore throat, sores in mouth, or a toothache) UNUSUAL RASH, SWELLING OR PAIN  UNUSUAL VAGINAL DISCHARGE OR ITCHING   Items with * indicate a potential emergency and should be followed up as soon as possible or go to the Emergency Department if any  problems should occur.  Please show the CHEMOTHERAPY ALERT CARD or IMMUNOTHERAPY ALERT CARD at check-in to the Emergency Department and triage nurse.  Should you have questions after your visit or need to cancel or reschedule your appointment, please contact [facility] - A DEPT OF [facility]  Dept: [phone]  and follow the prompts.  Office hours are 8:00 a.m. to 4:30 p.m. Monday - Friday. Please note that voicemails left after 4:00 p.m. may not be returned until the following business day.  We are closed weekends and major holidays. You have access to a nurse at all times for urgent questions. Please call the main number to the clinic Dept: [phone] and follow the prompts.   For any non-urgent questions, you may also contact your provider using MyChart. We now offer e-Visits for anyone 53 and older to request care online for non-urgent symptoms. For details visit [email or web address].   Also download the MyChart app! Go to the app store, search MyChart, open the app, select [place], and log in with your MyChart username and password.

## 2024-07-02 ENCOUNTER — Inpatient Hospital Stay: Payer: Medicare (Managed Care)

## 2024-07-02 ENCOUNTER — Inpatient Hospital Stay: Payer: Medicare (Managed Care) | Admitting: Hematology and Oncology

## 2024-07-02 ENCOUNTER — Encounter: Payer: Self-pay | Admitting: *Deleted

## 2024-07-02 VITALS — BP 143/79 | HR 84 | Temp 97.9°F | Resp 19 | Wt 206.7 lb

## 2024-07-02 DIAGNOSIS — C8108 Nodular lymphocyte predominant Hodgkin lymphoma, lymph nodes of multiple sites: Secondary | ICD-10-CM

## 2024-07-02 DIAGNOSIS — Z5112 Encounter for antineoplastic immunotherapy: Secondary | ICD-10-CM | POA: Diagnosis not present

## 2024-07-02 LAB — CBC WITH DIFFERENTIAL (CANCER CENTER ONLY)
Abs Immature Granulocytes: 0.06 K/uL (ref 0.00–0.07)
Basophils Absolute: 0 K/uL (ref 0.0–0.1)
Basophils Relative: 1 %
Eosinophils Absolute: 0.1 K/uL (ref 0.0–0.5)
Eosinophils Relative: 4 %
HCT: 34.9 % — ABNORMAL LOW (ref 39.0–52.0)
Hemoglobin: 11.4 g/dL — ABNORMAL LOW (ref 13.0–17.0)
Immature Granulocytes: 2 %
Lymphocytes Relative: 47 %
Lymphs Abs: 1.3 K/uL (ref 0.7–4.0)
MCH: 27 pg (ref 26.0–34.0)
MCHC: 32.7 g/dL (ref 30.0–36.0)
MCV: 82.7 fL (ref 80.0–100.0)
Monocytes Absolute: 0.7 K/uL (ref 0.1–1.0)
Monocytes Relative: 24 %
Neutro Abs: 0.6 K/uL — ABNORMAL LOW (ref 1.7–7.7)
Neutrophils Relative %: 22 %
Platelet Count: 254 K/uL (ref 150–400)
RBC: 4.22 MIL/uL (ref 4.22–5.81)
RDW: 17.7 % — ABNORMAL HIGH (ref 11.5–15.5)
Smear Review: NORMAL
WBC Count: 2.8 K/uL — ABNORMAL LOW (ref 4.0–10.5)
nRBC: 0 % (ref 0.0–0.2)

## 2024-07-02 LAB — CMP (CANCER CENTER ONLY)
ALT: 14 U/L (ref 0–44)
AST: 27 U/L (ref 15–41)
Albumin: 4 g/dL (ref 3.5–5.0)
Alkaline Phosphatase: 77 U/L (ref 38–126)
Anion gap: 10 (ref 5–15)
BUN: 13 mg/dL (ref 8–23)
CO2: 25 mmol/L (ref 22–32)
Calcium: 8.8 mg/dL — ABNORMAL LOW (ref 8.9–10.3)
Chloride: 108 mmol/L (ref 98–111)
Creatinine: 1.31 mg/dL — ABNORMAL HIGH (ref 0.61–1.24)
GFR, Estimated: 57 mL/min — ABNORMAL LOW
Glucose, Bld: 131 mg/dL — ABNORMAL HIGH (ref 70–99)
Potassium: 3.6 mmol/L (ref 3.5–5.1)
Sodium: 142 mmol/L (ref 135–145)
Total Bilirubin: <0.2 mg/dL (ref 0.0–1.2)
Total Protein: 7.1 g/dL (ref 6.5–8.1)

## 2024-07-02 MED ORDER — VINBLASTINE SULFATE CHEMO INJECTION 1 MG/ML
6.0000 mg/m2 | Freq: Once | INTRAVENOUS | Status: AC
  Administered 2024-07-02: 12.2 mg via INTRAVENOUS
  Filled 2024-07-02: qty 12.2

## 2024-07-02 MED ORDER — DOXORUBICIN HCL CHEMO IV INJECTION 2 MG/ML
25.0000 mg/m2 | Freq: Once | INTRAVENOUS | Status: AC
  Administered 2024-07-02: 52 mg via INTRAVENOUS
  Filled 2024-07-02: qty 26

## 2024-07-02 MED ORDER — SODIUM CHLORIDE 0.9 % IV SOLN: INTRAVENOUS | Status: DC

## 2024-07-02 MED ORDER — DEXAMETHASONE SOD PHOSPHATE PF 10 MG/ML IJ SOLN
10.0000 mg | Freq: Once | INTRAMUSCULAR | Status: AC
  Administered 2024-07-02: 10 mg via INTRAVENOUS

## 2024-07-02 MED ORDER — PALONOSETRON HCL INJECTION 0.25 MG/5ML
0.2500 mg | Freq: Once | INTRAVENOUS | Status: AC
  Administered 2024-07-02: 0.25 mg via INTRAVENOUS
  Filled 2024-07-02: qty 5

## 2024-07-02 MED ORDER — SODIUM CHLORIDE 0.9 % IV SOLN
150.0000 mg | Freq: Once | INTRAVENOUS | Status: AC
  Administered 2024-07-02: 150 mg via INTRAVENOUS
  Filled 2024-07-02: qty 150

## 2024-07-02 MED ORDER — SODIUM CHLORIDE 0.9 % IV SOLN
375.0000 mg/m2 | Freq: Once | INTRAVENOUS | Status: AC
  Administered 2024-07-02: 800 mg via INTRAVENOUS
  Filled 2024-07-02: qty 80

## 2024-07-02 MED ORDER — SODIUM CHLORIDE 0.9 % IV SOLN
240.0000 mg | Freq: Once | INTRAVENOUS | Status: AC
  Administered 2024-07-02: 240 mg via INTRAVENOUS
  Filled 2024-07-02: qty 24

## 2024-07-02 NOTE — Patient Instructions (Signed)
 [facility] - A DEPT OF [facility]  Discharge Instructions: Thank you for choosing [facility] to provide your oncology and hematology care.   If you have a lab appointment with the Cancer Center, please go directly to the Cancer Center and check in at the registration area.   Wear comfortable clothing and clothing appropriate for easy access to any Portacath or PICC line.   We strive to give you quality time with your provider. You may need to reschedule your appointment if you arrive late (15 or more minutes).  Arriving late affects you and other patients whose appointments are after yours.  Also, if you miss three or more appointments without notifying the office, you may be dismissed from the clinic at the provider's discretion.      For prescription refill requests, have your pharmacy contact our office and allow 72 hours for refills to be completed.    Today you received the following chemotherapy and/or immunotherapy agents: Doxorubicin  (Adriamycin ), Vinblastine  (Velban ), Dacarbazine  (DTIC), & Nivolumab  (Opdivo )    To help prevent nausea and vomiting after your treatment, we encourage you to take your nausea medication as directed.  BELOW ARE SYMPTOMS THAT SHOULD BE REPORTED IMMEDIATELY: *FEVER GREATER THAN 100.4 F (38 C) OR HIGHER *CHILLS OR SWEATING *NAUSEA AND VOMITING THAT IS NOT CONTROLLED WITH YOUR NAUSEA MEDICATION *UNUSUAL SHORTNESS OF BREATH *UNUSUAL BRUISING OR BLEEDING *URINARY PROBLEMS (pain or burning when urinating, or frequent urination) *BOWEL PROBLEMS (unusual diarrhea, constipation, pain near the anus) TENDERNESS IN MOUTH AND THROAT WITH OR WITHOUT PRESENCE OF ULCERS (sore throat, sores in mouth, or a toothache) UNUSUAL RASH, SWELLING OR PAIN  UNUSUAL VAGINAL DISCHARGE OR ITCHING   Items with * indicate a potential emergency and should be followed up as soon as possible or go to the Emergency Department if any  problems should occur.  Please show the CHEMOTHERAPY ALERT CARD or IMMUNOTHERAPY ALERT CARD at check-in to the Emergency Department and triage nurse.  Should you have questions after your visit or need to cancel or reschedule your appointment, please contact [facility] - A DEPT OF [facility]  Dept: [phone]  and follow the prompts.  Office hours are 8:00 a.m. to 4:30 p.m. Monday - Friday. Please note that voicemails left after 4:00 p.m. may not be returned until the following business day.  We are closed weekends and major holidays. You have access to a nurse at all times for urgent questions. Please call the main number to the clinic Dept: [phone] and follow the prompts.   For any non-urgent questions, you may also contact your provider using MyChart. We now offer e-Visits for anyone 53 and older to request care online for non-urgent symptoms. For details visit [email or web address].   Also download the MyChart app! Go to the app store, search MyChart, open the app, select [place], and log in with your MyChart username and password.

## 2024-07-02 NOTE — Progress Notes (Signed)
 " [facility] [phone]   [phone]  PROGRESS NOTE   Patient Care Team: Avelina Greig BRAVO, MD as PCP - General Renda Glance, MD as Consulting Physician (Urology) Nieves Cough, MD as Consulting Physician (Urology) Parrett, Madelin RAMAN, NP as Nurse Practitioner (Pulmonary Disease) Elana Montie CROME, RN as Oncology Nurse Navigator Elana Montie CROME, RN as Oncology Nurse Navigator  Hematological/Oncological History # Hodgkin's Lymphoma, Stage IV  04/14/2024: establish care with [facility] 04/22/2024: PET CT scan showed hypermetabolic lymphadenopathy, pulmonary nodules and bone lesions consistent with metastatic disease 04/29/2024: bronchoscopy with lymph node biopsy showed cells morphologically and immunophenotypically consistent with classical  Hodgkin's lymphoma.  05/11/2024: establish care with Dr. Federico  06/03/2024: Cycle 1 Day 1 of Nivo-AVD chemotherapy 06/04/2024: Nivo and Adriamycin  given on Day 2 due to port issues on Day 1 07/02/2024: Cycle 2 Day 1 of Nivo-AVD chemotherapy  HISTORY OF PRESENTING ILLNESS:  Chase Moreno 73 y.o. male with medical history significant for Hodgkin's lymphoma who presents today for follow-up visit and treatment.  On exam today Chase Moreno reports he has tolerated his first 2 treatments very well.  He said no issues with nausea, vomiting, or diarrhea.  His appetite is quite strong and in fact his weight is increasing.  He is up 5 pounds for last visit.  His energy levels remain good.  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  He notes he is had no issues with runny nose, sore throat, cough.  He denies any fevers, chills, sweats.  He has been having some itching but no other major side effects.  He reports that these itching predominantly occurs at night.  It can be anywhere on his body.  He reports that he has had no trouble with bleeding, bruising, or dark stools.  Overall he feels well and is willing and  able to proceed with his third treatment today.  A full 10 point ROS is otherwise negative.  MEDICAL HISTORY:  Past Medical History:  Diagnosis Date   Allergic rhinitis, cause unspecified    Cancer (HCC)    prostate cancer 2018   Diabetes mellitus without complication (HCC)    patient denies although dx in 2017 with a Dr Avelina ; last hgA1c was however 5.5    Unspecified essential hypertension    Unspecified sleep apnea    CPAP machine use     SURGICAL HISTORY: Past Surgical History:  Procedure Laterality Date   arm surgery Left    ENDOBRONCHIAL ULTRASOUND N/A 04/29/2024   Procedure: ENDOBRONCHIAL ULTRASOUND (EBUS);  Surgeon: Kara Dorn NOVAK, MD;  Location: [facility] ENDOSCOPY;  Service: Pulmonary;  Laterality: N/A;   IR CHEST FLUORO  06/16/2024   IR IMAGING GUIDED PORT INSERTION  05/14/2024   IR IMAGING GUIDED PORT INSERTION  06/17/2024   IR RADIOLOGIST EVAL & MGMT  06/16/2024   IR REMOVAL TUN ACCESS W/ PORT W/O FL MOD SED  06/17/2024   LYMPHADENECTOMY Bilateral 02/17/2017   Procedure: LYMPHADENECTOMY;  Surgeon: Renda Glance, MD;  Location: [facility] ORS;  Service: Urology;  Laterality: Bilateral;   ROBOT ASSISTED LAPAROSCOPIC RADICAL PROSTATECTOMY N/A 02/17/2017   Procedure: XI ROBOTIC ASSISTED LAPAROSCOPIC RADICAL PROSTATECTOMY LEVEL 2;  Surgeon: Renda Glance, MD;  Location: [facility] ORS;  Service: Urology;  Laterality: N/A;   TONSILLECTOMY     VIDEO BRONCHOSCOPY WITH ENDOBRONCHIAL NAVIGATION N/A 04/29/2024   Procedure: VIDEO BRONCHOSCOPY WITH ENDOBRONCHIAL NAVIGATION;  Surgeon: Kara Dorn NOVAK, MD;  Location: [facility] ENDOSCOPY;  Service: Pulmonary;  Laterality:  N/A;    SOCIAL HISTORY: Social History   Socioeconomic History   Marital status: Married    Spouse name: Not on file   Number of children: 2   Years of education: Not on file   Highest education level: Not on file  Occupational History   Occupation: machine shop    Comment: [employer]  Tobacco Use   Smoking status:  Former   Smokeless tobacco: Never   Tobacco comments:    quit over 40 years ago    Smoked for about 8-10 years, about 1 pack per week.  Vaping Use   Vaping status: Never Used  Substance and Sexual Activity   Alcohol use: No    Alcohol/week: 0.0 standard drinks of alcohol   Drug use: No   Sexual activity: Not on file  Other Topics Concern   Not on file  Social History Narrative   Regular exercise---yes, rabbit hunting, walking 2 times  A week.      Diet---eating fruit and veggies, limiting meat.      Widowed: Remarried.         Social Drivers of Health   Tobacco Use: Medium Risk (06/17/2024)   Patient History    Smoking Tobacco Use: Former    Smokeless Tobacco Use: Never    Passive Exposure: Not on file  Financial Resource Strain: Low Risk (03/04/2024)   Overall Financial Resource Strain (CARDIA)    Difficulty of Paying Living Expenses: Not hard at all  Food Insecurity: No Food Insecurity (05/11/2024)   Epic    Worried About Programme Researcher, Broadcasting/film/video in the Last Year: Never true    Ran Out of Food in the Last Year: Never true  Transportation Needs: No Transportation Needs (05/11/2024)   Epic    Lack of Transportation (Medical): No    Lack of Transportation (Non-Medical): No  Physical Activity: Sufficiently Active (03/04/2024)   Exercise Vital Sign    Days of Exercise per Week: 5 days    Minutes of Exercise per Session: 30 min  Stress: No Stress Concern Present (03/04/2024)   [employer] of Occupational Health - Occupational Stress Questionnaire    Feeling of Stress: Not at all  Social Connections: Moderately Integrated (03/04/2024)   Social Connection and Isolation Panel    Frequency of Communication with Friends and Family: More than three times a week    Frequency of Social Gatherings with Friends and Family: More than three times a week    Attends Religious Services: More than 4 times per year    Active Member of Clubs or Organizations: No    Attends Tax Inspector Meetings: Never    Marital Status: Married  Catering Manager Violence: Not At Risk (05/11/2024)   Epic    Fear of Current or Ex-Partner: No    Emotionally Abused: No    Physically Abused: No    Sexually Abused: No  Depression (PHQ2-9): Low Risk (07/02/2024)   Depression (PHQ2-9)    PHQ-2 Score: 0  Alcohol Screen: Low Risk (03/04/2024)   Alcohol Screen    Last Alcohol Screening Score (AUDIT): 0  Housing: Low Risk (05/11/2024)   Epic    Unable to Pay for Housing in the Last Year: No    Number of Times Moved in the Last Year: 0    Homeless in the Last Year: No  Utilities: Not At Risk (05/11/2024)   Epic    Threatened with loss of utilities: No  Health Literacy: Adequate Health Literacy (03/04/2024)  B1300 Health Literacy    Frequency of need for help with medical instructions: Never    FAMILY HISTORY: Family History  Problem Relation Age of Onset   Arthritis Mother    Arrhythmia Mother    Stroke Paternal Grandmother     ALLERGIES:  has no known allergies.  MEDICATIONS:  Current Outpatient Medications  Medication Sig Dispense Refill   allopurinol  (ZYLOPRIM ) 300 MG tablet Take 1 tablet (300 mg total) by mouth daily. 90 tablet 1   aspirin  EC 81 MG tablet Take 81 mg by mouth daily. Swallow whole.     atorvastatin  (LIPITOR) 10 MG tablet Take 1 tablet (10 mg total) by mouth daily. 90 tablet 3   cholecalciferol (VITAMIN D3) 25 MCG (1000 UNIT) tablet Take 1,000 Units by mouth daily.     fluticasone -salmeterol (ADVAIR DISKUS) 500-50 MCG/ACT AEPB Inhale 1 puff into the lungs in the morning and at bedtime. (Patient not taking: Reported on 05/11/2024) 60 each 6   guaiFENesin -codeine  100-10 MG/5ML syrup Take 10 mLs by mouth every 6 (six) hours as needed for cough. 237 mL 0   lidocaine -prilocaine  (EMLA ) cream Apply 1 Application topically as needed. 30 g 0   losartan  (COZAAR ) 50 MG tablet Take 1 tablet (50 mg total) by mouth daily. 90 tablet 3   naproxen sodium (ALEVE) 220  MG tablet Take 220 mg by mouth daily as needed (for pain).     ondansetron  (ZOFRAN ) 8 MG tablet Take 1 tablet (8 mg total) by mouth every 8 (eight) hours as needed. 30 tablet 0   predniSONE  (DELTASONE ) 20 MG tablet Take 2 tablets (40 mg total) by mouth daily with breakfast. Take for 4 days. 8 tablet 0   prochlorperazine  (COMPAZINE ) 10 MG tablet Take 1 tablet (10 mg total) by mouth every 6 (six) hours as needed for nausea or vomiting. 30 tablet 0   zinc sulfate, 50mg  elemental zinc, 220 (50 Zn) MG capsule Take 220 mg by mouth daily.     No current facility-administered medications for this visit.   Facility-Administered Medications Ordered in Other Visits  Medication Dose Route Frequency Provider Last Rate Last Admin   0.9 %  sodium chloride  infusion   Intravenous Continuous Erikka Follmer T IV, MD   Stopped at 07/02/24 1455   DOXOrubicin  (ADRIAMYCIN ) chemo injection 52 mg  25 mg/m2 (Treatment Plan Recorded) Intravenous Once Adeline Petitfrere T IV, MD   Stopped at 06/03/24 1605    REVIEW OF SYSTEMS:   Constitutional: ( - ) fevers, ( - )  chills , ( - ) night sweats Eyes: ( - ) blurriness of vision, ( - ) double vision, ( - ) watery eyes Ears, nose, mouth, throat, and face: ( - ) mucositis, ( - ) sore throat Respiratory: ( - ) cough, ( - ) dyspnea, ( - ) wheezes Cardiovascular: ( - ) palpitation, ( - ) chest discomfort, ( - ) lower extremity swelling Gastrointestinal:  ( - ) nausea, ( - ) heartburn, ( - ) change in bowel habits Skin: ( - ) abnormal skin rashes Lymphatics: ( - ) new lymphadenopathy, ( - ) easy bruising Neurological: ( - ) numbness, ( - ) tingling, ( - ) new weaknesses Behavioral/Psych: ( - ) mood change, ( - ) new changes  All other systems were reviewed with the patient and are negative.  PHYSICAL EXAMINATION:  Vitals:   07/02/24 0952 07/02/24 0953  BP: (!) 147/81 (!) 143/79  Pulse: 84   Resp: 19   Temp:  97.9 F (36.6 C)   SpO2: 100%      Filed Weights   07/02/24 0952   Weight: 206 lb 11.2 oz (93.8 kg)      GENERAL: well appearing elderly African-American male in NAD  SKIN: skin color, texture, turgor are normal, no rashes or significant lesions EYES: conjunctiva are pink and non-injected, sclera clear LUNGS: clear to auscultation and percussion with normal breathing effort HEART: regular rate & rhythm and no murmurs and no lower extremity edema Musculoskeletal: no cyanosis of digits and no clubbing  PSYCH: alert & oriented x 3, fluent speech NEURO: no focal motor/sensory deficits  LABORATORY DATA:  I have reviewed the data as listed    Latest Ref Rng & Units 07/02/2024    8:54 AM 06/17/2024    1:56 PM 06/16/2024    9:53 AM  CBC  WBC 4.0 - 10.5 K/uL 2.8  3.4  3.8   Hemoglobin 13.0 - 17.0 g/dL 88.5  88.7  88.7   Hematocrit 39.0 - 52.0 % 34.9  36.8  35.2   Platelets 150 - 400 K/uL 254  257  241        Latest Ref Rng & Units 07/02/2024    8:54 AM 06/17/2024    1:56 PM 06/16/2024    9:53 AM  CMP  Glucose 70 - 99 mg/dL 868  81  893   BUN 8 - 23 mg/dL 13  10  11    Creatinine 0.61 - 1.24 mg/dL 8.68  9.09  8.96   Sodium 135 - 145 mmol/L 142  140  144   Potassium 3.5 - 5.1 mmol/L 3.6  4.1  3.9   Chloride 98 - 111 mmol/L 108  105  108   CO2 22 - 32 mmol/L 25  26  28    Calcium  8.9 - 10.3 mg/dL 8.8  9.0  9.2   Total Protein 6.5 - 8.1 g/dL 7.1   7.4   Total Bilirubin 0.0 - 1.2 mg/dL <9.7   0.3   Alkaline Phos 38 - 126 U/L 77   88   AST 15 - 41 U/L 27   24   ALT 0 - 44 U/L 14   22      ASSESSMENT & PLAN Chase Moreno 73 y.o. male with medical history significant for sleep apnea with CPAP machine, type 2 diabetes, hypertension, prostate cancer, and allergic rhinitis who presents for evaluation of newly diagnosed Hodgkin's lymphoma.    After review of the labs, review of the records, and discussion with the patient the patients findings are most consistent with a new diagnosis of Hodgkin's lymphoma stage IV.  Today we discussed the diagnosis,  prognosis, and treatment moving forward.  He voices understanding of our findings and plan moving forward.  # Hodgkin's Lymphoma, Stage IV  -- Diagnosis confirmed with lymph node biopsy via bronchoscopy as well as PET CT scan. -- Treatment of choice for stage IV Hodgkin's lymphoma would be Nivo-AVD chemotherapy. PLAN: -- Today is cycle 2 day 1 of Nivo AVD.  Will be delayed until 06/18/2024 due to port issues -- Labs today show white blood cell 2.8, Hgb 11.4, MCV 82.7, Plt 254. Cr and LFTs WNL.  --plan for repeat PET CT scan after 6 cycles of treatment.  -- Return to clinic for next treatment in 2 weeks time.  #Supportive Care -- chemotherapy education completed -- port placed and then replaced on 06/18/2024  --Echocardiogram adequate for treatment.  -- zofran  8mg  q8H PRN and compazine   10mg  PO q6H for nausea -- allopurinol  300mg  PO daily for TLS prophylaxis -- EMLA  cream for port -- no pain medication required at this time.    Orders Placed This Encounter  Procedures   CBC with Differential (Cancer Center Only)    Standing Status:   Future    Number of Occurrences:   1    Expected Date:   07/02/2024    Expiration Date:   07/02/2025   CMP (Cancer Center only)    Standing Status:   Future    Number of Occurrences:   1    Expected Date:   07/02/2024    Expiration Date:   07/02/2025   CBC with Differential (Cancer Center Only)    Standing Status:   Future    Expected Date:   07/16/2024    Expiration Date:   07/16/2025   CMP (Cancer Center only)    Standing Status:   Future    Expected Date:   07/16/2024    Expiration Date:   07/16/2025   CBC with Differential (Cancer Center Only)    Standing Status:   Future    Expected Date:   07/30/2024    Expiration Date:   07/30/2025   CMP (Cancer Center only)    Standing Status:   Future    Expected Date:   07/30/2024    Expiration Date:   07/30/2025   T4    Standing Status:   Future    Expected Date:   07/30/2024    Expiration Date:   07/30/2025    TSH    Standing Status:   Future    Expected Date:   07/30/2024    Expiration Date:   07/30/2025   CBC with Differential (Cancer Center Only)    Standing Status:   Future    Expected Date:   08/13/2024    Expiration Date:   08/13/2025   CMP (Cancer Center only)    Standing Status:   Future    Expected Date:   08/13/2024    Expiration Date:   08/13/2025   CBC with Differential (Cancer Center Only)    Standing Status:   Future    Expected Date:   08/27/2024    Expiration Date:   08/27/2025   CMP (Cancer Center only)    Standing Status:   Future    Expected Date:   08/27/2024    Expiration Date:   08/27/2025   CBC with Differential (Cancer Center Only)    Standing Status:   Future    Expected Date:   09/10/2024    Expiration Date:   09/10/2025   CMP (Cancer Center only)    Standing Status:   Future    Expected Date:   09/10/2024    Expiration Date:   09/10/2025    All questions were answered. The patient knows to call the clinic with any problems, questions or concerns.  A total of more than 30 minutes were spent on this encounter with face-to-face time and non-face-to-face time, including preparing to see the patient, ordering tests and/or medications, counseling the patient and coordination of care as outlined above.   Norleen IVAR Kidney, MD Department of Hematology/Oncology [facility] at [facility] Phone: [phone] Pager: [phone] Email: [email or web address]  07/02/2024 4:20 PM  "

## 2024-07-15 NOTE — Progress Notes (Unsigned)
 " [facility] [phone]   [phone]  PROGRESS NOTE   Patient Care Team: Avelina Greig BRAVO, MD as PCP - General Renda Glance, MD as Consulting Physician (Urology) Nieves Cough, MD as Consulting Physician (Urology) Parrett, Madelin RAMAN, NP as Nurse Practitioner (Pulmonary Disease) Elana Montie CROME, RN as Oncology Nurse Navigator Elana Montie CROME, RN as Oncology Nurse Navigator  Hematological/Oncological History # Hodgkin's Lymphoma, Stage IV  04/14/2024: establish care with [facility] 04/22/2024: PET CT scan showed hypermetabolic lymphadenopathy, pulmonary nodules and bone lesions consistent with metastatic disease 04/29/2024: bronchoscopy with lymph node biopsy showed cells morphologically and immunophenotypically consistent with classical  Hodgkin's lymphoma.  05/11/2024: establish care with Dr. Federico  06/03/2024: Cycle 1 Day 1 of Nivo-AVD chemotherapy 06/04/2024: Nivo and Adriamycin  given on Day 2 due to port issues on Day 1 07/02/2024: Cycle 2 Day 1 of Nivo-AVD chemotherapy  HISTORY OF PRESENTING ILLNESS:  Chase Moreno 74 y.o. male with medical history significant for Hodgkin's lymphoma who presents today for follow-up visit and treatment.  On exam today Chase Moreno reports***  MEDICAL HISTORY:  Past Medical History:  Diagnosis Date   Allergic rhinitis, cause unspecified    Cancer (HCC)    prostate cancer 2018   Diabetes mellitus without complication [facility])    patient denies although dx in 2017 with a Dr Avelina ; last hgA1c was however 5.5    Unspecified essential hypertension    Unspecified sleep apnea    CPAP machine use     SURGICAL HISTORY: Past Surgical History:  Procedure Laterality Date   arm surgery Left    ENDOBRONCHIAL ULTRASOUND N/A 04/29/2024   Procedure: ENDOBRONCHIAL ULTRASOUND (EBUS);  Surgeon: Kara Dorn NOVAK, MD;  Location: [facility] ENDOSCOPY;  Service: Pulmonary;  Laterality: N/A;   IR CHEST FLUORO  06/16/2024   IR  IMAGING GUIDED PORT INSERTION  05/14/2024   IR IMAGING GUIDED PORT INSERTION  06/17/2024   IR RADIOLOGIST EVAL & MGMT  06/16/2024   IR REMOVAL TUN ACCESS W/ PORT W/O FL MOD SED  06/17/2024   LYMPHADENECTOMY Bilateral 02/17/2017   Procedure: LYMPHADENECTOMY;  Surgeon: Renda Glance, MD;  Location: [facility];  Service: Urology;  Laterality: Bilateral;   ROBOT ASSISTED LAPAROSCOPIC RADICAL PROSTATECTOMY N/A 02/17/2017   Procedure: XI ROBOTIC ASSISTED LAPAROSCOPIC RADICAL PROSTATECTOMY LEVEL 2;  Surgeon: Renda Glance, MD;  Location: [facility];  Service: Urology;  Laterality: N/A;   TONSILLECTOMY     VIDEO BRONCHOSCOPY WITH ENDOBRONCHIAL NAVIGATION N/A 04/29/2024   Procedure: VIDEO BRONCHOSCOPY WITH ENDOBRONCHIAL NAVIGATION;  Surgeon: Kara Dorn NOVAK, MD;  Location: [facility] ENDOSCOPY;  Service: Pulmonary;  Laterality: N/A;    SOCIAL HISTORY: Social History   Socioeconomic History   Marital status: Married    Spouse name: Not on file   Number of children: 2   Years of education: Not on file   Highest education level: Not on file  Occupational History   Occupation: machine shop    Comment: [employer]  Tobacco Use   Smoking status: Former   Smokeless tobacco: Never   Tobacco comments:    quit over 40 years ago    Smoked for about 8-10 years, about 1 pack per week.  Vaping Use   Vaping status: Never Used  Substance and Sexual Activity   Alcohol use: No    Alcohol/week: 0.0 standard drinks of alcohol   Drug use: No   Sexual activity: Not on file  Other Topics Concern   Not on file  Social History  Narrative   Regular exercise---yes, rabbit hunting, walking 2 times  A week.      Diet---eating fruit and veggies, limiting meat.      Widowed: Remarried.         Social Drivers of Health   Tobacco Use: Medium Risk (06/17/2024)   Patient History    Smoking Tobacco Use: Former    Smokeless Tobacco Use: Never    Passive Exposure: Not on file  Financial Resource Strain: Low Risk  (03/04/2024)   Overall Financial Resource Strain (CARDIA)    Difficulty of Paying Living Expenses: Not hard at all  Food Insecurity: No Food Insecurity (05/11/2024)   Epic    Worried About Programme Researcher, Broadcasting/film/video in the Last Year: Never true    Ran Out of Food in the Last Year: Never true  Transportation Needs: No Transportation Needs (05/11/2024)   Epic    Lack of Transportation (Medical): No    Lack of Transportation (Non-Medical): No  Physical Activity: Sufficiently Active (03/04/2024)   Exercise Vital Sign    Days of Exercise per Week: 5 days    Minutes of Exercise per Session: 30 min  Stress: No Stress Concern Present (03/04/2024)   [employer] of Occupational Health - Occupational Stress Questionnaire    Feeling of Stress: Not at all  Social Connections: Moderately Integrated (03/04/2024)   Social Connection and Isolation Panel    Frequency of Communication with Friends and Family: More than three times a week    Frequency of Social Gatherings with Friends and Family: More than three times a week    Attends Religious Services: More than 4 times per year    Active Member of Clubs or Organizations: No    Attends Banker Meetings: Never    Marital Status: Married  Catering Manager Violence: Not At Risk (05/11/2024)   Epic    Fear of Current or Ex-Partner: No    Emotionally Abused: No    Physically Abused: No    Sexually Abused: No  Depression (PHQ2-9): Low Risk (07/02/2024)   Depression (PHQ2-9)    PHQ-2 Score: 0  Alcohol Screen: Low Risk (03/04/2024)   Alcohol Screen    Last Alcohol Screening Score (AUDIT): 0  Housing: Low Risk (05/11/2024)   Epic    Unable to Pay for Housing in the Last Year: No    Number of Times Moved in the Last Year: 0    Homeless in the Last Year: No  Utilities: Not At Risk (05/11/2024)   Epic    Threatened with loss of utilities: No  Health Literacy: Adequate Health Literacy (03/04/2024)   B1300 Health Literacy    Frequency of need  for help with medical instructions: Never    FAMILY HISTORY: Family History  Problem Relation Age of Onset   Arthritis Mother    Arrhythmia Mother    Stroke Paternal Grandmother     ALLERGIES:  has no known allergies.  MEDICATIONS:  Current Outpatient Medications  Medication Sig Dispense Refill   allopurinol  (ZYLOPRIM ) 300 MG tablet Take 1 tablet (300 mg total) by mouth daily. 90 tablet 1   aspirin  EC 81 MG tablet Take 81 mg by mouth daily. Swallow whole.     atorvastatin  (LIPITOR) 10 MG tablet Take 1 tablet (10 mg total) by mouth daily. 90 tablet 3   cholecalciferol (VITAMIN D3) 25 MCG (1000 UNIT) tablet Take 1,000 Units by mouth daily.     fluticasone -salmeterol (ADVAIR DISKUS) 500-50 MCG/ACT AEPB Inhale 1 puff into  the lungs in the morning and at bedtime. (Patient not taking: Reported on 05/11/2024) 60 each 6   guaiFENesin -codeine  100-10 MG/5ML syrup Take 10 mLs by mouth every 6 (six) hours as needed for cough. 237 mL 0   lidocaine -prilocaine  (EMLA ) cream Apply 1 Application topically as needed. 30 g 0   losartan  (COZAAR ) 50 MG tablet Take 1 tablet (50 mg total) by mouth daily. 90 tablet 3   naproxen sodium (ALEVE) 220 MG tablet Take 220 mg by mouth daily as needed (for pain).     ondansetron  (ZOFRAN ) 8 MG tablet Take 1 tablet (8 mg total) by mouth every 8 (eight) hours as needed. 30 tablet 0   predniSONE  (DELTASONE ) 20 MG tablet Take 2 tablets (40 mg total) by mouth daily with breakfast. Take for 4 days. 8 tablet 0   prochlorperazine  (COMPAZINE ) 10 MG tablet Take 1 tablet (10 mg total) by mouth every 6 (six) hours as needed for nausea or vomiting. 30 tablet 0   zinc sulfate, 50mg  elemental zinc, 220 (50 Zn) MG capsule Take 220 mg by mouth daily.     No current facility-administered medications for this visit.   Facility-Administered Medications Ordered in Other Visits  Medication Dose Route Frequency Provider Last Rate Last Admin   DOXOrubicin  (ADRIAMYCIN ) chemo injection 52 mg   25 mg/m2 (Treatment Plan Recorded) Intravenous Once Federico Norleen ONEIDA MADISON, MD   Stopped at 06/03/24 1605    REVIEW OF SYSTEMS:   Constitutional: ( - ) fevers, ( - )  chills , ( - ) night sweats Eyes: ( - ) blurriness of vision, ( - ) double vision, ( - ) watery eyes Ears, nose, mouth, throat, and face: ( - ) mucositis, ( - ) sore throat Respiratory: ( - ) cough, ( - ) dyspnea, ( - ) wheezes Cardiovascular: ( - ) palpitation, ( - ) chest discomfort, ( - ) lower extremity swelling Gastrointestinal:  ( - ) nausea, ( - ) heartburn, ( - ) change in bowel habits Skin: ( - ) abnormal skin rashes Lymphatics: ( - ) new lymphadenopathy, ( - ) easy bruising Neurological: ( - ) numbness, ( - ) tingling, ( - ) new weaknesses Behavioral/Psych: ( - ) mood change, ( - ) new changes  All other systems were reviewed with the patient and are negative.  PHYSICAL EXAMINATION:  There were no vitals filed for this visit.    There were no vitals filed for this visit.     GENERAL: well appearing elderly African-American male in NAD  SKIN: skin color, texture, turgor are normal, no rashes or significant lesions EYES: conjunctiva are pink and non-injected, sclera clear LUNGS: clear to auscultation and percussion with normal breathing effort HEART: regular rate & rhythm and no murmurs and no lower extremity edema Musculoskeletal: no cyanosis of digits and no clubbing  PSYCH: alert & oriented x 3, fluent speech NEURO: no focal motor/sensory deficits  LABORATORY DATA:  I have reviewed the data as listed    Latest Ref Rng & Units 07/02/2024    8:54 AM 06/17/2024    1:56 PM 06/16/2024    9:53 AM  CBC  WBC 4.0 - 10.5 K/uL 2.8  3.4  3.8   Hemoglobin 13.0 - 17.0 g/dL 88.5  88.7  88.7   Hematocrit 39.0 - 52.0 % 34.9  36.8  35.2   Platelets 150 - 400 K/uL 254  257  241        Latest Ref Rng & Units  07/02/2024    8:54 AM 06/17/2024    1:56 PM 06/16/2024    9:53 AM  CMP  Glucose 70 - 99 mg/dL 868  81  893    BUN 8 - 23 mg/dL 13  10  11    Creatinine 0.61 - 1.24 mg/dL 8.68  9.09  8.96   Sodium 135 - 145 mmol/L 142  140  144   Potassium 3.5 - 5.1 mmol/L 3.6  4.1  3.9   Chloride 98 - 111 mmol/L 108  105  108   CO2 22 - 32 mmol/L 25  26  28    Calcium  8.9 - 10.3 mg/dL 8.8  9.0  9.2   Total Protein 6.5 - 8.1 g/dL 7.1   7.4   Total Bilirubin 0.0 - 1.2 mg/dL <9.7   0.3   Alkaline Phos 38 - 126 U/L 77   88   AST 15 - 41 U/L 27   24   ALT 0 - 44 U/L 14   22      ASSESSMENT & PLAN Chase Moreno 73 y.o. male with medical history significant for sleep apnea with CPAP machine, type 2 diabetes, hypertension, prostate cancer, and allergic rhinitis who presents for evaluation of newly diagnosed Hodgkin's lymphoma.    After review of the labs, review of the records, and discussion with the patient the patients findings are most consistent with a new diagnosis of Hodgkin's lymphoma stage IV.  Today we discussed the diagnosis, prognosis, and treatment moving forward.  He voices understanding of our findings and plan moving forward.  # Hodgkin's Lymphoma, Stage IV  -- Diagnosis confirmed with lymph node biopsy via bronchoscopy as well as PET CT scan. -- Treatment of choice for stage IV Hodgkin's lymphoma would be Nivo-AVD chemotherapy. --plan for repeat PET CT scan after 6 cycles of treatment.  PLAN: -- Today is cycle 2 day 15 of Nivo AVD.   -- Labs today show *** --Proceed with treatment without any dose modifications. --RTC in 2 weeks with labs and follow up prior to Cycle 3, Day 1  #Supportive Care -- chemotherapy education completed -- port placed and then replaced on 06/18/2024  --Echocardiogram adequate for treatment.  -- zofran  8mg  q8H PRN and compazine  10mg  PO q6H for nausea -- allopurinol  300mg  PO daily for TLS prophylaxis -- EMLA  cream for port -- no pain medication required at this time.    No orders of the defined types were placed in this encounter.   All questions were answered. The  patient knows to call the clinic with any problems, questions or concerns.   I have spent a total of 30 minutes minutes of face-to-face and non-face-to-face time, preparing to see the patient,  performing a medically appropriate examination, counseling and educating the patient, documenting clinical information in the electronic health record, independently interpreting results and communicating results to the patient, and care coordination.   Johnston Police PA-C Dept of Hematology and Oncology [facility] Phone: [phone]   07/15/2024 9:31 PM  "

## 2024-07-16 ENCOUNTER — Inpatient Hospital Stay: Payer: Medicare (Managed Care)

## 2024-07-16 ENCOUNTER — Inpatient Hospital Stay: Payer: Medicare (Managed Care) | Attending: Physician Assistant | Admitting: Physician Assistant

## 2024-07-16 VITALS — BP 137/83 | HR 78 | Temp 97.8°F | Resp 17 | Ht 67.0 in | Wt 209.0 lb

## 2024-07-16 DIAGNOSIS — Z79899 Other long term (current) drug therapy: Secondary | ICD-10-CM | POA: Diagnosis not present

## 2024-07-16 DIAGNOSIS — Z5111 Encounter for antineoplastic chemotherapy: Secondary | ICD-10-CM | POA: Insufficient documentation

## 2024-07-16 DIAGNOSIS — C8108 Nodular lymphocyte predominant Hodgkin lymphoma, lymph nodes of multiple sites: Secondary | ICD-10-CM

## 2024-07-16 DIAGNOSIS — C8198 Hodgkin lymphoma, unspecified, lymph nodes of multiple sites: Secondary | ICD-10-CM | POA: Diagnosis present

## 2024-07-16 DIAGNOSIS — L299 Pruritus, unspecified: Secondary | ICD-10-CM | POA: Diagnosis not present

## 2024-07-16 DIAGNOSIS — Z5112 Encounter for antineoplastic immunotherapy: Secondary | ICD-10-CM | POA: Diagnosis not present

## 2024-07-16 DIAGNOSIS — Z95828 Presence of other vascular implants and grafts: Secondary | ICD-10-CM | POA: Diagnosis not present

## 2024-07-16 DIAGNOSIS — F17210 Nicotine dependence, cigarettes, uncomplicated: Secondary | ICD-10-CM | POA: Insufficient documentation

## 2024-07-16 LAB — CBC WITH DIFFERENTIAL (CANCER CENTER ONLY)
Abs Immature Granulocytes: 0.03 K/uL (ref 0.00–0.07)
Basophils Absolute: 0.1 K/uL (ref 0.0–0.1)
Basophils Relative: 2 %
Eosinophils Absolute: 0.3 K/uL (ref 0.0–0.5)
Eosinophils Relative: 8 %
HCT: 35.2 % — ABNORMAL LOW (ref 39.0–52.0)
Hemoglobin: 11.6 g/dL — ABNORMAL LOW (ref 13.0–17.0)
Immature Granulocytes: 1 %
Lymphocytes Relative: 34 %
Lymphs Abs: 1.4 K/uL (ref 0.7–4.0)
MCH: 27.4 pg (ref 26.0–34.0)
MCHC: 33 g/dL (ref 30.0–36.0)
MCV: 83.2 fL (ref 80.0–100.0)
Monocytes Absolute: 0.8 K/uL (ref 0.1–1.0)
Monocytes Relative: 20 %
Neutro Abs: 1.4 K/uL — ABNORMAL LOW (ref 1.7–7.7)
Neutrophils Relative %: 35 %
Platelet Count: 217 K/uL (ref 150–400)
RBC: 4.23 MIL/uL (ref 4.22–5.81)
RDW: 18.4 % — ABNORMAL HIGH (ref 11.5–15.5)
WBC Count: 3.9 K/uL — ABNORMAL LOW (ref 4.0–10.5)
nRBC: 0 % (ref 0.0–0.2)

## 2024-07-16 LAB — CMP (CANCER CENTER ONLY)
ALT: 14 U/L (ref 0–44)
AST: 25 U/L (ref 15–41)
Albumin: 4.1 g/dL (ref 3.5–5.0)
Alkaline Phosphatase: 72 U/L (ref 38–126)
Anion gap: 10 (ref 5–15)
BUN: 13 mg/dL (ref 8–23)
CO2: 24 mmol/L (ref 22–32)
Calcium: 8.8 mg/dL — ABNORMAL LOW (ref 8.9–10.3)
Chloride: 110 mmol/L (ref 98–111)
Creatinine: 1.08 mg/dL (ref 0.61–1.24)
GFR, Estimated: >60 mL/min
Glucose, Bld: 118 mg/dL — ABNORMAL HIGH (ref 70–99)
Potassium: 3.7 mmol/L (ref 3.5–5.1)
Sodium: 144 mmol/L (ref 135–145)
Total Bilirubin: 0.2 mg/dL (ref 0.0–1.2)
Total Protein: 7 g/dL (ref 6.5–8.1)

## 2024-07-16 MED ORDER — SODIUM CHLORIDE 0.9 % IV SOLN: INTRAVENOUS | Status: DC

## 2024-07-16 MED ORDER — VINBLASTINE SULFATE CHEMO INJECTION 1 MG/ML
6.0000 mg/m2 | Freq: Once | INTRAVENOUS | Status: AC
  Administered 2024-07-16: 12.2 mg via INTRAVENOUS
  Filled 2024-07-16: qty 12.2

## 2024-07-16 MED ORDER — HYDROXYZINE HCL 10 MG PO TABS: 10.0000 mg | ORAL_TABLET | Freq: Three times a day (TID) | ORAL | 0 refills | Status: AC | PRN

## 2024-07-16 MED ORDER — SODIUM CHLORIDE 0.9 % IV SOLN
375.0000 mg/m2 | Freq: Once | INTRAVENOUS | Status: AC
  Administered 2024-07-16: 800 mg via INTRAVENOUS
  Filled 2024-07-16: qty 80

## 2024-07-16 MED ORDER — DOXORUBICIN HCL CHEMO IV INJECTION 2 MG/ML
25.0000 mg/m2 | Freq: Once | INTRAVENOUS | Status: AC
  Administered 2024-07-16: 52 mg via INTRAVENOUS
  Filled 2024-07-16: qty 26

## 2024-07-16 MED ORDER — DEXAMETHASONE SOD PHOSPHATE PF 10 MG/ML IJ SOLN
10.0000 mg | Freq: Once | INTRAMUSCULAR | Status: AC
  Administered 2024-07-16: 10 mg via INTRAVENOUS

## 2024-07-16 MED ORDER — SODIUM CHLORIDE 0.9% FLUSH: 10.0000 mL | INTRAVENOUS | Status: DC | PRN

## 2024-07-16 MED ORDER — SODIUM CHLORIDE 0.9 % IV SOLN
240.0000 mg | Freq: Once | INTRAVENOUS | Status: AC
  Administered 2024-07-16: 240 mg via INTRAVENOUS
  Filled 2024-07-16: qty 24

## 2024-07-16 MED ORDER — SODIUM CHLORIDE 0.9 % IV SOLN
150.0000 mg | Freq: Once | INTRAVENOUS | Status: AC
  Administered 2024-07-16: 150 mg via INTRAVENOUS
  Filled 2024-07-16: qty 150

## 2024-07-16 MED ORDER — PALONOSETRON HCL INJECTION 0.25 MG/5ML
0.2500 mg | Freq: Once | INTRAVENOUS | Status: AC
  Administered 2024-07-16: 0.25 mg via INTRAVENOUS
  Filled 2024-07-16: qty 5

## 2024-07-16 NOTE — Patient Instructions (Signed)
 [facility] - A DEPT OF [facility]  Discharge Instructions: Thank you for choosing [facility] to provide your oncology and hematology care.   If you have a lab appointment with the Cancer Center, please go directly to the Cancer Center and check in at the registration area.   Wear comfortable clothing and clothing appropriate for easy access to any Portacath or PICC line.   We strive to give you quality time with your provider. You may need to reschedule your appointment if you arrive late (15 or more minutes).  Arriving late affects you and other patients whose appointments are after yours.  Also, if you miss three or more appointments without notifying the office, you may be dismissed from the clinic at the providers discretion.      For prescription refill requests, have your pharmacy contact our office and allow 72 hours for refills to be completed.    Today you received the following chemotherapy and/or immunotherapy agents doxorubicin  velban  DTIC and opdivo       To help prevent nausea and vomiting after your treatment, we encourage you to take your nausea medication as directed.  BELOW ARE SYMPTOMS THAT SHOULD BE REPORTED IMMEDIATELY: *FEVER GREATER THAN 100.4 F (38 C) OR HIGHER *CHILLS OR SWEATING *NAUSEA AND VOMITING THAT IS NOT CONTROLLED WITH YOUR NAUSEA MEDICATION *UNUSUAL SHORTNESS OF BREATH *UNUSUAL BRUISING OR BLEEDING *URINARY PROBLEMS (pain or burning when urinating, or frequent urination) *BOWEL PROBLEMS (unusual diarrhea, constipation, pain near the anus) TENDERNESS IN MOUTH AND THROAT WITH OR WITHOUT PRESENCE OF ULCERS (sore throat, sores in mouth, or a toothache) UNUSUAL RASH, SWELLING OR PAIN  UNUSUAL VAGINAL DISCHARGE OR ITCHING   Items with * indicate a potential emergency and should be followed up as soon as possible or go to the Emergency Department if any problems should occur.  Please show the CHEMOTHERAPY  ALERT CARD or IMMUNOTHERAPY ALERT CARD at check-in to the Emergency Department and triage nurse.  Should you have questions after your visit or need to cancel or reschedule your appointment, please contact [facility] - A DEPT OF [facility]  Dept: [phone]  and follow the prompts.  Office hours are 8:00 a.m. to 4:30 p.m. Monday - Friday. Please note that voicemails left after 4:00 p.m. may not be returned until the following business day.  We are closed weekends and major holidays. You have access to a nurse at all times for urgent questions. Please call the main number to the clinic Dept: [phone] and follow the prompts.   For any non-urgent questions, you may also contact your provider using MyChart. We now offer e-Visits for anyone 79 and older to request care online for non-urgent symptoms. For details visit [email or web address].   Also download the MyChart app! Go to the app store, search MyChart, open the app, select [place], and log in with your MyChart username and password.

## 2024-07-20 ENCOUNTER — Ambulatory Visit (HOSPITAL_COMMUNITY)
Admission: RE | Admit: 2024-07-20 | Discharge: 2024-07-20 | Disposition: A | Discharge status: Home / Self Care | Payer: Medicare (Managed Care) | Source: Ambulatory Visit | Attending: Physician Assistant | Admitting: Physician Assistant

## 2024-07-20 DIAGNOSIS — Z5112 Encounter for antineoplastic immunotherapy: Secondary | ICD-10-CM

## 2024-07-20 DIAGNOSIS — Z5111 Encounter for antineoplastic chemotherapy: Secondary | ICD-10-CM

## 2024-07-20 DIAGNOSIS — L7632 Postprocedural hematoma of skin and subcutaneous tissue following other procedure: Secondary | ICD-10-CM | POA: Diagnosis present

## 2024-07-20 DIAGNOSIS — C8108 Nodular lymphocyte predominant Hodgkin lymphoma, lymph nodes of multiple sites: Secondary | ICD-10-CM

## 2024-07-20 DIAGNOSIS — Z95828 Presence of other vascular implants and grafts: Secondary | ICD-10-CM

## 2024-07-20 DIAGNOSIS — L299 Pruritus, unspecified: Secondary | ICD-10-CM

## 2024-07-22 ENCOUNTER — Encounter: Payer: Self-pay | Admitting: *Deleted

## 2024-07-29 ENCOUNTER — Inpatient Hospital Stay: Payer: Medicare (Managed Care)

## 2024-07-29 ENCOUNTER — Inpatient Hospital Stay: Payer: Medicare (Managed Care) | Admitting: Hematology and Oncology

## 2024-07-29 VITALS — BP 126/71 | HR 80 | Temp 97.9°F | Resp 20

## 2024-07-29 VITALS — BP 144/84 | HR 83 | Temp 98.7°F | Resp 15 | Wt 209.7 lb

## 2024-07-29 DIAGNOSIS — Z5111 Encounter for antineoplastic chemotherapy: Secondary | ICD-10-CM | POA: Diagnosis not present

## 2024-07-29 DIAGNOSIS — R599 Enlarged lymph nodes, unspecified: Secondary | ICD-10-CM

## 2024-07-29 DIAGNOSIS — Z5112 Encounter for antineoplastic immunotherapy: Secondary | ICD-10-CM

## 2024-07-29 DIAGNOSIS — Z95828 Presence of other vascular implants and grafts: Secondary | ICD-10-CM | POA: Diagnosis not present

## 2024-07-29 DIAGNOSIS — C8178 Other classical Hodgkin lymphoma, lymph nodes of multiple sites: Secondary | ICD-10-CM

## 2024-07-29 DIAGNOSIS — C8108 Nodular lymphocyte predominant Hodgkin lymphoma, lymph nodes of multiple sites: Secondary | ICD-10-CM

## 2024-07-29 LAB — CBC WITH DIFFERENTIAL (CANCER CENTER ONLY)
Abs Immature Granulocytes: 0.01 K/uL (ref 0.00–0.07)
Basophils Absolute: 0.1 K/uL (ref 0.0–0.1)
Basophils Relative: 1 %
Eosinophils Absolute: 0.3 K/uL (ref 0.0–0.5)
Eosinophils Relative: 7 %
HCT: 35 % — ABNORMAL LOW (ref 39.0–52.0)
Hemoglobin: 11.7 g/dL — ABNORMAL LOW (ref 13.0–17.0)
Immature Granulocytes: 0 %
Lymphocytes Relative: 36 %
Lymphs Abs: 1.3 K/uL (ref 0.7–4.0)
MCH: 27.7 pg (ref 26.0–34.0)
MCHC: 33.4 g/dL (ref 30.0–36.0)
MCV: 82.7 fL (ref 80.0–100.0)
Monocytes Absolute: 0.7 K/uL (ref 0.1–1.0)
Monocytes Relative: 18 %
Neutro Abs: 1.4 K/uL — ABNORMAL LOW (ref 1.7–7.7)
Neutrophils Relative %: 38 %
Platelet Count: 230 K/uL (ref 150–400)
RBC: 4.23 MIL/uL (ref 4.22–5.81)
RDW: 17.9 % — ABNORMAL HIGH (ref 11.5–15.5)
WBC Count: 3.7 K/uL — ABNORMAL LOW (ref 4.0–10.5)
nRBC: 0 % (ref 0.0–0.2)

## 2024-07-29 LAB — CMP (CANCER CENTER ONLY)
ALT: 11 U/L (ref 0–44)
AST: 25 U/L (ref 15–41)
Albumin: 4.1 g/dL (ref 3.5–5.0)
Alkaline Phosphatase: 71 U/L (ref 38–126)
Anion gap: 10 (ref 5–15)
BUN: 13 mg/dL (ref 8–23)
CO2: 25 mmol/L (ref 22–32)
Calcium: 9.1 mg/dL (ref 8.9–10.3)
Chloride: 107 mmol/L (ref 98–111)
Creatinine: 1.07 mg/dL (ref 0.61–1.24)
GFR, Estimated: >60 mL/min
Glucose, Bld: 113 mg/dL — ABNORMAL HIGH (ref 70–99)
Potassium: 3.7 mmol/L (ref 3.5–5.1)
Sodium: 142 mmol/L (ref 135–145)
Total Bilirubin: 0.2 mg/dL (ref 0.0–1.2)
Total Protein: 7.1 g/dL (ref 6.5–8.1)

## 2024-07-29 LAB — TSH: TSH: 1.08 u[IU]/mL (ref 0.350–4.500)

## 2024-07-29 MED ORDER — SODIUM CHLORIDE 0.9 % IV SOLN
375.0000 mg/m2 | Freq: Once | INTRAVENOUS | Status: AC
  Administered 2024-07-29: 800 mg via INTRAVENOUS
  Filled 2024-07-29: qty 80

## 2024-07-29 MED ORDER — DEXAMETHASONE SOD PHOSPHATE PF 10 MG/ML IJ SOLN
10.0000 mg | Freq: Once | INTRAMUSCULAR | Status: AC
  Administered 2024-07-29: 10 mg via INTRAVENOUS
  Filled 2024-07-29: qty 1

## 2024-07-29 MED ORDER — PALONOSETRON HCL INJECTION 0.25 MG/5ML
0.2500 mg | Freq: Once | INTRAVENOUS | Status: AC
  Administered 2024-07-29: 0.25 mg via INTRAVENOUS
  Filled 2024-07-29: qty 5

## 2024-07-29 MED ORDER — VINBLASTINE SULFATE CHEMO INJECTION 1 MG/ML
6.0000 mg/m2 | Freq: Once | INTRAVENOUS | Status: AC
  Administered 2024-07-29: 12.2 mg via INTRAVENOUS
  Filled 2024-07-29: qty 12.2

## 2024-07-29 MED ORDER — SODIUM CHLORIDE 0.9 % IV SOLN
240.0000 mg | Freq: Once | INTRAVENOUS | Status: AC
  Administered 2024-07-29: 240 mg via INTRAVENOUS
  Filled 2024-07-29: qty 24

## 2024-07-29 MED ORDER — SODIUM CHLORIDE 0.9% FLUSH: 10.0000 mL | INTRAVENOUS | Status: DC | PRN

## 2024-07-29 MED ORDER — SODIUM CHLORIDE 0.9 % IV SOLN: INTRAVENOUS | Status: DC

## 2024-07-29 MED ORDER — DOXORUBICIN HCL CHEMO IV INJECTION 2 MG/ML
25.0000 mg/m2 | Freq: Once | INTRAVENOUS | Status: AC
  Administered 2024-07-29: 52 mg via INTRAVENOUS
  Filled 2024-07-29: qty 26

## 2024-07-29 MED ORDER — SODIUM CHLORIDE 0.9 % IV SOLN
150.0000 mg | Freq: Once | INTRAVENOUS | Status: AC
  Administered 2024-07-29: 150 mg via INTRAVENOUS
  Filled 2024-07-29: qty 150

## 2024-07-29 NOTE — Progress Notes (Signed)
 " [facility] [phone]   [phone]  PROGRESS NOTE   Patient Care Team: Avelina Greig BRAVO, MD as PCP - General Renda Glance, MD as Consulting Physician (Urology) Nieves Cough, MD as Consulting Physician (Urology) Parrett, Madelin RAMAN, NP as Nurse Practitioner (Pulmonary Disease) Elana Montie CROME, RN as Oncology Nurse Navigator Elana Montie CROME, RN as Oncology Nurse Navigator  Hematological/Oncological History # Hodgkin's Lymphoma, Stage IV  04/14/2024: establish care with [facility] 04/22/2024: PET CT scan showed hypermetabolic lymphadenopathy, pulmonary nodules and bone lesions consistent with metastatic disease 04/29/2024: bronchoscopy with lymph node biopsy showed cells morphologically and immunophenotypically consistent with classical  Hodgkin's lymphoma.  05/11/2024: establish care with Dr. Federico  06/03/2024: Cycle 1 Day 1 of Nivo-AVD chemotherapy 06/04/2024: Nivo and Adriamycin  given on Day 2 due to port issues on Day 1 07/02/2024: Cycle 2 Day 1 of Nivo-AVD chemotherapy 07/29/2024: Cycle 3 Day 1 of Nivo-AVD chemotherapy  HISTORY OF PRESENTING ILLNESS:  Chase Moreno 74 y.o. male with medical history significant for Hodgkin's lymphoma who presents today for follow-up visit and treatment.  On exam today Mr. Biela reports he has been well overall since our last visit.  He reports that his last treatment went well and that he does not have any side effect such as nausea, vomiting, or diarrhea.  He denies any fevers, chills, sweats.  He reports his appetite is good and his weight is up.  He reports his energy today is about a 9.5 out of 10.  He is not having any lightheadedness, dizziness, or shortness of breath.  He denies any runny nose, sore throat, cough.  He reports his port is working well and the former port site is not causing him any trouble.  He reports nothing else out of the ordinary and is willing and able to proceed with treatment  today.  Full 10 point ROS is otherwise negative.  MEDICAL HISTORY:  Past Medical History:  Diagnosis Date   Allergic rhinitis, cause unspecified    Cancer (HCC)    prostate cancer 2018   Diabetes mellitus without complication (HCC)    patient denies although dx in 2017 with a Dr Avelina ; last hgA1c was however 5.5    Unspecified essential hypertension    Unspecified sleep apnea    CPAP machine use     SURGICAL HISTORY: Past Surgical History:  Procedure Laterality Date   arm surgery Left    ENDOBRONCHIAL ULTRASOUND N/A 04/29/2024   Procedure: ENDOBRONCHIAL ULTRASOUND (EBUS);  Surgeon: Kara Dorn NOVAK, MD;  Location: [facility] ENDOSCOPY;  Service: Pulmonary;  Laterality: N/A;   IR CHEST FLUORO  06/16/2024   IR IMAGING GUIDED PORT INSERTION  05/14/2024   IR IMAGING GUIDED PORT INSERTION  06/17/2024   IR RADIOLOGIST EVAL & MGMT  06/16/2024   IR REMOVAL TUN ACCESS W/ PORT W/O FL MOD SED  06/17/2024   LYMPHADENECTOMY Bilateral 02/17/2017   Procedure: LYMPHADENECTOMY;  Surgeon: Renda Glance, MD;  Location: [facility];  Service: Urology;  Laterality: Bilateral;   ROBOT ASSISTED LAPAROSCOPIC RADICAL PROSTATECTOMY N/A 02/17/2017   Procedure: XI ROBOTIC ASSISTED LAPAROSCOPIC RADICAL PROSTATECTOMY LEVEL 2;  Surgeon: Renda Glance, MD;  Location: [facility];  Service: Urology;  Laterality: N/A;   TONSILLECTOMY     VIDEO BRONCHOSCOPY WITH ENDOBRONCHIAL NAVIGATION N/A 04/29/2024   Procedure: VIDEO BRONCHOSCOPY WITH ENDOBRONCHIAL NAVIGATION;  Surgeon: Kara Dorn NOVAK, MD;  Location: [facility] ENDOSCOPY;  Service: Pulmonary;  Laterality: N/A;    SOCIAL HISTORY: Social History   Socioeconomic  History   Marital status: Married    Spouse name: Not on file   Number of children: 2   Years of education: Not on file   Highest education level: Not on file  Occupational History   Occupation: machine shop    Comment: [employer]  Tobacco Use   Smoking status: Former   Smokeless tobacco: Never    Tobacco comments:    quit over 40 years ago    Smoked for about 8-10 years, about 1 pack per week.  Vaping Use   Vaping status: Never Used  Substance and Sexual Activity   Alcohol use: No    Alcohol/week: 0.0 standard drinks of alcohol   Drug use: No   Sexual activity: Not on file  Other Topics Concern   Not on file  Social History Narrative   Regular exercise---yes, rabbit hunting, walking 2 times  A week.      Diet---eating fruit and veggies, limiting meat.      Widowed: Remarried.         Social Drivers of Health   Tobacco Use: Medium Risk (06/17/2024)   Patient History    Smoking Tobacco Use: Former    Smokeless Tobacco Use: Never    Passive Exposure: Not on file  Financial Resource Strain: Low Risk (03/04/2024)   Overall Financial Resource Strain (CARDIA)    Difficulty of Paying Living Expenses: Not hard at all  Food Insecurity: No Food Insecurity (05/11/2024)   Epic    Worried About Programme Researcher, Broadcasting/film/video in the Last Year: Never true    Ran Out of Food in the Last Year: Never true  Transportation Needs: No Transportation Needs (05/11/2024)   Epic    Lack of Transportation (Medical): No    Lack of Transportation (Non-Medical): No  Physical Activity: Sufficiently Active (03/04/2024)   Exercise Vital Sign    Days of Exercise per Week: 5 days    Minutes of Exercise per Session: 30 min  Stress: No Stress Concern Present (03/04/2024)   [employer] of Occupational Health - Occupational Stress Questionnaire    Feeling of Stress: Not at all  Social Connections: Moderately Integrated (03/04/2024)   Social Connection and Isolation Panel    Frequency of Communication with Friends and Family: More than three times a week    Frequency of Social Gatherings with Friends and Family: More than three times a week    Attends Religious Services: More than 4 times per year    Active Member of Clubs or Organizations: No    Attends Banker Meetings: Never    Marital  Status: Married  Catering Manager Violence: Not At Risk (05/11/2024)   Epic    Fear of Current or Ex-Partner: No    Emotionally Abused: No    Physically Abused: No    Sexually Abused: No  Depression (PHQ2-9): Low Risk (07/16/2024)   Depression (PHQ2-9)    PHQ-2 Score: 0  Alcohol Screen: Low Risk (03/04/2024)   Alcohol Screen    Last Alcohol Screening Score (AUDIT): 0  Housing: Low Risk (05/11/2024)   Epic    Unable to Pay for Housing in the Last Year: No    Number of Times Moved in the Last Year: 0    Homeless in the Last Year: No  Utilities: Not At Risk (05/11/2024)   Epic    Threatened with loss of utilities: No  Health Literacy: Adequate Health Literacy (03/04/2024)   B1300 Health Literacy    Frequency of need  for help with medical instructions: Never    FAMILY HISTORY: Family History  Problem Relation Age of Onset   Arthritis Mother    Arrhythmia Mother    Stroke Paternal Grandmother     ALLERGIES:  has no known allergies.  MEDICATIONS:  Current Outpatient Medications  Medication Sig Dispense Refill   allopurinol  (ZYLOPRIM ) 300 MG tablet Take 1 tablet (300 mg total) by mouth daily. 90 tablet 1   aspirin  EC 81 MG tablet Take 81 mg by mouth daily. Swallow whole.     atorvastatin  (LIPITOR) 10 MG tablet Take 1 tablet (10 mg total) by mouth daily. 90 tablet 3   cholecalciferol (VITAMIN D3) 25 MCG (1000 UNIT) tablet Take 1,000 Units by mouth daily.     fluticasone -salmeterol (ADVAIR DISKUS) 500-50 MCG/ACT AEPB Inhale 1 puff into the lungs in the morning and at bedtime. (Patient not taking: Reported on 05/11/2024) 60 each 6   guaiFENesin -codeine  100-10 MG/5ML syrup Take 10 mLs by mouth every 6 (six) hours as needed for cough. 237 mL 0   hydrOXYzine  (ATARAX ) 10 MG tablet Take 1 tablet (10 mg total) by mouth 3 (three) times daily as needed. 30 tablet 0   lidocaine -prilocaine  (EMLA ) cream Apply 1 Application topically as needed. 30 g 0   losartan  (COZAAR ) 50 MG tablet Take 1  tablet (50 mg total) by mouth daily. 90 tablet 3   naproxen sodium (ALEVE) 220 MG tablet Take 220 mg by mouth daily as needed (for pain).     ondansetron  (ZOFRAN ) 8 MG tablet Take 1 tablet (8 mg total) by mouth every 8 (eight) hours as needed. 30 tablet 0   predniSONE  (DELTASONE ) 20 MG tablet Take 2 tablets (40 mg total) by mouth daily with breakfast. Take for 4 days. 8 tablet 0   prochlorperazine  (COMPAZINE ) 10 MG tablet Take 1 tablet (10 mg total) by mouth every 6 (six) hours as needed for nausea or vomiting. 30 tablet 0   zinc sulfate, 50mg  elemental zinc, 220 (50 Zn) MG capsule Take 220 mg by mouth daily.     No current facility-administered medications for this visit.   Facility-Administered Medications Ordered in Other Visits  Medication Dose Route Frequency Provider Last Rate Last Admin   DOXOrubicin  (ADRIAMYCIN ) chemo injection 52 mg  25 mg/m2 (Treatment Plan Recorded) Intravenous Once Deeana Atwater T IV, MD   Stopped at 06/03/24 1605    REVIEW OF SYSTEMS:   Constitutional: ( - ) fevers, ( - )  chills , ( - ) night sweats Eyes: ( - ) blurriness of vision, ( - ) double vision, ( - ) watery eyes Ears, nose, mouth, throat, and face: ( - ) mucositis, ( - ) sore throat Respiratory: ( - ) cough, ( - ) dyspnea, ( - ) wheezes Cardiovascular: ( - ) palpitation, ( - ) chest discomfort, ( - ) lower extremity swelling Gastrointestinal:  ( - ) nausea, ( - ) heartburn, ( - ) change in bowel habits Skin: ( - ) abnormal skin rashes Lymphatics: ( - ) new lymphadenopathy, ( - ) easy bruising Neurological: ( - ) numbness, ( - ) tingling, ( - ) new weaknesses Behavioral/Psych: ( - ) mood change, ( - ) new changes  All other systems were reviewed with the patient and are negative.  PHYSICAL EXAMINATION:  There were no vitals filed for this visit. There were no vitals filed for this visit.  GENERAL: well appearing elderly African-American male in NAD  SKIN: skin color, texture, turgor  are normal,  no rashes or significant lesions EYES: conjunctiva are pink and non-injected, sclera clear LUNGS: clear to auscultation and percussion with normal breathing effort HEART: regular rate & rhythm and no murmurs and no lower extremity edema Musculoskeletal: no cyanosis of digits and no clubbing  PSYCH: alert & oriented x 3, fluent speech NEURO: no focal motor/sensory deficits  LABORATORY DATA:  I have reviewed the data as listed    Latest Ref Rng & Units 07/29/2024    8:23 AM 07/16/2024   10:03 AM 07/02/2024    8:54 AM  CBC  WBC 4.0 - 10.5 K/uL 3.7  3.9  2.8   Hemoglobin 13.0 - 17.0 g/dL 88.2  88.3  88.5   Hematocrit 39.0 - 52.0 % 35.0  35.2  34.9   Platelets 150 - 400 K/uL 230  217  254        Latest Ref Rng & Units 07/29/2024    8:23 AM 07/16/2024   10:03 AM 07/02/2024    8:54 AM  CMP  Glucose 70 - 99 mg/dL 886  881  868   BUN 8 - 23 mg/dL 13  13  13    Creatinine 0.61 - 1.24 mg/dL 8.92  8.91  8.68   Sodium 135 - 145 mmol/L 142  144  142   Potassium 3.5 - 5.1 mmol/L 3.7  3.7  3.6   Chloride 98 - 111 mmol/L 107  110  108   CO2 22 - 32 mmol/L 25  24  25    Calcium  8.9 - 10.3 mg/dL 9.1  8.8  8.8   Total Protein 6.5 - 8.1 g/dL 7.1  7.0  7.1   Total Bilirubin 0.0 - 1.2 mg/dL 0.2  0.2  <9.7   Alkaline Phos 38 - 126 U/L 71  72  77   AST 15 - 41 U/L 25  25  27    ALT 0 - 44 U/L 11  14  14       ASSESSMENT & PLAN Aasim Restivo is a 74 y.o. male with medical history significant for sleep apnea with CPAP machine, type 2 diabetes, hypertension, prostate cancer, and allergic rhinitis who presents for evaluation of newly diagnosed Hodgkin's lymphoma.    After review of the labs, review of the records, and discussion with the patient the patients findings are most consistent with a new diagnosis of Hodgkin's lymphoma stage IV.  Today we discussed the diagnosis, prognosis, and treatment moving forward.  He voices understanding of our findings and plan moving forward.  # Hodgkin's Lymphoma, Stage  IV  -- Diagnosis confirmed with lymph node biopsy via bronchoscopy as well as PET CT scan. -- Treatment of choice for stage IV Hodgkin's lymphoma would be Nivo-AVD chemotherapy. --plan for repeat PET CT scan after 6 cycles of treatment.  PLAN: -- Today is cycle 3 day 1 of Nivo AVD.   -- Labs today show WBC 3.7, Hgb 11.7, MCV 82.7, Plt 230   --Proceed with treatment without any dose modifications. --RTC in 2 weeks with labs and follow up prior to Cycle 3, Day 15  #Pruritus: --Flesh colored rash noted on arms and chest area.  --Advised to try topical benadryl  for spot treatment. Will send atarax  to help with itching.   #Supportive Care -- chemotherapy education completed -- port placed and then replaced on 06/18/2024.  --Echocardiogram adequate for treatment.  -- zofran  8mg  q8H PRN and compazine  10mg  PO q6H for nausea -- allopurinol  300mg  PO daily for TLS prophylaxis -- EMLA   cream for port -- no pain medication required at this time.    No orders of the defined types were placed in this encounter.   All questions were answered. The patient knows to call the clinic with any problems, questions or concerns.   I have spent a total of 30 minutes minutes of face-to-face and non-face-to-face time, preparing to see the patient,  performing a medically appropriate examination, counseling and educating the patient, documenting clinical information in the electronic health record, independently interpreting results and communicating results to the patient, and care coordination.   Norleen IVAR Kidney, MD Department of Hematology/Oncology [facility] Phone: [phone] Pager: [phone] Email: [email or web address]    07/29/2024 8:59 AM  "

## 2024-07-29 NOTE — Patient Instructions (Signed)
 [facility] - A DEPT OF [facility]  Discharge Instructions: Thank you for choosing [facility] to provide your oncology and hematology care.   If you have a lab appointment with the Cancer Center, please go directly to the Cancer Center and check in at the registration area.   Wear comfortable clothing and clothing appropriate for easy access to any Portacath or PICC line.   We strive to give you quality time with your provider. You may need to reschedule your appointment if you arrive late (15 or more minutes).  Arriving late affects you and other patients whose appointments are after yours.  Also, if you miss three or more appointments without notifying the office, you may be dismissed from the clinic at the providers discretion.      For prescription refill requests, have your pharmacy contact our office and allow 72 hours for refills to be completed.    Today you received the following chemotherapy and/or immunotherapy agents: Doxorubicin , Vinblastin, Dacarbazine , and Nivolumab  (Opdivo )   To help prevent nausea and vomiting after your treatment, we encourage you to take your nausea medication as directed.  BELOW ARE SYMPTOMS THAT SHOULD BE REPORTED IMMEDIATELY: *FEVER GREATER THAN 100.4 F (38 C) OR HIGHER *CHILLS OR SWEATING *NAUSEA AND VOMITING THAT IS NOT CONTROLLED WITH YOUR NAUSEA MEDICATION *UNUSUAL SHORTNESS OF BREATH *UNUSUAL BRUISING OR BLEEDING *URINARY PROBLEMS (pain or burning when urinating, or frequent urination) *BOWEL PROBLEMS (unusual diarrhea, constipation, pain near the anus) TENDERNESS IN MOUTH AND THROAT WITH OR WITHOUT PRESENCE OF ULCERS (sore throat, sores in mouth, or a toothache) UNUSUAL RASH, SWELLING OR PAIN  UNUSUAL VAGINAL DISCHARGE OR ITCHING   Items with * indicate a potential emergency and should be followed up as soon as possible or go to the Emergency Department if any problems should occur.  Please  show the CHEMOTHERAPY ALERT CARD or IMMUNOTHERAPY ALERT CARD at check-in to the Emergency Department and triage nurse.  Should you have questions after your visit or need to cancel or reschedule your appointment, please contact [facility] - A DEPT OF [facility]  Dept: [phone]  and follow the prompts.  Office hours are 8:00 a.m. to 4:30 p.m. Monday - Friday. Please note that voicemails left after 4:00 p.m. may not be returned until the following business day.  We are closed weekends and major holidays. You have access to a nurse at all times for urgent questions. Please call the main number to the clinic Dept: [phone] and follow the prompts.   For any non-urgent questions, you may also contact your provider using MyChart. We now offer e-Visits for anyone 77 and older to request care online for non-urgent symptoms. For details visit [email or web address].   Also download the MyChart app! Go to the app store, search MyChart, open the app, select [facility], and log in with your MyChart username and password.

## 2024-07-30 ENCOUNTER — Inpatient Hospital Stay: Payer: Medicare (Managed Care)

## 2024-07-30 ENCOUNTER — Inpatient Hospital Stay: Payer: Medicare (Managed Care) | Admitting: Hematology and Oncology

## 2024-07-30 LAB — T4: T4, Total: 5.3 ug/dL (ref 4.5–12.0)

## 2024-07-30 NOTE — Progress Notes (Signed)
 Patient continues with chemotherapy, will continue to monitor for needs.

## 2024-08-02 ENCOUNTER — Telehealth: Payer: Self-pay | Admitting: Hematology and Oncology

## 2024-08-02 NOTE — Telephone Encounter (Signed)
 Spoke about change in pts next appts scheduled

## 2024-08-11 MED FILL — Fosaprepitant Dimeglumine For IV Infusion 150 MG (Base Eq): INTRAVENOUS | Qty: 5 | Status: AC

## 2024-08-11 NOTE — Progress Notes (Unsigned)
 " [facility] [phone]   [phone]  PROGRESS NOTE   Patient Care Team: Avelina Greig BRAVO, MD as PCP - General Renda Glance, MD as Consulting Physician (Urology) Nieves Cough, MD as Consulting Physician (Urology) Parrett, Madelin RAMAN, NP as Nurse Practitioner (Pulmonary Disease) Elana Montie CROME, RN as Oncology Nurse Navigator Elana Montie CROME, RN as Oncology Nurse Navigator  Hematological/Oncological History # Hodgkin's Lymphoma, Stage IV  04/14/2024: establish care with [facility] clinic 04/22/2024: PET CT scan showed hypermetabolic lymphadenopathy, pulmonary nodules and bone lesions consistent with metastatic disease 04/29/2024: bronchoscopy with lymph node biopsy showed cells morphologically and immunophenotypically consistent with classical  Hodgkin's lymphoma.  05/11/2024: establish care with Dr. Federico  06/03/2024: Cycle 1 Day 1 of Nivo-AVD chemotherapy 06/04/2024: Nivo and Adriamycin  given on Day 2 due to port issues on Day 1 07/02/2024: Cycle 2 Day 1 of Nivo-AVD chemotherapy 07/29/2024: Cycle 3 Day 1 of Nivo-AVD chemotherapy  HISTORY OF PRESENTING ILLNESS:  Chase Moreno 74 y.o. male with medical history significant for Hodgkin's lymphoma who presents today for follow-up visit and treatment.  On exam today Mr. Vizzini reports he has been doing well overall since our last visit.  He reports he fared well through the petrous arm and did not lose any heat or power.  He reports that his last treatment went really good with no complications or side effects.  His appetite remains strong and he is sleeping quite well.  He is not having any lightheadedness, dizziness, shortness of breath.  He denies any nausea, vomiting, or diarrhea.  He said no infectious symptoms such as runny nose, sore throat, cough.  He did receive his flu shot this year.  His weight has been stable and is not having any numbness or tingling of his fingers and toes.  Overall he feels  well.  His primary symptom remains itching.  He is taking 1 itching pill per night and reports it does help him sleep.  He takes it before bedtime.  When he takes it he does not have any issues with itching throughout the rest of the day.  Otherwise a full 10 point ROS is otherwise negative.  He is willing and able to proceed with treatment today.  MEDICAL HISTORY:  Past Medical History:  Diagnosis Date   Allergic rhinitis, cause unspecified    Cancer (HCC)    prostate cancer 2018   Diabetes mellitus without complication (HCC)    patient denies although dx in 2017 with a Dr Avelina ; last hgA1c was however 5.5    Unspecified essential hypertension    Unspecified sleep apnea    CPAP machine use     SURGICAL HISTORY: Past Surgical History:  Procedure Laterality Date   arm surgery Left    ENDOBRONCHIAL ULTRASOUND N/A 04/29/2024   Procedure: ENDOBRONCHIAL ULTRASOUND (EBUS);  Surgeon: Kara Dorn NOVAK, MD;  Location: [facility] ENDOSCOPY;  Service: Pulmonary;  Laterality: N/A;   IR CHEST FLUORO  06/16/2024   IR IMAGING GUIDED PORT INSERTION  05/14/2024   IR IMAGING GUIDED PORT INSERTION  06/17/2024   IR RADIOLOGIST EVAL & MGMT  06/16/2024   IR REMOVAL TUN ACCESS W/ PORT W/O FL MOD SED  06/17/2024   LYMPHADENECTOMY Bilateral 02/17/2017   Procedure: LYMPHADENECTOMY;  Surgeon: Renda Glance, MD;  Location: [facility] ORS;  Service: Urology;  Laterality: Bilateral;   ROBOT ASSISTED LAPAROSCOPIC RADICAL PROSTATECTOMY N/A 02/17/2017   Procedure: XI ROBOTIC ASSISTED LAPAROSCOPIC RADICAL PROSTATECTOMY LEVEL 2;  Surgeon: Renda Glance, MD;  Location:  [facility] ORS;  Service: Urology;  Laterality: N/A;   TONSILLECTOMY     VIDEO BRONCHOSCOPY WITH ENDOBRONCHIAL NAVIGATION N/A 04/29/2024   Procedure: VIDEO BRONCHOSCOPY WITH ENDOBRONCHIAL NAVIGATION;  Surgeon: Kara Dorn NOVAK, MD;  Location: [facility] ENDOSCOPY;  Service: Pulmonary;  Laterality: N/A;    SOCIAL HISTORY: Social History   Socioeconomic History   Marital  status: Married    Spouse name: Not on file   Number of children: 2   Years of education: Not on file   Highest education level: Not on file  Occupational History   Occupation: machine shop    Comment: [employer]  Tobacco Use   Smoking status: Former   Smokeless tobacco: Never   Tobacco comments:    quit over 40 years ago    Smoked for about 8-10 years, about 1 pack per week.  Vaping Use   Vaping status: Never Used  Substance and Sexual Activity   Alcohol use: No    Alcohol/week: 0.0 standard drinks of alcohol   Drug use: No   Sexual activity: Not on file  Other Topics Concern   Not on file  Social History Narrative   Regular exercise---yes, rabbit hunting, walking 2 times  A week.      Diet---eating fruit and veggies, limiting meat.      Widowed: Remarried.         Social Drivers of Health   Tobacco Use: Medium Risk (06/17/2024)   Patient History    Smoking Tobacco Use: Former    Smokeless Tobacco Use: Never    Passive Exposure: Not on file  Financial Resource Strain: Low Risk (03/04/2024)   Overall Financial Resource Strain (CARDIA)    Difficulty of Paying Living Expenses: Not hard at all  Food Insecurity: No Food Insecurity (05/11/2024)   Epic    Worried About Programme Researcher, Broadcasting/film/video in the Last Year: Never true    Ran Out of Food in the Last Year: Never true  Transportation Needs: No Transportation Needs (05/11/2024)   Epic    Lack of Transportation (Medical): No    Lack of Transportation (Non-Medical): No  Physical Activity: Sufficiently Active (03/04/2024)   Exercise Vital Sign    Days of Exercise per Week: 5 days    Minutes of Exercise per Session: 30 min  Stress: No Stress Concern Present (03/04/2024)   [employer] of Occupational Health - Occupational Stress Questionnaire    Feeling of Stress: Not at all  Social Connections: Moderately Integrated (03/04/2024)   Social Connection and Isolation Panel    Frequency of Communication with Friends  and Family: More than three times a week    Frequency of Social Gatherings with Friends and Family: More than three times a week    Attends Religious Services: More than 4 times per year    Active Member of Clubs or Organizations: No    Attends Banker Meetings: Never    Marital Status: Married  Catering Manager Violence: Not At Risk (05/11/2024)   Epic    Fear of Current or Ex-Partner: No    Emotionally Abused: No    Physically Abused: No    Sexually Abused: No  Depression (PHQ2-9): Low Risk (08/12/2024)   Depression (PHQ2-9)    PHQ-2 Score: 0  Alcohol Screen: Low Risk (03/04/2024)   Alcohol Screen    Last Alcohol Screening Score (AUDIT): 0  Housing: Low Risk (05/11/2024)   Epic    Unable to Pay for Housing in the Last Year: No  Number of Times Moved in the Last Year: 0    Homeless in the Last Year: No  Utilities: Not At Risk (05/11/2024)   Epic    Threatened with loss of utilities: No  Health Literacy: Adequate Health Literacy (03/04/2024)   B1300 Health Literacy    Frequency of need for help with medical instructions: Never    FAMILY HISTORY: Family History  Problem Relation Age of Onset   Arthritis Mother    Arrhythmia Mother    Stroke Paternal Grandmother     ALLERGIES:  has no known allergies.  MEDICATIONS:  Current Outpatient Medications  Medication Sig Dispense Refill   allopurinol  (ZYLOPRIM ) 300 MG tablet Take 1 tablet (300 mg total) by mouth daily. 90 tablet 1   aspirin  EC 81 MG tablet Take 81 mg by mouth daily. Swallow whole.     atorvastatin  (LIPITOR) 10 MG tablet Take 1 tablet (10 mg total) by mouth daily. 90 tablet 3   cholecalciferol (VITAMIN D3) 25 MCG (1000 UNIT) tablet Take 1,000 Units by mouth daily.     fluticasone -salmeterol (ADVAIR DISKUS) 500-50 MCG/ACT AEPB Inhale 1 puff into the lungs in the morning and at bedtime. (Patient not taking: Reported on 05/11/2024) 60 each 6   guaiFENesin -codeine  100-10 MG/5ML syrup Take 10 mLs by mouth  every 6 (six) hours as needed for cough. 237 mL 0   hydrOXYzine  (ATARAX ) 10 MG tablet Take 1 tablet (10 mg total) by mouth 3 (three) times daily as needed. 30 tablet 0   lidocaine -prilocaine  (EMLA ) cream Apply 1 Application topically as needed. 30 g 0   losartan  (COZAAR ) 50 MG tablet Take 1 tablet (50 mg total) by mouth daily. 90 tablet 3   naproxen sodium (ALEVE) 220 MG tablet Take 220 mg by mouth daily as needed (for pain).     ondansetron  (ZOFRAN ) 8 MG tablet Take 1 tablet (8 mg total) by mouth every 8 (eight) hours as needed. 30 tablet 0   predniSONE  (DELTASONE ) 20 MG tablet Take 2 tablets (40 mg total) by mouth daily with breakfast. Take for 4 days. 8 tablet 0   prochlorperazine  (COMPAZINE ) 10 MG tablet Take 1 tablet (10 mg total) by mouth every 6 (six) hours as needed for nausea or vomiting. 30 tablet 0   zinc sulfate, 50mg  elemental zinc, 220 (50 Zn) MG capsule Take 220 mg by mouth daily.     No current facility-administered medications for this visit.   Facility-Administered Medications Ordered in Other Visits  Medication Dose Route Frequency Provider Last Rate Last Admin   DOXOrubicin  (ADRIAMYCIN ) chemo injection 52 mg  25 mg/m2 (Treatment Plan Recorded) Intravenous Once Geneve Kimpel T IV, MD   Stopped at 06/03/24 1605    REVIEW OF SYSTEMS:   Constitutional: ( - ) fevers, ( - )  chills , ( - ) night sweats Eyes: ( - ) blurriness of vision, ( - ) double vision, ( - ) watery eyes Ears, nose, mouth, throat, and face: ( - ) mucositis, ( - ) sore throat Respiratory: ( - ) cough, ( - ) dyspnea, ( - ) wheezes Cardiovascular: ( - ) palpitation, ( - ) chest discomfort, ( - ) lower extremity swelling Gastrointestinal:  ( - ) nausea, ( - ) heartburn, ( - ) change in bowel habits Skin: ( - ) abnormal skin rashes Lymphatics: ( - ) new lymphadenopathy, ( - ) easy bruising Neurological: ( - ) numbness, ( - ) tingling, ( - ) new weaknesses Behavioral/Psych: ( - )  mood change, ( - ) new changes   All other systems were reviewed with the patient and are negative.  PHYSICAL EXAMINATION:  Vitals:   08/12/24 0842  BP: 125/74  Pulse: 91  Resp: 18  Temp: 98 F (36.7 C)  SpO2: 99%   Filed Weights   08/12/24 0842  Weight: 208 lb 4.8 oz (94.5 kg)    GENERAL: well appearing elderly African-American male in NAD  SKIN: skin color, texture, turgor are normal, no rashes or significant lesions EYES: conjunctiva are pink and non-injected, sclera clear LUNGS: clear to auscultation and percussion with normal breathing effort HEART: regular rate & rhythm and no murmurs and no lower extremity edema Musculoskeletal: no cyanosis of digits and no clubbing  PSYCH: alert & oriented x 3, fluent speech NEURO: no focal motor/sensory deficits  LABORATORY DATA:  I have reviewed the data as listed    Latest Ref Rng & Units 08/12/2024    8:24 AM 07/29/2024    8:23 AM 07/16/2024   10:03 AM  CBC  WBC 4.0 - 10.5 K/uL 3.1  3.7  3.9   Hemoglobin 13.0 - 17.0 g/dL 87.4  88.2  88.3   Hematocrit 39.0 - 52.0 % 37.0  35.0  35.2   Platelets 150 - 400 K/uL 222  230  217        Latest Ref Rng & Units 08/12/2024    8:24 AM 07/29/2024    8:23 AM 07/16/2024   10:03 AM  CMP  Glucose 70 - 99 mg/dL 862  886  881   BUN 8 - 23 mg/dL 9  13  13    Creatinine 0.61 - 1.24 mg/dL 8.69  8.92  8.91   Sodium 135 - 145 mmol/L 143  142  144   Potassium 3.5 - 5.1 mmol/L 3.7  3.7  3.7   Chloride 98 - 111 mmol/L 107  107  110   CO2 22 - 32 mmol/L 25  25  24    Calcium  8.9 - 10.3 mg/dL 9.3  9.1  8.8   Total Protein 6.5 - 8.1 g/dL 7.2  7.1  7.0   Total Bilirubin 0.0 - 1.2 mg/dL 0.3  0.2  0.2   Alkaline Phos 38 - 126 U/L 69  71  72   AST 15 - 41 U/L 26  25  25    ALT 0 - 44 U/L 15  11  14       ASSESSMENT & PLAN Chase Moreno is a 74 y.o. male with medical history significant for sleep apnea with CPAP machine, type 2 diabetes, hypertension, prostate cancer, and allergic rhinitis who presents for evaluation of newly diagnosed  Hodgkin's lymphoma.    After review of the labs, review of the records, and discussion with the patient the patients findings are most consistent with a new diagnosis of Hodgkin's lymphoma stage IV.  Today we discussed the diagnosis, prognosis, and treatment moving forward.  He voices understanding of our findings and plan moving forward.  # Hodgkin's Lymphoma, Stage IV  -- Diagnosis confirmed with lymph node biopsy via bronchoscopy as well as PET CT scan. -- Treatment of choice for stage IV Hodgkin's lymphoma would be Nivo-AVD chemotherapy. --plan for repeat PET CT scan after 6 cycles of treatment.  PLAN: -- Today is cycle 3 day 15 of Nivo AVD.   -- Labs today show WBC 3.1, Hgb 12.5, MCV 82.6, Plt 222   --Proceed with treatment without any dose modifications. --RTC in 2 weeks with labs and  follow up prior to Cycle 4, Day 1  #Pruritus: --Flesh colored rash noted on arms and chest area.  --Advised to try topical benadryl  for spot treatment. Will send atarax  to help with itching.   #Supportive Care -- chemotherapy education completed -- port placed and then replaced on 06/18/2024.  --Echocardiogram adequate for treatment.  -- zofran  8mg  q8H PRN and compazine  10mg  PO q6H for nausea -- allopurinol  300mg  PO daily for TLS prophylaxis -- EMLA  cream for port -- no pain medication required at this time.    Orders Placed This Encounter  Procedures   CBC with Differential (Cancer Center Only)    Standing Status:   Future    Expected Date:   09/24/2024    Expiration Date:   09/24/2025   CMP (Cancer Center only)    Standing Status:   Future    Expected Date:   09/24/2024    Expiration Date:   09/24/2025   CBC with Differential (Cancer Center Only)    Standing Status:   Future    Expected Date:   10/08/2024    Expiration Date:   10/08/2025   CMP (Cancer Center only)    Standing Status:   Future    Expected Date:   10/08/2024    Expiration Date:   10/08/2025   CBC with Differential (Cancer  Center Only)    Standing Status:   Future    Expected Date:   10/22/2024    Expiration Date:   10/22/2025   CMP (Cancer Center only)    Standing Status:   Future    Expected Date:   10/22/2024    Expiration Date:   10/22/2025   T4    Standing Status:   Future    Expected Date:   10/22/2024    Expiration Date:   10/22/2025   TSH    Standing Status:   Future    Expected Date:   10/22/2024    Expiration Date:   10/22/2025   CBC with Differential (Cancer Center Only)    Standing Status:   Future    Expected Date:   11/05/2024    Expiration Date:   11/05/2025   CMP (Cancer Center only)    Standing Status:   Future    Expected Date:   11/05/2024    Expiration Date:   11/05/2025   All questions were answered. The patient knows to call the clinic with any problems, questions or concerns.  I have spent a total of 30 minutes minutes of face-to-face and non-face-to-face time, preparing to see the patient,  performing a medically appropriate examination, counseling and educating the patient, documenting clinical information in the electronic health record, independently interpreting results and communicating results to the patient, and care coordination.   Norleen IVAR Kidney, MD Department of Hematology/Oncology [facility] at [facility] Phone: [phone] Pager: [phone] Email: [email or web address]  08/12/2024 9:15 AM  "

## 2024-08-12 ENCOUNTER — Inpatient Hospital Stay: Payer: Medicare (Managed Care)

## 2024-08-12 ENCOUNTER — Inpatient Hospital Stay: Payer: Medicare (Managed Care) | Admitting: Hematology and Oncology

## 2024-08-12 VITALS — BP 125/74 | HR 91 | Temp 98.0°F | Resp 18 | Ht 67.0 in | Wt 208.3 lb

## 2024-08-12 DIAGNOSIS — Z5112 Encounter for antineoplastic immunotherapy: Secondary | ICD-10-CM | POA: Diagnosis not present

## 2024-08-12 DIAGNOSIS — Z95828 Presence of other vascular implants and grafts: Secondary | ICD-10-CM | POA: Diagnosis not present

## 2024-08-12 DIAGNOSIS — Z5111 Encounter for antineoplastic chemotherapy: Secondary | ICD-10-CM | POA: Diagnosis not present

## 2024-08-12 DIAGNOSIS — C8178 Other classical Hodgkin lymphoma, lymph nodes of multiple sites: Secondary | ICD-10-CM | POA: Diagnosis not present

## 2024-08-12 DIAGNOSIS — C8108 Nodular lymphocyte predominant Hodgkin lymphoma, lymph nodes of multiple sites: Secondary | ICD-10-CM

## 2024-08-12 LAB — CBC WITH DIFFERENTIAL (CANCER CENTER ONLY)
Abs Immature Granulocytes: 0.04 10*3/uL (ref 0.00–0.07)
Basophils Absolute: 0 10*3/uL (ref 0.0–0.1)
Basophils Relative: 1 %
Eosinophils Absolute: 0.2 10*3/uL (ref 0.0–0.5)
Eosinophils Relative: 6 %
HCT: 37 % — ABNORMAL LOW (ref 39.0–52.0)
Hemoglobin: 12.5 g/dL — ABNORMAL LOW (ref 13.0–17.0)
Immature Granulocytes: 1 %
Lymphocytes Relative: 41 %
Lymphs Abs: 1.3 10*3/uL (ref 0.7–4.0)
MCH: 27.9 pg (ref 26.0–34.0)
MCHC: 33.8 g/dL (ref 30.0–36.0)
MCV: 82.6 fL (ref 80.0–100.0)
Monocytes Absolute: 0.6 10*3/uL (ref 0.1–1.0)
Monocytes Relative: 18 %
Neutro Abs: 1 10*3/uL — ABNORMAL LOW (ref 1.7–7.7)
Neutrophils Relative %: 33 %
Platelet Count: 222 10*3/uL (ref 150–400)
RBC: 4.48 MIL/uL (ref 4.22–5.81)
RDW: 17.8 % — ABNORMAL HIGH (ref 11.5–15.5)
WBC Count: 3.1 10*3/uL — ABNORMAL LOW (ref 4.0–10.5)
nRBC: 0 % (ref 0.0–0.2)

## 2024-08-12 LAB — CMP (CANCER CENTER ONLY)
ALT: 15 U/L (ref 0–44)
AST: 26 U/L (ref 15–41)
Albumin: 4.2 g/dL (ref 3.5–5.0)
Alkaline Phosphatase: 69 U/L (ref 38–126)
Anion gap: 11 (ref 5–15)
BUN: 9 mg/dL (ref 8–23)
CO2: 25 mmol/L (ref 22–32)
Calcium: 9.3 mg/dL (ref 8.9–10.3)
Chloride: 107 mmol/L (ref 98–111)
Creatinine: 1.3 mg/dL — ABNORMAL HIGH (ref 0.61–1.24)
GFR, Estimated: 58 mL/min — ABNORMAL LOW
Glucose, Bld: 137 mg/dL — ABNORMAL HIGH (ref 70–99)
Potassium: 3.7 mmol/L (ref 3.5–5.1)
Sodium: 143 mmol/L (ref 135–145)
Total Bilirubin: 0.3 mg/dL (ref 0.0–1.2)
Total Protein: 7.2 g/dL (ref 6.5–8.1)

## 2024-08-12 MED ORDER — SODIUM CHLORIDE 0.9 % IV SOLN: INTRAVENOUS | Status: DC

## 2024-08-12 MED ORDER — VINBLASTINE SULFATE CHEMO INJECTION 1 MG/ML
6.0000 mg/m2 | Freq: Once | INTRAVENOUS | Status: AC
  Administered 2024-08-12: 12.2 mg via INTRAVENOUS
  Filled 2024-08-12: qty 12.2

## 2024-08-12 MED ORDER — DOXORUBICIN HCL CHEMO IV INJECTION 2 MG/ML
25.0000 mg/m2 | Freq: Once | INTRAVENOUS | Status: AC
  Administered 2024-08-12: 52 mg via INTRAVENOUS
  Filled 2024-08-12: qty 26

## 2024-08-12 MED ORDER — DEXAMETHASONE SOD PHOSPHATE PF 10 MG/ML IJ SOLN
10.0000 mg | Freq: Once | INTRAMUSCULAR | Status: AC
  Administered 2024-08-12: 10 mg via INTRAVENOUS
  Filled 2024-08-12: qty 1

## 2024-08-12 MED ORDER — PALONOSETRON HCL INJECTION 0.25 MG/5ML
0.2500 mg | Freq: Once | INTRAVENOUS | Status: AC
  Administered 2024-08-12: 0.25 mg via INTRAVENOUS
  Filled 2024-08-12: qty 5

## 2024-08-12 MED ORDER — SODIUM CHLORIDE 0.9 % IV SOLN
150.0000 mg | Freq: Once | INTRAVENOUS | Status: AC
  Administered 2024-08-12: 150 mg via INTRAVENOUS
  Filled 2024-08-12: qty 150

## 2024-08-12 MED ORDER — SODIUM CHLORIDE 0.9 % IV SOLN
240.0000 mg | Freq: Once | INTRAVENOUS | Status: AC
  Administered 2024-08-12: 240 mg via INTRAVENOUS
  Filled 2024-08-12: qty 24

## 2024-08-12 MED ORDER — SODIUM CHLORIDE 0.9 % IV SOLN
375.0000 mg/m2 | Freq: Once | INTRAVENOUS | Status: AC
  Administered 2024-08-12: 800 mg via INTRAVENOUS
  Filled 2024-08-12: qty 80

## 2024-08-12 NOTE — Patient Instructions (Signed)
 [facility] - A DEPT OF [facility]  Discharge Instructions: Thank you for choosing [facility] to provide your oncology and hematology care.   If you have a lab appointment with the Cancer Center, please go directly to the Cancer Center and check in at the registration area.   Wear comfortable clothing and clothing appropriate for easy access to any Portacath or PICC line.   We strive to give you quality time with your provider. You may need to reschedule your appointment if you arrive late (15 or more minutes).  Arriving late affects you and other patients whose appointments are after yours.  Also, if you miss three or more appointments without notifying the office, you may be dismissed from the clinic at the providers discretion.      For prescription refill requests, have your pharmacy contact our office and allow 72 hours for refills to be completed.    Today you received the following chemotherapy and/or immunotherapy agents: Doxorubicin , Vinblastine , Dacarbazine , Nivolumab       To help prevent nausea and vomiting after your treatment, we encourage you to take your nausea medication as directed.  BELOW ARE SYMPTOMS THAT SHOULD BE REPORTED IMMEDIATELY: *FEVER GREATER THAN 100.4 F (38 C) OR HIGHER *CHILLS OR SWEATING *NAUSEA AND VOMITING THAT IS NOT CONTROLLED WITH YOUR NAUSEA MEDICATION *UNUSUAL SHORTNESS OF BREATH *UNUSUAL BRUISING OR BLEEDING *URINARY PROBLEMS (pain or burning when urinating, or frequent urination) *BOWEL PROBLEMS (unusual diarrhea, constipation, pain near the anus) TENDERNESS IN MOUTH AND THROAT WITH OR WITHOUT PRESENCE OF ULCERS (sore throat, sores in mouth, or a toothache) UNUSUAL RASH, SWELLING OR PAIN  UNUSUAL VAGINAL DISCHARGE OR ITCHING   Items with * indicate a potential emergency and should be followed up as soon as possible or go to the Emergency Department if any problems should occur.  Please show the  CHEMOTHERAPY ALERT CARD or IMMUNOTHERAPY ALERT CARD at check-in to the Emergency Department and triage nurse.  Should you have questions after your visit or need to cancel or reschedule your appointment, please contact [facility] - A DEPT OF [facility]  Dept: [phone]  and follow the prompts.  Office hours are 8:00 a.m. to 4:30 p.m. Monday - Friday. Please note that voicemails left after 4:00 p.m. may not be returned until the following business day.  We are closed weekends and major holidays. You have access to a nurse at all times for urgent questions. Please call the main number to the clinic Dept: [phone] and follow the prompts.   For any non-urgent questions, you may also contact your provider using MyChart. We now offer e-Visits for anyone 71 and older to request care online for non-urgent symptoms. For details visit [email or web address].   Also download the MyChart app! Go to the app store, search MyChart, open the app, select [place], and log in with your MyChart username and password.

## 2024-08-13 ENCOUNTER — Inpatient Hospital Stay: Payer: Medicare (Managed Care)

## 2024-08-13 ENCOUNTER — Inpatient Hospital Stay: Payer: Medicare (Managed Care) | Admitting: Hematology and Oncology

## 2024-08-27 ENCOUNTER — Inpatient Hospital Stay: Payer: Medicare (Managed Care) | Admitting: Hematology and Oncology

## 2024-08-27 ENCOUNTER — Inpatient Hospital Stay: Payer: Medicare (Managed Care)

## 2024-08-31 ENCOUNTER — Inpatient Hospital Stay: Payer: Medicare (Managed Care)

## 2024-08-31 ENCOUNTER — Inpatient Hospital Stay: Payer: Medicare (Managed Care) | Admitting: Physician Assistant

## 2024-09-10 ENCOUNTER — Inpatient Hospital Stay: Payer: Medicare (Managed Care)

## 2024-09-10 ENCOUNTER — Inpatient Hospital Stay: Payer: Medicare (Managed Care) | Admitting: Hematology and Oncology

## 2024-09-13 ENCOUNTER — Inpatient Hospital Stay: Payer: Medicare (Managed Care) | Admitting: Hematology and Oncology

## 2024-09-13 ENCOUNTER — Inpatient Hospital Stay: Payer: Medicare (Managed Care)

## 2024-09-23 ENCOUNTER — Inpatient Hospital Stay: Payer: Medicare (Managed Care)

## 2024-09-23 ENCOUNTER — Inpatient Hospital Stay: Payer: Medicare (Managed Care) | Admitting: Hematology and Oncology

## 2024-09-24 ENCOUNTER — Inpatient Hospital Stay: Payer: Medicare (Managed Care) | Admitting: Hematology and Oncology

## 2024-09-24 ENCOUNTER — Inpatient Hospital Stay: Payer: Medicare (Managed Care)

## 2025-03-07 ENCOUNTER — Ambulatory Visit: Payer: Medicare (Managed Care)

## 2025-03-08 ENCOUNTER — Ambulatory Visit: Payer: Medicare (Managed Care)
